# Patient Record
Sex: Female | Born: 1950
Health system: Southern US, Community
[De-identification: ages and names within clinical notes are randomized; demographics above are authoritative.]

## PROBLEM LIST (undated history)

## (undated) DIAGNOSIS — E669 Obesity, unspecified: Secondary | ICD-10-CM

## (undated) DIAGNOSIS — Z8249 Family history of ischemic heart disease and other diseases of the circulatory system: Secondary | ICD-10-CM

## (undated) DIAGNOSIS — R413 Other amnesia: Secondary | ICD-10-CM

## (undated) DIAGNOSIS — I10 Essential (primary) hypertension: Secondary | ICD-10-CM

## (undated) DIAGNOSIS — E7800 Pure hypercholesterolemia, unspecified: Secondary | ICD-10-CM

## (undated) HISTORY — PX: CARPAL TUNNEL RELEASE: SHX101

## (undated) HISTORY — PX: HIP SURGERY: SHX245

## (undated) HISTORY — DX: Other amnesia: R41.3

---

## 1998-12-10 ENCOUNTER — Other Ambulatory Visit: Admission: RE | Admit: 1998-12-10 | Discharge: 1998-12-10 | Payer: Self-pay | Admitting: Family Medicine

## 2000-07-07 ENCOUNTER — Other Ambulatory Visit: Admission: RE | Admit: 2000-07-07 | Discharge: 2000-07-07 | Payer: Self-pay | Admitting: Family Medicine

## 2001-08-23 ENCOUNTER — Other Ambulatory Visit: Admission: RE | Admit: 2001-08-23 | Discharge: 2001-08-23 | Payer: Self-pay | Admitting: Family Medicine

## 2001-08-26 ENCOUNTER — Encounter: Payer: Self-pay | Admitting: Family Medicine

## 2001-08-26 ENCOUNTER — Encounter: Admission: RE | Admit: 2001-08-26 | Discharge: 2001-08-26 | Payer: Self-pay | Admitting: Family Medicine

## 2002-09-08 ENCOUNTER — Other Ambulatory Visit: Admission: RE | Admit: 2002-09-08 | Discharge: 2002-09-08 | Payer: Self-pay | Admitting: Family Medicine

## 2003-10-13 ENCOUNTER — Other Ambulatory Visit: Admission: RE | Admit: 2003-10-13 | Discharge: 2003-10-13 | Payer: Self-pay | Admitting: Family Medicine

## 2004-04-15 ENCOUNTER — Ambulatory Visit: Payer: Self-pay | Admitting: Family Medicine

## 2004-09-07 ENCOUNTER — Ambulatory Visit: Payer: Self-pay | Admitting: Family Medicine

## 2005-01-22 ENCOUNTER — Ambulatory Visit: Payer: Self-pay | Admitting: Family Medicine

## 2005-03-05 ENCOUNTER — Other Ambulatory Visit: Admission: RE | Admit: 2005-03-05 | Discharge: 2005-03-05 | Payer: Self-pay | Admitting: Family Medicine

## 2005-03-05 ENCOUNTER — Ambulatory Visit: Payer: Self-pay | Admitting: Family Medicine

## 2005-04-16 ENCOUNTER — Ambulatory Visit: Payer: Self-pay | Admitting: Internal Medicine

## 2007-02-17 ENCOUNTER — Ambulatory Visit: Payer: Self-pay | Admitting: Family Medicine

## 2007-02-17 LAB — CONVERTED CEMR LAB
AST: 28 units/L (ref 0–37)
Alkaline Phosphatase: 109 units/L (ref 39–117)
BUN: 22 mg/dL (ref 6–23)
Basophils Absolute: 0 10*3/uL (ref 0.0–0.1)
Basophils Relative: 0 % (ref 0.0–1.0)
Bilirubin Urine: NEGATIVE
CO2: 32 meq/L (ref 19–32)
Calcium: 10.3 mg/dL (ref 8.4–10.5)
Chloride: 105 meq/L (ref 96–112)
Cholesterol: 234 mg/dL (ref 0–200)
Direct LDL: 171.6 mg/dL
Eosinophils Absolute: 0.2 10*3/uL (ref 0.0–0.6)
GFR calc Af Amer: 95 mL/min
GFR calc non Af Amer: 79 mL/min
Glucose, Bld: 79 mg/dL (ref 70–99)
Glucose, Urine, Semiquant: NEGATIVE
Lymphocytes Relative: 31.3 % (ref 12.0–46.0)
MCHC: 33 g/dL (ref 30.0–36.0)
MCV: 72.7 fL — ABNORMAL LOW (ref 78.0–100.0)
Monocytes Relative: 7.9 % (ref 3.0–11.0)
Neutro Abs: 4.1 10*3/uL (ref 1.4–7.7)
Potassium: 3.7 meq/L (ref 3.5–5.1)
Total CHOL/HDL Ratio: 4.7
Total Protein: 7.2 g/dL (ref 6.0–8.3)
Triglycerides: 91 mg/dL (ref 0–149)
Urobilinogen, UA: 0.2
VLDL: 18 mg/dL (ref 0–40)
WBC: 7.1 10*3/uL (ref 4.5–10.5)

## 2007-02-18 DIAGNOSIS — I10 Essential (primary) hypertension: Secondary | ICD-10-CM

## 2007-02-18 HISTORY — DX: Essential (primary) hypertension: I10

## 2007-02-22 ENCOUNTER — Other Ambulatory Visit: Admission: RE | Admit: 2007-02-22 | Discharge: 2007-02-22 | Payer: Self-pay | Admitting: Family Medicine

## 2007-02-22 ENCOUNTER — Ambulatory Visit: Payer: Self-pay | Admitting: Family Medicine

## 2007-02-22 ENCOUNTER — Encounter: Payer: Self-pay | Admitting: Family Medicine

## 2007-02-22 DIAGNOSIS — E669 Obesity, unspecified: Secondary | ICD-10-CM | POA: Insufficient documentation

## 2007-02-22 DIAGNOSIS — J309 Allergic rhinitis, unspecified: Secondary | ICD-10-CM

## 2007-02-22 HISTORY — DX: Allergic rhinitis, unspecified: J30.9

## 2007-03-22 ENCOUNTER — Encounter: Payer: Self-pay | Admitting: Family Medicine

## 2007-04-22 ENCOUNTER — Ambulatory Visit: Payer: Self-pay | Admitting: Family Medicine

## 2007-04-22 LAB — CONVERTED CEMR LAB
ALT: 40 units/L — ABNORMAL HIGH (ref 0–35)
AST: 31 units/L (ref 0–37)
Basophils Relative: 0.1 % (ref 0.0–1.0)
Direct LDL: 179.4 mg/dL
Eosinophils Absolute: 0.2 10*3/uL (ref 0.0–0.6)
Eosinophils Relative: 1.9 % (ref 0.0–5.0)
MCHC: 32.3 g/dL (ref 30.0–36.0)
MCV: 73.6 fL — ABNORMAL LOW (ref 78.0–100.0)
Monocytes Absolute: 0.7 10*3/uL (ref 0.2–0.7)
Monocytes Relative: 8.1 % (ref 3.0–11.0)
Neutro Abs: 5.1 10*3/uL (ref 1.4–7.7)
RBC: 5.11 M/uL (ref 3.87–5.11)
Total CHOL/HDL Ratio: 4.1

## 2007-05-11 ENCOUNTER — Ambulatory Visit: Payer: Self-pay | Admitting: Family Medicine

## 2007-07-22 ENCOUNTER — Ambulatory Visit: Payer: Self-pay | Admitting: Family Medicine

## 2007-07-22 DIAGNOSIS — E785 Hyperlipidemia, unspecified: Secondary | ICD-10-CM

## 2007-07-22 HISTORY — DX: Hyperlipidemia, unspecified: E78.5

## 2007-07-22 LAB — CONVERTED CEMR LAB
ALT: 48 units/L — ABNORMAL HIGH (ref 0–35)
Albumin: 3.8 g/dL (ref 3.5–5.2)
Alkaline Phosphatase: 71 units/L (ref 39–117)
Cholesterol: 163 mg/dL (ref 0–200)
Total Bilirubin: 1.1 mg/dL (ref 0.3–1.2)
Total CHOL/HDL Ratio: 3.2
Total Protein: 7 g/dL (ref 6.0–8.3)
Triglycerides: 61 mg/dL (ref 0–149)
VLDL: 12 mg/dL (ref 0–40)

## 2007-07-26 ENCOUNTER — Telehealth: Payer: Self-pay | Admitting: Family Medicine

## 2007-07-27 ENCOUNTER — Telehealth: Payer: Self-pay | Admitting: Family Medicine

## 2007-08-07 ENCOUNTER — Telehealth: Payer: Self-pay | Admitting: Family Medicine

## 2008-01-19 ENCOUNTER — Telehealth (INDEPENDENT_AMBULATORY_CARE_PROVIDER_SITE_OTHER): Payer: Self-pay | Admitting: *Deleted

## 2008-01-26 ENCOUNTER — Telehealth: Payer: Self-pay | Admitting: Family Medicine

## 2008-03-08 ENCOUNTER — Encounter: Admission: RE | Admit: 2008-03-08 | Discharge: 2008-03-08 | Payer: Self-pay | Admitting: Family Medicine

## 2008-03-14 ENCOUNTER — Ambulatory Visit: Payer: Self-pay | Admitting: Gastroenterology

## 2008-03-16 ENCOUNTER — Telehealth: Payer: Self-pay | Admitting: Gastroenterology

## 2008-03-17 ENCOUNTER — Telehealth: Payer: Self-pay | Admitting: Gastroenterology

## 2008-03-20 ENCOUNTER — Telehealth: Payer: Self-pay | Admitting: Gastroenterology

## 2008-03-21 ENCOUNTER — Telehealth: Payer: Self-pay | Admitting: Internal Medicine

## 2008-03-22 ENCOUNTER — Ambulatory Visit: Payer: Self-pay | Admitting: Gastroenterology

## 2008-03-23 ENCOUNTER — Ambulatory Visit: Payer: Self-pay | Admitting: Family Medicine

## 2008-03-23 LAB — CONVERTED CEMR LAB
ALT: 26 units/L (ref 0–35)
BUN: 11 mg/dL (ref 6–23)
Basophils Relative: 0.9 % (ref 0.0–3.0)
Bilirubin, Direct: 0.2 mg/dL (ref 0.0–0.3)
Blood in Urine, dipstick: NEGATIVE
CO2: 34 meq/L — ABNORMAL HIGH (ref 19–32)
Eosinophils Relative: 3.6 % (ref 0.0–5.0)
GFR calc non Af Amer: 78 mL/min
Glucose, Urine, Semiquant: NEGATIVE
HDL: 44.1 mg/dL (ref >39.0)
Hemoglobin: 13 g/dL (ref 12.0–15.0)
Ketones, urine, test strip: NEGATIVE
MCHC: 32.4 g/dL (ref 30.0–36.0)
MCV: 73.9 fL — ABNORMAL LOW (ref 78.0–100.0)
Monocytes Absolute: 0.6 10*3/uL (ref 0.1–1.0)
Potassium: 3.3 meq/L — ABNORMAL LOW (ref 3.5–5.1)
RBC: 5.43 M/uL — ABNORMAL HIGH (ref 3.87–5.11)
Sodium: 144 meq/L (ref 135–145)
TSH: 1.86 microintl units/mL (ref 0.35–5.50)
Total Bilirubin: 1 mg/dL (ref 0.3–1.2)
Total Protein: 7.5 g/dL (ref 6.0–8.3)
Urobilinogen, UA: 1
VLDL: 18 mg/dL (ref 0–40)
WBC: 6.6 10*3/uL (ref 4.5–10.5)
pH: 7

## 2008-03-30 ENCOUNTER — Other Ambulatory Visit: Admission: RE | Admit: 2008-03-30 | Discharge: 2008-03-30 | Payer: Self-pay | Admitting: Family Medicine

## 2008-03-30 ENCOUNTER — Encounter: Payer: Self-pay | Admitting: Family Medicine

## 2008-03-30 ENCOUNTER — Ambulatory Visit: Payer: Self-pay | Admitting: Family Medicine

## 2008-03-30 DIAGNOSIS — M1990 Unspecified osteoarthritis, unspecified site: Secondary | ICD-10-CM | POA: Insufficient documentation

## 2008-03-30 HISTORY — DX: Unspecified osteoarthritis, unspecified site: M19.90

## 2008-04-10 ENCOUNTER — Inpatient Hospital Stay (HOSPITAL_COMMUNITY): Admission: RE | Admit: 2008-04-10 | Discharge: 2008-04-15 | Payer: Self-pay | Admitting: Orthopaedic Surgery

## 2008-05-17 ENCOUNTER — Ambulatory Visit (HOSPITAL_COMMUNITY): Admission: EM | Admit: 2008-05-17 | Discharge: 2008-05-17 | Payer: Self-pay | Admitting: Emergency Medicine

## 2009-03-19 ENCOUNTER — Ambulatory Visit: Payer: Self-pay | Admitting: Family Medicine

## 2009-03-19 ENCOUNTER — Encounter: Admission: RE | Admit: 2009-03-19 | Discharge: 2009-03-19 | Payer: Self-pay | Admitting: Family Medicine

## 2009-03-19 LAB — CONVERTED CEMR LAB
ALT: 37 units/L — ABNORMAL HIGH (ref 0–35)
Alkaline Phosphatase: 77 units/L (ref 39–117)
BUN: 14 mg/dL (ref 6–23)
Basophils Absolute: 0.1 10*3/uL (ref 0.0–0.1)
Basophils Relative: 1.6 % (ref 0.0–3.0)
Chloride: 99 meq/L (ref 96–112)
Creatinine,U: 168.7 mg/dL
Eosinophils Absolute: 0.2 10*3/uL (ref 0.0–0.7)
Eosinophils Relative: 2.4 % (ref 0.0–5.0)
GFR calc non Af Amer: 94.12 mL/min (ref >60)
Glucose, Bld: 97 mg/dL (ref 70–99)
Glucose, Urine, Semiquant: NEGATIVE
HCT: 40 % (ref 36.0–46.0)
HDL: 53.4 mg/dL (ref >39.00)
Hemoglobin: 12.8 g/dL (ref 12.0–15.0)
Hgb A1c MFr Bld: 6 % (ref 4.6–6.5)
Ketones, urine, test strip: NEGATIVE
Lymphs Abs: 1.9 10*3/uL (ref 0.7–4.0)
MCV: 74.5 fL — ABNORMAL LOW (ref 78.0–100.0)
Microalb, Ur: 24.2 mg/dL — ABNORMAL HIGH (ref 0.0–1.9)
Monocytes Absolute: 0.5 10*3/uL (ref 0.1–1.0)
Monocytes Relative: 7 % (ref 3.0–12.0)
Nitrite: NEGATIVE
Platelets: 182 10*3/uL (ref 150.0–400.0)
RBC: 5.38 M/uL — ABNORMAL HIGH (ref 3.87–5.11)
RDW: 14.4 % (ref 11.5–14.6)
Sodium: 144 meq/L (ref 135–145)
Total CHOL/HDL Ratio: 3
Total Protein: 7.7 g/dL (ref 6.0–8.3)
Urobilinogen, UA: 0.2
WBC Urine, dipstick: NEGATIVE
WBC: 6.7 10*3/uL (ref 4.5–10.5)

## 2009-03-27 ENCOUNTER — Encounter: Admission: RE | Admit: 2009-03-27 | Discharge: 2009-03-27 | Payer: Self-pay | Admitting: Family Medicine

## 2009-04-17 ENCOUNTER — Other Ambulatory Visit: Admission: RE | Admit: 2009-04-17 | Discharge: 2009-04-17 | Payer: Self-pay | Admitting: Family Medicine

## 2009-04-17 ENCOUNTER — Ambulatory Visit: Payer: Self-pay | Admitting: Family Medicine

## 2009-04-17 ENCOUNTER — Encounter: Payer: Self-pay | Admitting: Family Medicine

## 2009-04-17 ENCOUNTER — Encounter (INDEPENDENT_AMBULATORY_CARE_PROVIDER_SITE_OTHER): Payer: Self-pay | Admitting: *Deleted

## 2009-04-27 ENCOUNTER — Ambulatory Visit: Payer: Self-pay | Admitting: Family Medicine

## 2010-03-15 ENCOUNTER — Encounter: Admission: RE | Admit: 2010-03-15 | Discharge: 2010-03-15 | Payer: Self-pay | Admitting: Family Medicine

## 2010-05-01 ENCOUNTER — Ambulatory Visit: Payer: Self-pay | Admitting: Family Medicine

## 2010-05-01 LAB — CONVERTED CEMR LAB
ALT: 26 units/L (ref 0–35)
AST: 28 units/L (ref 0–37)
Albumin: 4.1 g/dL (ref 3.5–5.2)
BUN: 14 mg/dL (ref 6–23)
Basophils Absolute: 0.1 10*3/uL (ref 0.0–0.1)
Bilirubin, Direct: 0.2 mg/dL (ref 0.0–0.3)
Blood in Urine, dipstick: NEGATIVE
Cholesterol: 147 mg/dL (ref 0–200)
Creatinine, Ser: 0.89 mg/dL (ref 0.40–1.20)
Eosinophils Relative: 3.8 % (ref 0.0–5.0)
Ketones, urine, test strip: NEGATIVE
Lymphocytes Relative: 32.3 % (ref 12.0–46.0)
MCHC: 32.8 g/dL (ref 30.0–36.0)
Monocytes Absolute: 0.6 10*3/uL (ref 0.1–1.0)
Monocytes Relative: 8 % (ref 3.0–12.0)
Neutro Abs: 4 10*3/uL (ref 1.4–7.7)
Nitrite: NEGATIVE
Platelets: 188 10*3/uL (ref 150.0–400.0)
Potassium: 3.4 meq/L — ABNORMAL LOW (ref 3.5–5.3)
RDW: 15.3 % — ABNORMAL HIGH (ref 11.5–14.6)
Sodium: 142 meq/L (ref 135–145)
Total Bilirubin: 1.1 mg/dL (ref 0.3–1.2)
Total Protein: 7 g/dL (ref 6.0–8.3)
Triglycerides: 67 mg/dL (ref <150)
WBC: 7.2 10*3/uL (ref 4.5–10.5)

## 2010-05-08 ENCOUNTER — Other Ambulatory Visit
Admission: RE | Admit: 2010-05-08 | Discharge: 2010-05-08 | Payer: Self-pay | Source: Home / Self Care | Admitting: Family Medicine

## 2010-05-08 ENCOUNTER — Ambulatory Visit: Payer: Self-pay | Admitting: Family Medicine

## 2010-05-08 ENCOUNTER — Encounter: Payer: Self-pay | Admitting: Family Medicine

## 2010-05-08 LAB — CONVERTED CEMR LAB: Pap Smear: NEGATIVE

## 2010-07-11 NOTE — Assessment & Plan Note (Signed)
Summary: cpx labs v70.0/njr   Nurse Visit   Allergies: 1)  ! Pcn Laboratory Results   Urine Tests  Date/Time Received: May 01, 2010 2:46 PM  Date/Time Reported: May 01, 2010 2:46 PM   Routine Urinalysis   Color: yellow Appearance: Clear Glucose: negative   (Normal Range: Negative) Bilirubin: negative   (Normal Range: Negative) Ketone: negative   (Normal Range: Negative) Spec. Gravity: 1.025   (Normal Range: 1.003-1.035) Blood: negative   (Normal Range: Negative) pH: 6.5   (Normal Range: 5.0-8.0) Protein: 2+   (Normal Range: Negative) Urobilinogen: 0.2   (Normal Range: 0-1) Nitrite: negative   (Normal Range: Negative) Leukocyte Esterace: 1+   (Normal Range: Negative)    Comments: Wynona Canes, CMA  May 01, 2010 2:46 PM     Orders Added: 1)  Venipuncture [21308] 2)  Specimen Handling [99000] 3)  TLB-CBC Platelet - w/Differential [85025-CBCD] 4)  UA Dipstick w/o Micro (automated)  [81003] 5)  T- * Misc. Laboratory test [phone]  Laboratory Results   Urine Tests    Routine Urinalysis   Color: yellow Appearance: Clear Glucose: negative   (Normal Range: Negative) Bilirubin: negative   (Normal Range: Negative) Ketone: negative   (Normal Range: Negative) Spec. Gravity: 1.025   (Normal Range: 1.003-1.035) Blood: negative   (Normal Range: Negative) pH: 6.5   (Normal Range: 5.0-8.0) Protein: 2+   (Normal Range: Negative) Urobilinogen: 0.2   (Normal Range: 0-1) Nitrite: negative   (Normal Range: Negative) Leukocyte Esterace: 1+   (Normal Range: Negative)    Comments: Wynona Canes, CMA  May 01, 2010 2:46 PM

## 2010-07-11 NOTE — Assessment & Plan Note (Signed)
Summary: cpx/njr   Vital Signs:  Patient profile:   60 year old female Menstrual status:  postmenopausal Height:      61.5 inches Weight:      210 pounds BMI:     39.18 Temp:     98.3 degrees F oral BP sitting:   120 / 78  (left arm) Cuff size:   regular  Vitals Entered By: Kern Reap CMA Duncan Dull) (May 08, 2010 10:29 AM) CC: cpx   CC:  cpx.  History of Present Illness: Brooke [name] is a 60 year old female, who was laid off from her job last year, who comes in today for general physical examination because of a history of underlying hypertension and hyperlipidemia and obesity.  She is currently taking Zocor 20 mg nightly for hyperlipidemia, and one aspirin tablet.  Lipids are at goal.  She takes Tenoretic 100 -- 25   also, potassium supplement daily for hypertension//////////////?// she can drop the dose  Her weight continues to be a problem.  She is 210 pounds.  She is not dieting nor is she walking daily.  Despite being out of work.  Review of systems she gets routine eye care, dental care, colonoscopy, and GI 2009 normal, mammography October 2011 normal.  Tetanus 2009, seasonal flu shot today  Allergies: 1)  ! Pcn  Past History:  Past medical, surgical, family and social histories (including risk factors) reviewed, and no changes noted (except as noted below).  Past Medical History: Reviewed history from 03/30/2008 and no changes required. HRT Obese High Cholesterol Hypertension Osteoarthritis left hip  Past Surgical History: Reviewed history from 04/17/2009 and no changes required. CB x2 THR...L 09  Family History: Reviewed history from 02/18/2007 and no changes required. Family History of Colon CA 1st degree relative <60 Family History Kidney disease Family History of Cardiovascular disorder Family History of Respiratory disease  Social History: Reviewed history from 04/17/2009 and no changes required. Occupation: Never Smoked Alcohol  use-no Married  Review of Systems      See HPI       Flu Vaccine Consent Questions     Do you have a history of severe allergic reactions to this vaccine? no    Any prior history of allergic reactions to egg and/or gelatin? no    Do you have a sensitivity to the preservative Thimersol? no    Do you have a past history of Guillan-Barre Syndrome? no    Do you currently have an acute febrile illness? no    Have you ever had a severe reaction to latex? no    Vaccine information given and explained to patient? yes    Are you currently pregnant? no    Lot Number:[id number]   Exp Date:12/07/2010   Site Given Right  Deltoid IM   Physical Exam  General:  Well-developed,well-nourished,in no acute distress; alert,appropriate and cooperative throughout examination Head:  Normocephalic and atraumatic without obvious abnormalities. No apparent alopecia or balding. Eyes:  No corneal or conjunctival inflammation noted. EOMI. Perrla. Funduscopic exam benign, without hemorrhages, exudates or papilledema. Vision grossly normal. Ears:  External ear exam shows no significant lesions or deformities.  Otoscopic examination reveals clear canals, tympanic membranes are intact bilaterally without bulging, retraction, inflammation or discharge. Hearing is grossly normal bilaterally. Nose:  External nasal examination shows no deformity or inflammation. Nasal mucosa are pink and moist without lesions or exudates. Mouth:  Oral mucosa and oropharynx without lesions or exudates.  Teeth in good repair. Neck:  No deformities, masses, or tenderness  noted. Chest Wall:  No deformities, masses, or tenderness noted. Breasts:  No mass, nodules, thickening, tenderness, bulging, retraction, inflamation, nipple discharge or skin changes noted.   Lungs:  Normal respiratory effort, chest expands symmetrically. Lungs are clear to auscultation, no crackles or wheezes. Heart:  Normal rate and regular rhythm. S1 and S2 normal without  gallop, murmur, click, rub or other extra sounds. Abdomen:  Bowel sounds positive,abdomen soft and non-tender without masses, organomegaly or hernias noted. Rectal:  No external abnormalities noted. Normal sphincter tone. No rectal masses or tenderness. Genitalia:  Pelvic Exam:        External: normal female genitalia without lesions or masses        Vagina: normal without lesions or masses        Cervix: normal without lesions or masses        Adnexa: normal bimanual exam without masses or fullness        Uterus: normal by palpation        Pap smear: performed Msk:  No deformity or scoliosis noted of thoracic or lumbar spine.   Pulses:  R and L carotid,radial,femoral,dorsalis pedis and posterior tibial pulses are full and equal bilaterally Extremities:  No clubbing, cyanosis, edema, or deformity noted with normal full range of motion of all joints.   Neurologic:  No cranial nerve deficits noted. Station and gait are normal. Plantar reflexes are down-going bilaterally. DTRs are symmetrical throughout. Sensory, motor and coordinative functions appear intact. Skin:  Intact without suspicious lesions or rashes Cervical Nodes:  No lymphadenopathy noted Axillary Nodes:  No palpable lymphadenopathy Inguinal Nodes:  No significant adenopathy Psych:  Cognition and judgment appear intact. Alert and cooperative with normal attention span and concentration. No apparent delusions, illusions, hallucinations   Impression & Recommendations:  Problem # 1:  HYPERLIPIDEMIA (ICD-272.4) Assessment Improved  Her updated medication list for this problem includes:    Zocor 20 Mg Tabs (Simvastatin) .Marland Kitchen... Take one tab once daily  Orders: EKG w/ Interpretation (93000)  Problem # 2:  OBESITY NOS (ICD-278.00) Assessment: Unchanged  Problem # 3:  HYPERTENSION (ICD-401.9) Assessment: Improved  Her updated medication list for this problem includes:    Atenolol-chlorthalidone 100-25 Mg Tabs  (Atenolol-chlorthalidone) .Marland Kitchen... Take one tab once daily  Orders: EKG w/ Interpretation (93000)  Problem # 4:  PHYSICAL EXAMINATION (ICD-V70.0) Assessment: Unchanged  Orders: EKG w/ Interpretation (93000)  Complete Medication List: 1)  Atenolol-chlorthalidone 100-25 Mg Tabs (Atenolol-chlorthalidone) .... Take one tab once daily 2)  Klor-con M20 20 Meq Tbcr (Potassium chloride crys cr) .... Take one tab once daily 3)  Adult Aspirin Low Strength 81 Mg Tbdp (Aspirin) .... Two times a day 4)  Zyrtec Allergy 10 Mg Tabs (Cetirizine hcl) 5)  Zocor 20 Mg Tabs (Simvastatin) .... Take one tab once daily 6)  Otc Fe  7)  Calcium  8)  Unisom 25 Mg Tabs (Doxylamine succinate (sleep)) .... As needed 9)  Fish Oil 1200 Mg Caps (Omega-3 fatty acids) .... Take one tab once daily 10)  Flax Seed Oil 1000 Mg Caps (Flaxseed (linseed)) .... Once daily 11)  Daily Multi Tabs (Multiple vitamins-minerals) .... Take half in the am and half in the pm 12)  Super B Complex Tabs (B complex-c) .... Take one tab once daily 13)  Vitamin D3 3000 Unit Tabs (Cholecalciferol) .... Once daily 14)  Coq10 30 Mg Caps (Coenzyme q10) .... Once daily 15)  Magnesium 250 Mg Tabs (Magnesium) .... Once daily  Other Orders: Admin 1st Vaccine (16109)  Flu Vaccine 66yrs + (10272)  Patient Instructions: 1)  begin a walking program 20 minutes daily. 2)  Cut your blood pressure medication in half and check your blood pressure daily for 4 weeks in a row.  If your blood pressure stays 135/85 or less than continue   half a tablet a day.  If however, your blood pressure goes up re-start a full tablet daily 3)  Please schedule a follow-up appointment in 1 year. Prescriptions: ZOCOR 20 MG  TABS (SIMVASTATIN) take one tab once daily  #100 x 3   Entered and Authorized by:   Roderick Pee MD   Signed by:   Roderick Pee MD on 05/08/2010   Method used:   Print then Give to Patient   RxID:   [id number] KLOR-CON M20 20 MEQ TBCR  (POTASSIUM CHLORIDE CRYS CR) take one tab once daily  #100 Tablet x 2   Entered and Authorized by:   Roderick Pee MD   Signed by:   Roderick Pee MD on 05/08/2010   Method used:   Print then Give to Patient   RxID:   [id number] ATENOLOL-CHLORTHALIDONE 100-25 MG TABS (ATENOLOL-CHLORTHALIDONE) take one tab once daily  #100 x 2   Entered and Authorized by:   Roderick Pee MD   Signed by:   Roderick Pee MD on 05/08/2010   Method used:   Print then Give to Patient   RxID:   [id number]    Orders Added: 1)  Est. Patient 40-64 years [99396] 2)  EKG w/ Interpretation [93000] 3)  Admin 1st Vaccine [90471] 4)  Flu Vaccine 42yrs + [57322]

## 2010-10-22 NOTE — Op Note (Signed)
Brooke [name], Brooke [name]              ACCOUNT NO.:  [id number]   MEDICAL RECORD NO.:  [id number]          PATIENT TYPE:  INP   LOCATION:  0098                         FACILITY:  [facility]   PHYSICIAN:  Mark C. Ophelia Charter, M.D.    DATE OF BIRTH:  Oct 10, 1950   DATE OF PROCEDURE:  05/17/2008  DATE OF DISCHARGE:  05/17/2008                               OPERATIVE REPORT   PREOPERATIVE DIAGNOSIS:  Second left postop total hip arthroplasty  dislocation.   POSTOPERATIVE DIAGNOSIS:  Second left postop total hip arthroplasty  dislocation.   PROCEDURE:  Closed reduction under anesthesia.   SURGEON:  Annell Greening, MD   ANESTHESIA:  General mask.   BRIEF HISTORY:  This 60 year old female had a hip dislocation 2 to 3  days after her original total hip arthroplasty when she was getting off  the elevated toilet seat after pulling her socks on or off with flexion  of her knee to chest and internal rotation.  She stood up and fell to  the ground.  She is placed in the knee immobilizer and was told to keep  it on at all times.  Today 4 weeks after procedure, she was getting out  of bed, had the knee immobilizer off, swung her legs over the bed at the  same time she flexed up with chest to her knee in order to get out of  bed and dislocated the hip again with severe pain.   PROCEDURE:  After induction of anesthesia with succinylcholine, knee  flexed 90-90 distraction, the hip reduced.  Leg lengths were equal.  She  was flexed back up to 90, posterior pressure applied.  She was stable  with internal rotation to 60 degrees and 90 degrees flexion.  She  posteriorly dislocated again and was easily reduced, brought out to  extension and placed back in a knee immobilizer.  Knee immobilizer is to  stay on for 6 weeks.  Office follow-up in 2 weeks.  Condition at  discharge is stable, postop x-ray showed good position and alignment.      Mark C. Ophelia Charter, M.D.  Electronically Signed     MCY/MEDQ  D:  05/17/2008   T:  05/18/2008  Job:  [id number]

## 2010-10-22 NOTE — Op Note (Signed)
Brooke [name], Brooke [name]              ACCOUNT NO.:  [id number]   MEDICAL RECORD NO.:  [id number]          PATIENT TYPE:  INP   LOCATION:  5029                         FACILITY:  [facility]   PHYSICIAN:  Mark C. Ophelia Charter, M.D.    DATE OF BIRTH:  27-Jul-1950   DATE OF PROCEDURE:  04/10/2008  DATE OF DISCHARGE:                               OPERATIVE REPORT   PREOPERATIVE DIAGNOSIS:  Left hip osteoarthritis.   POSTOPERATIVE DIAGNOSIS:  Left hip osteoarthritis.   PROCEDURE:  Left total hip arthroplasty.   SURGEON:  Mark C. Ophelia Charter, MD   ANESTHESIA:  GOT.   ASSISTANT:  Wende Neighbors, PA-C   ESTIMATED BLOOD LOSS:  300 mL.   DRAINS:  None.   COMPONENTS:  DePuy metal-on-metal 52-mm acetabular shell #4 femur, -2 mm  neck.   PROCEDURE:  After induction of general anesthesia, the patient was  placed in the lateral position with orotracheal intubation and  preoperative vancomycin prophylaxis, hip was prepped with DuraPrep,  usual hip sheets, drapes, impervious stockinette Coban, and sterile skin  marker and Betadine biodrape, sterile drape was applied to sealed the  skin.  Time-out checklist was completed and vancomycin has been given.  Posterior approach was made.  There was about 6 inches of adipose tissue  down to the fascial layer.  Fascia was split in line with the gluteus  maximus muscles and tensor 1-2 cm distal to the greater trochanter.  Piriformis was tagged before it was cut.  Posterior capsule was divided,  small bleeders were controlled with Bovie tip extender.  Large Steinmann  pin was pounded in the acetabulum and a drill bit was placed in the  trochanter parallel to the first pin and measured the distance between  the two, which was 5.10 mm.  After this was done, hip was dislocated,  neck was cut one fingerbreadth above the lesser trochanter.  Femur was  prepared first with canal starter and T-handle reamer, trochanteric  lateralize with cookie cutter and then sequential reaming  and broaching  up to #4.  Canal for her large size was actually very small and would  only allowed #4 femur.  Posterior aspect of the femur was reamed some of  the calcar reamer and then small sponge was then placed after removing  the broach down the canal.  Preparation of the acetabular side was  performed excising the labrum and sequential reaming up to 51 reamer for  52 size.  Metal-on-metal selected and a two-piece metal cut was  selected.  Shell was inserted first followed by the central screw or the  inserted screwed into the acetabular shell.  Corner of the ream was  selected for cup version and there was slight overhang posterior, slight  overhang lateral, and portion of the cup was inside the bone anteriorly  as planned.  A 45 degrees of abduction was selected.  Trial poly was  inserted.  Stability showed flexion to 90, internal rotation to 80  degrees before that could be in a subluxed, no trochanteric impingement,  full extension, stability in external rotation, and quad was not tight.  There was trace shuck.  The +1.5 was extremely tight, was not able to be  reduced even with no twitches on the nerve stimulator by the CRNA.  With  -2 mm neck, the measurement was made with a drill bit and was again 5.2  cm with the exact restoration of her previous leg length.  After  irrigation, permanent cup was inserted with an all-metal liner impacted  into place, checked it was flushed.  Next, femur was inserted followed  by the ball and then reduction of the hip, identical findings of  stability.  After irrigation with saline solution, piriformis and peri-  gluteus medius tendon.  Tensor fascia was repaired with #1 Tycron, #1  Ethibond, and gluteus maximus fascia 0 and 2-0 Vicryl in the fat layers,  subcutaneous tissue and then subcuticular skin closure.  Tincture of  Benzoin, Steri-Strips, postop dressing, and knee immobilizer was  applied.  The patient transferred to supine position and  was then  transferred to recovery room.  Instrument count and needle count was  correct.      Mark C. Ophelia Charter, M.D.  Electronically Signed     MCY/MEDQ  D:  04/10/2008  T:  04/11/2008  Job:  [id number]

## 2010-10-22 NOTE — Op Note (Signed)
NAMELENNYX, VERDELL              ACCOUNT NO.:  [id number]   MEDICAL RECORD NO.:  [id number]          PATIENT TYPE:  INP   LOCATION:  5029                         FACILITY:  [facility]   PHYSICIAN:  Kerrin Champagne, M.D.   DATE OF BIRTH:  03/21/1951   DATE OF PROCEDURE:  04/12/2008  DATE OF DISCHARGE:                               OPERATIVE REPORT   PREOPERATIVE DIAGNOSES:  Acute left total hip replacement and  dislocation.   POSTOPERATIVE DIAGNOSES:  Acute left total hip replacement and  dislocation with posterosuperior dislocation, good stability with  reduction.   PROCEDURES:  Closed reduction and left total hip arthroplasty.   SURGEON:  Kerrin Champagne, MD   ASSISTANT:  None.   ANESTHESIA:  General via orotracheal intubation by Dr. Judie Petit.   FINDINGS:  Left total hip replacement, posterosuperior dislocation, and  good stability.   ESTIMATED BLOOD LOSS:  0 mL.   COMPLICATIONS:  None.   The patient was returned to PACU in good condition.   HISTORY OF PRESENT ILLNESS:  The patient is a 60 year old obese female  who underwent total hip arthroplasty 2 days ago apparently fell today  while on the orthopedic floor.  She had complained of persistent pain  and discomfort in the left shoulder with signs of shortening of the leg  internal rotation and radiographs demonstrate a left posterosuperior  total hip replacement and dislocation.  The hip prosthesis appears to be  a metal-on-metal prosthesis with large head.  The patient is brought to  the operating room to undergo acute closed reduction.   DESCRIPTION OF PROCEDURE:  After adequate general anesthesia and the  patient in her ward bed with pressure applied to the anterosuperior  iliac spine on the left side, left hip was first placed in longitudinal  traction and then attempts at internal rotation on it were reduced.  Therefore, the hip was placed into 90/90 traction with internal rotation  of the hip, then the head  was brought over the cup line, liner into the  cup.  It was reduced without difficulty here with audible and palpable  clunk and her reduction of length.  Leg was then brought into extension  and then replaced through a range of motion 90 degrees without signs of  dislocation, internal rotation nearly 20-30 degrees without signs of  dislocation, external rotation nearly 50 degrees without dislocation.  Returning to the lengths, the lengths were correct, external rotation of  the leg about 15-20 degrees.  Plain radiographs were then obtained of AP  and lateral planes.  AP radiograph demonstrated concentric reduction of  the left total hip arthroplasty.  Lateral demonstrated the cuff in  neutral position.  No significant retroversion noted.  Concentric  reduction present in lateral radiograph as well.  The patient then was  placed into a left knee immobilizer with posterior stays removed, pillow  under the heel to prevent decubitus.  She was then reactivated,  extubated, and returned to recovery room in  satisfactory condition.  Also instruments and sponge counts were  correct.  Note that standard preoperative protocol marking the correct  side was carried out and intraoperative, it was not carried out due to  the respect for time in performing the procedure.      Kerrin Champagne, M.D.  Electronically Signed     JEN/MEDQ  D:  04/12/2008  T:  04/13/2008  Job:  [id number]

## 2010-10-25 NOTE — Discharge Summary (Signed)
Brooke [name], [name]              ACCOUNT NO.:  [id number]   MEDICAL RECORD NO.:  [id number]          PATIENT TYPE:  INP   LOCATION:  5029                         FACILITY:  [facility]   PHYSICIAN:  Brooke [name], M.D.    DATE OF BIRTH:  1950/08/26   DATE OF ADMISSION:  04/10/2008  DATE OF DISCHARGE:  04/15/2008                               DISCHARGE SUMMARY   ADMISSION DIAGNOSES:  1. Osteoarthritis of the left hip.  2. Hypertension.  3. Dyslipidemia.   DISCHARGE DIAGNOSES:  1. Osteoarthritis of the left hip.  2. Hypertension.  3. Dyslipidemia.  4. Left total hip arthroplasty dislocation after a fall in the      hospital room.  5. Posthemorrhagic anemia.  6. Urinary tract infection.  7. Hypokalemia, treated with supplementation.   PROCEDURES:  1. On April 10, 2008, the patient underwent left total hip      arthroplasty performed by Dr. Ophelia [name], assisted by Maud Deed, PA-      C under general anesthesia.  2. On April 12, 2008, the patient underwent closed reduction of left      total hip arthroplasty performed by Dr. Otelia Sergeant under general      anesthesia.   CONSULTATIONS:  None.   BRIEF HISTORY:  The patient is a 60 year old female with chronic and  progressive left hip pain secondary to osteoarthritis.  It was felt she  would benefit from surgical intervention and was admitted for the  procedure as stated above.  Two days after her surgery, she fell in her  room.  She developed pain in the left leg.  She was unable to bear  weight on the left lower extremity.  On examination, she had signs of  shortening of the left lower extremity with internal rotation.  Radiographs showed a left posterosuperior total hip replacement  dislocation.  The patient was then taken to the emergency room to  undergo closed manipulation under general anesthesia.  During her  hospital course, the patient received PCA analgesics for her pain and  was weaned to p.o. analgesics without difficulty.   She was started on  Coumadin for DVT prophylaxis and adjustment in Coumadin dose was made  according to daily protimes by the pharmacist at [facility].  The  patient had a urinalysis which was positive for white blood cells as  well as an elevated white count to 16.4 and elevated temperatures on  April 12, 2008.  She was started on Augmentin 875 mg p.o. b.i.d.  The  patient was encouraged to use incentive spirometry.  Initially  neurovascular motor function was intact until her fall on the second  postoperative day at which time motor function was decreased in the left  lower extremity secondary to pain.  Once the dislocation had been  reduced, the neurovascular motor function returned to normal limits  postoperatively.  She was able to resume physical therapy following the  dislocation.  The patient was placed in a knee immobilizer and advised  to keep this on full time to prevent further dislocation.  She was also  re-educated on total hip replacement  precautions.  She was instructed to  continue with the knee immobilizer for a total of 6 weeks.  The patient  developed hypokalemia, which was treated with supplementation.  On  April 15, 2008, after arrangements for home health physical therapy,  occupational therapy, and durable medical equipment, the patient was  stable for discharge to her home.  Other pertinent laboratory values at  admission, hemoglobin and hematocrit 13.4 and 42.4.  At discharge, 8.6  and 26.7.  WBC elevated at 16.4 and back to 12.4 at discharge.  INR at  discharge 1.9.  Hypokalemia as low as 2.8 and returning at 3.1 after  supplementation.  Urinalysis as stated above.  Urine culture showed no  growth.  EKG on admission sinus bradycardia, nonspecific T-wave  abnormality with no old tracings for comparison.   PLAN:  Arrangements are made for discharge to home.  She will change her  dressing daily.  She will be allowed to shower if there is no wound   drainage.  She will continue to use her knee immobilizer full time.  She  will be weightbearing as tolerated on the operative extremity.  She will  continue to utilize a walker for ambulation.  Follow up with Dr. Ophelia [name]  in 1 week.  She will take Coumadin 5 mg daily with adjustments per  pharmacy.  She was given prescription for iron supplement 1 p.o. b.i.d.,  Tylox 1-2 every 4-6 hours as needed for pain and potassium chloride 40  mEq daily.  The patient's medication reconciliation form was completed  and she was instructed to stop her aspirin, Celebrex, Unisom, and  nabumetone, but resume all of her other home medications.  All questions  are encouraged and answered.   CONDITION ON DISCHARGE:  Stable.      Wende Neighbors, P.A.      Brooke [name], M.D.  Electronically Signed    SMV/MEDQ  D:  05/30/2008  T:  05/30/2008  Job:  [id number]

## 2011-03-11 LAB — CBC
HCT: 42.4
HCT: 26.6 — ABNORMAL LOW
HCT: 26.7 — ABNORMAL LOW
HCT: 28.2 — ABNORMAL LOW
HCT: 29.2 — ABNORMAL LOW
Hemoglobin: 13.4
Hemoglobin: 8.6 — ABNORMAL LOW
Hemoglobin: 8.8 — ABNORMAL LOW
Hemoglobin: 9.5 — ABNORMAL LOW
MCHC: 32.3
MCHC: 33.7
MCV: 73.3 — ABNORMAL LOW
MCV: 73.4 — ABNORMAL LOW
Platelets: 173
Platelets: 174
Platelets: 176
Platelets: 213
RBC: 5.69 — ABNORMAL HIGH
RDW: 16.2 — ABNORMAL HIGH
RDW: 15.4
RDW: 15.6 — ABNORMAL HIGH
RDW: 15.7 — ABNORMAL HIGH
WBC: 9.5
WBC: 9.1

## 2011-03-11 LAB — URINALYSIS, ROUTINE W REFLEX MICROSCOPIC
Glucose, UA: NEGATIVE
Ketones, ur: NEGATIVE
Nitrite: NEGATIVE
Nitrite: NEGATIVE
Protein, ur: 30 — AB
Specific Gravity, Urine: 1.021
Urobilinogen, UA: 1
Urobilinogen, UA: 1

## 2011-03-11 LAB — BASIC METABOLIC PANEL
BUN: 9
CO2: 31
Calcium: 8.7
Calcium: 9.1
Chloride: 99
Creatinine, Ser: 0.87
GFR calc non Af Amer: >60
Glucose, Bld: 130 — ABNORMAL HIGH
Potassium: 3 — ABNORMAL LOW
Sodium: 136

## 2011-03-11 LAB — COMPREHENSIVE METABOLIC PANEL
ALT: 52 — ABNORMAL HIGH
AST: 48 — ABNORMAL HIGH
Albumin: 4.1
Creatinine, Ser: 0.88
GFR calc Af Amer: >60
GFR calc non Af Amer: >60
Total Protein: 7.4

## 2011-03-11 LAB — URINE MICROSCOPIC-ADD ON

## 2011-03-11 LAB — URINE CULTURE
Colony Count: NO GROWTH
Culture: NO GROWTH

## 2011-03-11 LAB — PROTIME-INR
INR: 0.9
INR: 1.1
INR: 1.5
INR: 1.6 — ABNORMAL HIGH
Prothrombin Time: 12.6
Prothrombin Time: 14.2
Prothrombin Time: 19.6 — ABNORMAL HIGH

## 2011-03-14 LAB — CBC
Hemoglobin: 11 g/dL — ABNORMAL LOW (ref 12.0–15.0)
MCHC: 31.3 g/dL (ref 30.0–36.0)
MCV: 74.8 fL — ABNORMAL LOW (ref 78.0–100.0)
RBC: 4.68 MIL/uL (ref 3.87–5.11)
RDW: 17.1 % — ABNORMAL HIGH (ref 11.5–15.5)

## 2011-03-14 LAB — DIFFERENTIAL
Basophils Absolute: 0.1 10*3/uL (ref 0.0–0.1)
Basophils Relative: 1 % (ref 0–1)
Eosinophils Absolute: 0 10*3/uL (ref 0.0–0.7)
Eosinophils Relative: 0 % (ref 0–5)
Monocytes Absolute: 0.6 10*3/uL (ref 0.1–1.0)
Monocytes Relative: 6 % (ref 3–12)

## 2011-03-14 LAB — BASIC METABOLIC PANEL
CO2: 26 mEq/L (ref 19–32)
Calcium: 9.8 mg/dL (ref 8.4–10.5)
Chloride: 104 mEq/L (ref 96–112)
GFR calc Af Amer: >60 mL/min (ref >60)
Glucose, Bld: 151 mg/dL — ABNORMAL HIGH (ref 70–99)
Sodium: 141 mEq/L (ref 135–145)

## 2011-03-22 ENCOUNTER — Other Ambulatory Visit: Payer: Self-pay | Admitting: Family Medicine

## 2011-05-30 ENCOUNTER — Other Ambulatory Visit: Payer: Self-pay | Admitting: Family Medicine

## 2011-08-25 ENCOUNTER — Other Ambulatory Visit: Payer: Self-pay | Admitting: Family Medicine

## 2012-01-28 ENCOUNTER — Other Ambulatory Visit: Payer: Self-pay | Admitting: Family Medicine

## 2012-04-23 ENCOUNTER — Encounter (HOSPITAL_COMMUNITY): Payer: Self-pay

## 2012-04-23 ENCOUNTER — Emergency Department (INDEPENDENT_AMBULATORY_CARE_PROVIDER_SITE_OTHER)
Admission: EM | Admit: 2012-04-23 | Discharge: 2012-04-23 | Disposition: A | Discharge status: Nursing Home (Includes ICF) | Payer: Self-pay | Source: Home / Self Care | Attending: Emergency Medicine | Admitting: Emergency Medicine

## 2012-04-23 ENCOUNTER — Emergency Department (HOSPITAL_COMMUNITY)
Admission: EM | Admit: 2012-04-23 | Discharge: 2012-04-23 | Disposition: A | Discharge status: Home / Self Care | Payer: Self-pay | Attending: Emergency Medicine | Admitting: Emergency Medicine

## 2012-04-23 ENCOUNTER — Encounter (HOSPITAL_COMMUNITY): Payer: Self-pay | Admitting: Family Medicine

## 2012-04-23 ENCOUNTER — Emergency Department (HOSPITAL_COMMUNITY): Payer: Self-pay

## 2012-04-23 DIAGNOSIS — R51 Headache: Secondary | ICD-10-CM

## 2012-04-23 DIAGNOSIS — E7800 Pure hypercholesterolemia, unspecified: Secondary | ICD-10-CM | POA: Insufficient documentation

## 2012-04-23 DIAGNOSIS — Z79899 Other long term (current) drug therapy: Secondary | ICD-10-CM | POA: Insufficient documentation

## 2012-04-23 DIAGNOSIS — I10 Essential (primary) hypertension: Secondary | ICD-10-CM

## 2012-04-23 DIAGNOSIS — Z7982 Long term (current) use of aspirin: Secondary | ICD-10-CM | POA: Insufficient documentation

## 2012-04-23 HISTORY — DX: Pure hypercholesterolemia, unspecified: E78.00

## 2012-04-23 HISTORY — DX: Essential (primary) hypertension: I10

## 2012-04-23 LAB — BASIC METABOLIC PANEL
BUN: 9 mg/dL (ref 6–23)
Calcium: 10 mg/dL (ref 8.4–10.5)
Chloride: 105 mEq/L (ref 96–112)
Creatinine, Ser: 0.68 mg/dL (ref 0.50–1.10)
GFR calc Af Amer: >90 mL/min (ref >90)
GFR calc non Af Amer: >90 mL/min (ref >90)

## 2012-04-23 LAB — CBC WITH DIFFERENTIAL/PLATELET
Basophils Absolute: 0.1 K/uL (ref 0.0–0.1)
Basophils Relative: 1 % (ref 0–1)
Eosinophils Absolute: 0.2 K/uL (ref 0.0–0.7)
Eosinophils Relative: 2 % (ref 0–5)
HCT: 35.9 % — ABNORMAL LOW (ref 36.0–46.0)
Hemoglobin: 11.9 g/dL — ABNORMAL LOW (ref 12.0–15.0)
Lymphocytes Relative: 29 % (ref 12–46)
Lymphs Abs: 2.7 10*3/uL (ref 0.7–4.0)
MCH: 22.8 pg — ABNORMAL LOW (ref 26.0–34.0)
MCHC: 33.1 g/dL (ref 30.0–36.0)
MCV: 68.8 fL — ABNORMAL LOW (ref 78.0–100.0)
Monocytes Absolute: 0.6 K/uL (ref 0.1–1.0)
Monocytes Relative: 6 % (ref 3–12)
Neutro Abs: 5.7 10*3/uL (ref 1.7–7.7)
Neutrophils Relative %: 62 % (ref 43–77)
Platelets: 192 K/uL (ref 150–400)
RBC: 5.22 MIL/uL — ABNORMAL HIGH (ref 3.87–5.11)
RDW: 18.2 % — ABNORMAL HIGH (ref 11.5–15.5)
WBC: 9.3 10*3/uL (ref 4.0–10.5)

## 2012-04-23 LAB — BASIC METABOLIC PANEL WITH GFR
CO2: 23 meq/L (ref 19–32)
Glucose, Bld: 87 mg/dL (ref 70–99)
Potassium: 3.5 meq/L (ref 3.5–5.1)
Sodium: 138 meq/L (ref 135–145)

## 2012-04-23 LAB — POCT I-STAT TROPONIN I: Troponin i, poc: 0 ng/mL (ref 0.00–0.08)

## 2012-04-23 MED ORDER — KETOROLAC TROMETHAMINE 30 MG/ML IJ SOLN
30.0000 mg | Freq: Once | INTRAMUSCULAR | Status: AC
  Administered 2012-04-23: 30 mg via INTRAVENOUS
  Filled 2012-04-23: qty 1

## 2012-04-23 MED ORDER — METOPROLOL TARTRATE 25 MG PO TABS
12.5000 mg | ORAL_TABLET | Freq: Once | ORAL | Status: AC
  Administered 2012-04-23: 12.5 mg via ORAL
  Filled 2012-04-23: qty 1

## 2012-04-23 MED ORDER — METOCLOPRAMIDE HCL 5 MG/ML IJ SOLN
10.0000 mg | Freq: Once | INTRAMUSCULAR | Status: AC
  Administered 2012-04-23: 10 mg via INTRAVENOUS
  Filled 2012-04-23: qty 2

## 2012-04-23 MED ORDER — METOPROLOL TARTRATE 1 MG/ML IV SOLN
2.5000 mg | Freq: Once | INTRAVENOUS | Status: AC
  Administered 2012-04-23: 2.5 mg via INTRAVENOUS
  Filled 2012-04-23: qty 5

## 2012-04-23 MED ORDER — DIPHENHYDRAMINE HCL 50 MG/ML IJ SOLN
12.5000 mg | Freq: Once | INTRAMUSCULAR | Status: AC
  Administered 2012-04-23: 12.5 mg via INTRAVENOUS
  Filled 2012-04-23: qty 1

## 2012-04-23 MED ORDER — ATENOLOL 25 MG PO TABS: 25.0000 mg | ORAL_TABLET | Freq: Every day | ORAL | Status: DC

## 2012-04-23 NOTE — Discharge Instructions (Signed)
Arterial Hypertension °Arterial hypertension (high blood pressure) is a condition of elevated pressure in your blood vessels. Hypertension over a long period of time is a risk factor for strokes, heart attacks, and heart failure. It is also the leading cause of kidney (renal) failure.  °CAUSES  °· In Adults -- Over 90% of all hypertension has no known cause. This is called essential or primary hypertension. In the other 10% of people with hypertension, the increase in blood pressure is caused by another disorder. This is called secondary hypertension. Important causes of secondary hypertension are: °· Heavy alcohol use. °· Obstructive sleep apnea. °· Hyperaldosterosim (Conn's syndrome). °· Steroid use. °· Chronic kidney failure. °· Hyperparathyroidism. °· Medications. °· Renal artery stenosis. °· Pheochromocytoma. °· Cushing's disease. °· Coarctation of the aorta. °· Scleroderma renal crisis. °· Licorice (in excessive amounts). °· Drugs (cocaine, methamphetamine). °Your caregiver can explain any items above that apply to you. °· In Children -- Secondary hypertension is more common and should always be considered. °· Pregnancy -- Few women of childbearing age have high blood pressure. However, up to 10% of them develop hypertension of pregnancy. Generally, this will not harm the woman. It may be a sign of 3 complications of pregnancy: preeclampsia, HELLP syndrome, and eclampsia. Follow up and control with medication is necessary. °SYMPTOMS  °· This condition normally does not produce any noticeable symptoms. It is usually found during a routine exam. °· Malignant hypertension is a late problem of high blood pressure. It may have the following symptoms: °· Headaches. °· Blurred vision. °· End-organ damage (this means your kidneys, heart, lungs, and other organs are being damaged). °· Stressful situations can increase the blood pressure. If a person with normal blood pressure has their blood pressure go up while being  seen by their caregiver, this is often termed "white coat hypertension." Its importance is not known. It may be related with eventually developing hypertension or complications of hypertension. °· Hypertension is often confused with mental tension, stress, and anxiety. °DIAGNOSIS  °The diagnosis is made by 3 separate blood pressure measurements. They are taken at least 1 week apart from each other. If there is organ damage from hypertension, the diagnosis may be made without repeat measurements. °Hypertension is usually identified by having blood pressure readings: °· Above 140/90 mmHg measured in both arms, at 3 separate times, over a couple weeks. °· Over 130/80 mmHg should be considered a risk factor and may require treatment in patients with diabetes. °Blood pressure readings over 120/80 mmHg are called "pre-hypertension" even in non-diabetic patients. °To get a true blood pressure measurement, use the following guidelines. Be aware of the factors that can alter blood pressure readings. °· Take measurements at least 1 hour after caffeine. °· Take measurements 30 minutes after smoking and without any stress. This is another reason to quit smoking  it raises your blood pressure. °· Use a proper cuff size. Ask your caregiver if you are not sure about your cuff size. °· Most home blood pressure cuffs are automatic. They will measure systolic and diastolic pressures. The systolic pressure is the pressure reading at the start of sounds. Diastolic pressure is the pressure at which the sounds disappear. If you are elderly, measure pressures in multiple postures. Try sitting, lying or standing. °· Sit at rest for a minimum of 5 minutes before taking measurements. °· You should not be on any medications like decongestants. These are found in many cold medications. °· Record your blood pressure readings and review   them with your caregiver. °If you have hypertension: °· Your caregiver may do tests to be sure you do not have  secondary hypertension (see "causes" above). °· Your caregiver may also look for signs of metabolic syndrome. This is also called Syndrome X or Insulin Resistance Syndrome. You may have this syndrome if you have type 2 diabetes, abdominal obesity, and abnormal blood lipids in addition to hypertension. °· Your caregiver will take your medical and family history and perform a physical exam. °· Diagnostic tests may include blood tests (for glucose, cholesterol, potassium, and kidney function), a urinalysis, or an EKG. Other tests may also be necessary depending on your condition. °PREVENTION  °There are important lifestyle issues that you can adopt to reduce your chance of developing hypertension: °· Maintain a normal weight. °· Limit the amount of salt (sodium) in your diet. °· Exercise often. °· Limit alcohol intake. °· Get enough potassium in your diet. Discuss specific advice with your caregiver. °· Follow a DASH diet (dietary approaches to stop hypertension). This diet is rich in fruits, vegetables, and low-fat dairy products, and avoids certain fats. °PROGNOSIS  °Essential hypertension cannot be cured. Lifestyle changes and medical treatment can lower blood pressure and reduce complications. The prognosis of secondary hypertension depends on the underlying cause. Many people whose hypertension is controlled with medicine or lifestyle changes can live a normal, healthy life.  °RISKS AND COMPLICATIONS  °While high blood pressure alone is not an illness, it often requires treatment due to its short- and long-term effects on many organs. Hypertension increases your risk for: °· CVAs or strokes (cerebrovascular accident). °· Heart failure due to chronically high blood pressure (hypertensive cardiomyopathy). °· Heart attack (myocardial infarction). °· Damage to the retina (hypertensive retinopathy). °· Kidney failure (hypertensive nephropathy). °Your caregiver can explain list items above that apply to you. Treatment  of hypertension can significantly reduce the risk of complications. °TREATMENT  °· For overweight patients, weight loss and regular exercise are recommended. Physical fitness lowers blood pressure. °· Mild hypertension is usually treated with diet and exercise. A diet rich in fruits and vegetables, fat-free dairy products, and foods low in fat and salt (sodium) can help lower blood pressure. Decreasing salt intake decreases blood pressure in a 1/3 of people. °· Stop smoking if you are a smoker. °The steps above are highly effective in reducing blood pressure. While these actions are easy to suggest, they are difficult to achieve. Most patients with moderate or severe hypertension end up requiring medications to bring their blood pressure down to a normal level. There are several classes of medications for treatment. Blood pressure pills (antihypertensives) will lower blood pressure by their different actions. Lowering the blood pressure by 10 mmHg may decrease the risk of complications by as much as 25%. °The goal of treatment is effective blood pressure control. This will reduce your risk for complications. Your caregiver will help you determine the best treatment for you according to your lifestyle. What is excellent treatment for one person, may not be for you. °HOME CARE INSTRUCTIONS  °· Do not smoke. °· Follow the lifestyle changes outlined in the "Prevention" section. °· If you are on medications, follow the directions carefully. Blood pressure medications must be taken as prescribed. Skipping doses reduces their benefit. It also puts you at risk for problems. °· Follow up with your caregiver, as directed. °· If you are asked to monitor your blood pressure at home, follow the guidelines in the "Diagnosis" section above. °SEEK MEDICAL CARE   HOME CARE INSTRUCTIONS     Do not smoke.   Follow the lifestyle changes outlined in the "Prevention" section.   If you are on medications, follow the directions carefully. Blood pressure medications must be taken as prescribed. Skipping doses reduces their benefit. It also puts you at risk for problems.   Follow up with your caregiver, as directed.   If you are asked to monitor your blood pressure at home, follow the guidelines in the "Diagnosis" section above.  SEEK MEDICAL CARE IF:     You think you are having medication side effects.   You have recurrent headaches or lightheadedness.   You have swelling in your ankles.   You  have trouble with your vision.  SEEK IMMEDIATE MEDICAL CARE IF:     You have sudden onset of chest pain or pressure, difficulty breathing, or other symptoms of a heart attack.   You have a severe headache.   You have symptoms of a stroke (such as sudden weakness, difficulty speaking, difficulty walking).  MAKE SURE YOU:     Understand these instructions.   Will watch your condition.   Will get help right away if you are not doing well or get worse.  Document Released: 05/26/2005 Document Revised: 08/18/2011 Document Reviewed: 12/24/2006  [employer] Patient Information 2013 [employer].

## 2012-04-23 NOTE — ED Notes (Signed)
Pt being taken to c-t 

## 2012-04-23 NOTE — ED Provider Notes (Signed)
Chief Complaint  Patient presents with  . Headache    History of Present Illness:   Brooke [name] is a pleasant 61 year old female who presents this afternoon with a history since 9 AM this morning of a sudden onset of throbbing, 6/10 headache which seems to be centered around the right eye and radiates up to the right forehead and to the top of the head. This has been associated with slight nausea but no vomiting and photophobia but no phonophobia. She had some nasal congestion but denies any fever, chills, sore throat, or cough. She states this is the worst headache of her life. She's not had a prior history of headaches and has no history of migraine headache. She does have a history of high blood pressure and her blood pressure was elevated today. She denies use of alcohol, tobacco, or recreational drugs. She denies any stiff neck or lower back pain. There is no family history of aneurysm or migraine headaches. She denies any diplopia, blurred vision, or scotomata she had no difficulty with speech or swallowing, numbness, tingling, localized muscle weakness, or difficulty with gait, ambulation, or balance.   Review of Systems:  Other than noted above, the patient denies any of the following symptoms: Systemic:  No fever, chills, fatigue, photophobia, stiff neck. Eye:  No redness, eye pain, discharge, blurred vision, or diplopia. ENT:  No nasal congestion, rhinorrhea, sinus pressure or pain, sneezing, earache, or sore throat.  No jaw claudication. Neuro:  No paresthesias, loss of consciousness, seizure activity, muscle weakness, trouble with coordination or gait, trouble speaking or swallowing. Psych:  No depression, anxiety or trouble sleeping.  PMFSH:  Past medical history, family history, social history, meds, and allergies were reviewed.  Physical Exam:   Vital signs:  BP 185/88  Pulse 60  Temp 98.9 F (37.2 C) (Oral)  Resp 20  SpO2 97% General:  Alert and oriented.  In no distress. Eye:   Lids and conjunctivas normal.  Full EOMs.  Pupils were small, measuring 2 mm bilaterally and poorly reactive to light. Fundi were not well seen. ENT:  No cranial or facial tenderness to palpation.  TMs and canals clear.  Nasal mucosa was normal and uncongested without any drainage. No intra oral lesions, pharynx clear, mucous membranes moist, dentition normal. Neck:  Supple, full ROM, no tenderness to palpation.  No adenopathy or mass. Neuro:  Alert and orented times 3.  Speech was clear, fluent, and appropriate.  Cranial nerves intact. No pronator drift, muscle strength normal. Finger to nose normal.  DTRs were 2+ and symmetrical.Station and gait were normal.  Romberg's sign was normal.  Able to perform tandem gait well. Psych:  Normal affect.  Assessment:  The encounter diagnosis was Headache.  Risk factors include a sudden onset of headache, that this is the worst headache of her life, and the fact that she has not had previous headaches in the past. This raises suspicion of an intracranial aneurysm. She will be transported to the emergency room via shuttle since she is stable.  Plan:   1.  The following meds were prescribed:   New Prescriptions   No medications on file   2.  The patient was transported to the emergency room via shuttle in stable condition.   Reuben Likes, MD 04/23/12 [phone]

## 2012-04-23 NOTE — ED Notes (Signed)
To ct

## 2012-04-23 NOTE — ED Notes (Addendum)
Per pt about 9 am this morning started having headache. sts she took her BP and was elevated around 180 sys. sts hasn't had BP meds in 6 months due to insurance reasons.No  One sided weakness, numbess or slurred speech. No facial droop, grips equal

## 2012-04-23 NOTE — ED Provider Notes (Signed)
History     CSN: [id number]  Arrival date & time 04/23/12  1356   First MD Initiated Contact with Patient 04/23/12 1828      Chief Complaint  Patient presents with  . Hypertension  . Headache    (Consider location/radiation/quality/duration/timing/severity/associated sxs/prior treatment) HPI Comments: Pt reports HA onset at 0900 mostly to right side of head.  Pt attributes to not sleeping well last night, did have a cup of coffee later in day.  Pt usually with 1 cup of coffee daily, also some tea throughout day, has not had any caffeine today.  Pt seen at urgent care and then sent here.  No numbness or weakness of arms or legs.  No N/V, no photophobia, stiff neck.  Denies fever, cold symptoms.  Pt has former h/o HTN, took self off of meds about 6 months ago.  Had a flu shot 1 week ago, [facility], her BP was 155/60.  Today, with HA, she took BP at home and was high at 180 and so went to urgent care.  No CP, SOB.  She did take 2 advil with no sig improvement in HA.    Patient is a 61 y.o. female presenting with hypertension and headaches. The history is provided by the patient and a relative.  Hypertension Associated symptoms include headaches. Pertinent negatives include no chest pain, no abdominal pain and no shortness of breath.  Headache  Pertinent negatives include no fever, no shortness of breath, no nausea and no vomiting.    Past Medical History  Diagnosis Date  . Hypertension   . High cholesterol     Past Surgical History  Procedure Date  . Hip surgery     No family history on file.  History  Substance Use Topics  . Smoking status: Never Smoker   . Smokeless tobacco: Not on file  . Alcohol Use: No    OB History    Grav Para Term Preterm Abortions TAB SAB Ect Mult Living                  Review of Systems  Constitutional: Negative for fever and chills.  HENT: Negative for congestion and rhinorrhea.   Eyes: Negative for photophobia.    Respiratory: Negative for chest tightness and shortness of breath.   Cardiovascular: Negative for chest pain.  Gastrointestinal: Negative for nausea, vomiting and abdominal pain.  Genitourinary: Negative for flank pain.  Musculoskeletal: Negative for back pain.  Neurological: Positive for headaches. Negative for dizziness, facial asymmetry, speech difficulty and weakness.  All other systems reviewed and are negative.    Allergies  Penicillins  Home Medications   Current Outpatient Rx  Name  Route  Sig  Dispense  Refill  . ASPIRIN 81 MG PO CHEW   Oral   Chew 81 mg by mouth daily.         Marland Kitchen CALCIUM PO   Oral   Take 1 tablet by mouth daily.         Marland Kitchen FLAX SEEDS PO   Oral   Take 1 capsule by mouth daily.         Marland Kitchen GNP GINGKO BILOBA EXTRACT PO   Oral   Take 1 capsule by mouth daily.         . IBUPROFEN 200 MG PO TABS   Oral   Take 400 mg by mouth every 6 (six) hours as needed. For pain         . ADULT MULTIVITAMIN  W/MINERALS CH   Oral   Take 1 tablet by mouth daily.         Marland Kitchen VITAMIN E PO   Oral   Take 1 capsule by mouth daily.         . ATENOLOL 25 MG PO TABS   Oral   Take 1 tablet (25 mg total) by mouth daily.   30 tablet   0     BP 180/73  Pulse 56  Temp 98.5 F (36.9 C) (Oral)  Resp 18  SpO2 98%  Physical Exam  Nursing note and vitals reviewed. Constitutional: She is oriented to person, place, and time. She appears well-developed and well-nourished.  HENT:  Head: Normocephalic and atraumatic.  Eyes: Pupils are equal, round, and reactive to light.  Neck: Normal range of motion and full passive range of motion without pain. Neck supple. No Brudzinski's sign and no Kernig's sign noted.  Cardiovascular: Normal rate.   Pulmonary/Chest: Effort normal.  Abdominal: Soft.  Neurological: She is alert and oriented to person, place, and time. She has normal strength. No cranial nerve deficit or sensory deficit. Coordination normal. GCS eye  subscore is 4. GCS verbal subscore is 5. GCS motor subscore is 6.       Slight limitation of left lower leg hip strength due to chronic pain related to hip prosthesis  Skin: Skin is warm and dry.  Psychiatric: She has a normal mood and affect.    ED Course  Procedures (including critical care time)  Labs Reviewed  CBC WITH DIFFERENTIAL - Abnormal; Notable for the following:    RBC 5.22 (*)     Hemoglobin 11.9 (*)     HCT 35.9 (*)     MCV 68.8 (*)     MCH 22.8 (*)     RDW 18.2 (*)     All other components within normal limits  BASIC METABOLIC PANEL  POCT I-STAT TROPONIN I   Ct Head Wo Contrast  04/23/2012  *RADIOLOGY REPORT*  Clinical Data:  Right frontal/vertex headache.  CT HEAD WITHOUT CONTRAST  Technique:  Contiguous axial images were obtained from the base of the skull through the vertex without contrast  Comparison:  None.  Findings:  The brain has a normal appearance without evidence for hemorrhage, acute infarction, hydrocephalus, or mass lesion.  There is no extra axial fluid collection.  The skull and paranasal sinuses are normal.  IMPRESSION: Normal CT of the head without contrast.   Original Report Authenticated By: Davonna Belling, M.D.      1. Hypertension   2. Headache     ra sat is 99% and normal by my interpretation.  8:03 PM Pt had margianl improvement of HA with IV meds.  BP remains very elevated at 180 systolic.  Although no definite end organ failure, will give some antihypertensive, beta blocker, to match what pt decidied to discontinue on her own several months ago, plan to restart her on atenolol at low dose, obtain head CT to r/o bleed, mass effect.    10:03 PM Pt did have improvement of HA, although seemed to recur.  Head CT is normal by my interpretation.  Will restart atenolol, pt is agreeable, will go home and follow up with PCP  MDM  No photophobia, stiff neck, HA is on right side, risk may be lack of sleep and no caffeine today.  Will treat HA with HA  cocktail, recheck BP.  Pt sent here from urgent care due to concern for sudden  onset of HA.  No other symptoms or signs suggesting hemorrhagic CVA, SAH. No visual changes, no temporal artery distribution pain or tenderness.          Gavin Pound. Oletta Lamas, MD 04/23/12 2203

## 2012-04-23 NOTE — ED Notes (Signed)
C/o she has had a HA today, and when she checked her BP at home, it was high; c/o she has HA on right side of face

## 2012-04-26 ENCOUNTER — Telehealth: Payer: Self-pay | Admitting: Family Medicine

## 2012-04-26 NOTE — Telephone Encounter (Signed)
Patient calling, had not had her b/p medication in 6 months, couldn't afford an OV with no insurance.  She went to [facility] on Friday and they evaluated her gave her the scripts.  They will see her without insurance or money so she is going to transfer her records there.  I transferred the caller to Medical Records and she left a vm.

## 2012-04-27 ENCOUNTER — Encounter (HOSPITAL_COMMUNITY): Payer: Self-pay | Admitting: Emergency Medicine

## 2012-04-27 ENCOUNTER — Emergency Department (HOSPITAL_COMMUNITY): Payer: Self-pay

## 2012-04-27 ENCOUNTER — Other Ambulatory Visit: Payer: Self-pay

## 2012-04-27 ENCOUNTER — Encounter (HOSPITAL_COMMUNITY): Payer: Self-pay | Admitting: *Deleted

## 2012-04-27 ENCOUNTER — Observation Stay (HOSPITAL_COMMUNITY)
Admission: EM | Admit: 2012-04-27 | Discharge: 2012-04-28 | Disposition: A | Discharge status: Home / Self Care | Payer: Self-pay | Attending: Emergency Medicine | Admitting: Emergency Medicine

## 2012-04-27 ENCOUNTER — Emergency Department (HOSPITAL_COMMUNITY)
Admission: EM | Admit: 2012-04-27 | Discharge: 2012-04-27 | Disposition: A | Discharge status: Nursing Home (Includes ICF) | Payer: Self-pay | Source: Home / Self Care

## 2012-04-27 DIAGNOSIS — E669 Obesity, unspecified: Secondary | ICD-10-CM | POA: Insufficient documentation

## 2012-04-27 DIAGNOSIS — I160 Hypertensive urgency: Secondary | ICD-10-CM

## 2012-04-27 DIAGNOSIS — E7800 Pure hypercholesterolemia, unspecified: Secondary | ICD-10-CM | POA: Insufficient documentation

## 2012-04-27 DIAGNOSIS — Z79899 Other long term (current) drug therapy: Secondary | ICD-10-CM | POA: Insufficient documentation

## 2012-04-27 DIAGNOSIS — Z7982 Long term (current) use of aspirin: Secondary | ICD-10-CM | POA: Insufficient documentation

## 2012-04-27 DIAGNOSIS — R079 Chest pain, unspecified: Principal | ICD-10-CM | POA: Insufficient documentation

## 2012-04-27 DIAGNOSIS — Z8249 Family history of ischemic heart disease and other diseases of the circulatory system: Secondary | ICD-10-CM | POA: Insufficient documentation

## 2012-04-27 DIAGNOSIS — E785 Hyperlipidemia, unspecified: Secondary | ICD-10-CM

## 2012-04-27 DIAGNOSIS — I10 Essential (primary) hypertension: Secondary | ICD-10-CM | POA: Insufficient documentation

## 2012-04-27 DIAGNOSIS — J309 Allergic rhinitis, unspecified: Secondary | ICD-10-CM

## 2012-04-27 HISTORY — DX: Obesity, unspecified: E66.9

## 2012-04-27 HISTORY — DX: Family history of ischemic heart disease and other diseases of the circulatory system: Z82.49

## 2012-04-27 LAB — CBC WITH DIFFERENTIAL/PLATELET
Basophils Relative: 1 % (ref 0–1)
Eosinophils Relative: 3 % (ref 0–5)
HCT: 35.2 % — ABNORMAL LOW (ref 36.0–46.0)
Hemoglobin: 11.4 g/dL — ABNORMAL LOW (ref 12.0–15.0)
Lymphs Abs: 2 10*3/uL (ref 0.7–4.0)
MCV: 69.3 fL — ABNORMAL LOW (ref 78.0–100.0)
Monocytes Relative: 7 % (ref 3–12)
Neutro Abs: 4.9 10*3/uL (ref 1.7–7.7)
Platelets: 163 10*3/uL (ref 150–400)
RBC: 5.08 MIL/uL (ref 3.87–5.11)
WBC: 7.7 10*3/uL (ref 4.0–10.5)

## 2012-04-27 LAB — BASIC METABOLIC PANEL
GFR calc non Af Amer: 88 mL/min — ABNORMAL LOW (ref >90)
Glucose, Bld: 99 mg/dL (ref 70–99)
Potassium: 3.8 mEq/L (ref 3.5–5.1)
Sodium: 140 mEq/L (ref 135–145)

## 2012-04-27 LAB — TROPONIN I: Troponin I: <0.3 ng/mL (ref <0.30)

## 2012-04-27 MED ORDER — GI COCKTAIL ~~LOC~~: 30.0000 mL | Freq: Once | ORAL | Status: DC

## 2012-04-27 MED ORDER — IBUPROFEN 400 MG PO TABS
400.0000 mg | ORAL_TABLET | Freq: Four times a day (QID) | ORAL | Status: DC | PRN
  Administered 2012-04-28: 400 mg via ORAL
  Filled 2012-04-27: qty 1

## 2012-04-27 MED ORDER — ASPIRIN 81 MG PO CHEW
81.0000 mg | CHEWABLE_TABLET | Freq: Once | ORAL | Status: AC
  Administered 2012-04-27: 81 mg via ORAL

## 2012-04-27 MED ORDER — ASPIRIN 81 MG PO CHEW
CHEWABLE_TABLET | ORAL | Status: AC
  Filled 2012-04-27: qty 4

## 2012-04-27 MED ORDER — ACETAMINOPHEN 325 MG PO TABS
650.0000 mg | ORAL_TABLET | Freq: Four times a day (QID) | ORAL | Status: DC | PRN
  Administered 2012-04-27: 650 mg via ORAL
  Filled 2012-04-27: qty 2

## 2012-04-27 MED ORDER — NITROGLYCERIN 0.4 MG SL SUBL
0.4000 mg | SUBLINGUAL_TABLET | Freq: Once | SUBLINGUAL | Status: AC
  Administered 2012-04-27: 0.4 mg via SUBLINGUAL

## 2012-04-27 MED ORDER — NITROGLYCERIN 0.4 MG SL SUBL
SUBLINGUAL_TABLET | SUBLINGUAL | Status: AC
  Filled 2012-04-27: qty 25

## 2012-04-27 MED ORDER — GI COCKTAIL ~~LOC~~
ORAL | Status: AC
  Filled 2012-04-27: qty 30

## 2012-04-27 MED ORDER — DIPHENHYDRAMINE HCL 25 MG PO CAPS: 25.0000 mg | ORAL_CAPSULE | Freq: Once | ORAL | Status: DC

## 2012-04-27 NOTE — ED Provider Notes (Signed)
History     CSN: [id number]  Arrival date & time 04/27/12  4098   None     Chief Complaint  Patient presents with  . Chest Pain    (Consider location/radiation/quality/duration/timing/severity/associated sxs/prior treatment) Patient is a 61 y.o. female presenting with chest pain.  Chest Pain Pertinent negatives for primary symptoms include no fever, no fatigue and no palpitations.  Associated symptoms include diaphoresis.   Chest tightness started yesterday at 3pm while folding clothes, she had eaten some crackers and juice, took some baking soda and water, and alka seltzer and no improvement, continued through the night, described as a pressure in center of chest that does not move, does not radiate, haven't felt well, couldn't sleep and had only minimal relief, woke up this morning and was still present., describes pain as 4/10.  Pt is still having headache, no visual changes, no pressure behind eyes,  Pt has been nauseated and had a poor appetite.  No SOB, no diarrhea and has been under stress.   Past Medical History  Diagnosis Date  . Hypertension   . High cholesterol   . FH: CAD (coronary artery disease)   . Obesity     Past Surgical History  Procedure Date  . Hip surgery   . Carpal tunnel release     bilateral    Family History  Problem Relation Age of Onset  . CAD Sister   . CAD Father   . Kidney disease Sister   . Lung cancer Mother   . Brain cancer Mother     History  Substance Use Topics  . Smoking status: Never Smoker   . Smokeless tobacco: Not on file  . Alcohol Use: No    OB History    Grav Para Term Preterm Abortions TAB SAB Ect Mult Living                 Review of Systems  Constitutional: Positive for diaphoresis, activity change and appetite change. Negative for fever, fatigue and unexpected weight change.  HENT: Negative.   Eyes: Negative.   Respiratory: Negative.   Cardiovascular: Positive for chest pain. Negative for palpitations and  leg swelling.  Gastrointestinal: Negative for abdominal distention.  Musculoskeletal: Negative.   Neurological: Negative.   Hematological: Negative.   Psychiatric/Behavioral: Negative.     Allergies  Penicillins  Home Medications   Current Outpatient Rx  Name  Route  Sig  Dispense  Refill  . ASPIRIN 81 MG PO CHEW   Oral   Chew 81 mg by mouth daily.         . ATENOLOL 25 MG PO TABS   Oral   Take 1 tablet (25 mg total) by mouth daily.   30 tablet   0   . CALCIUM PO   Oral   Take 1 tablet by mouth daily.         Marland Kitchen FLAX SEEDS PO   Oral   Take 1 capsule by mouth daily.         Marland Kitchen GNP GINGKO BILOBA EXTRACT PO   Oral   Take 1 capsule by mouth daily.         . IBUPROFEN 200 MG PO TABS   Oral   Take 400 mg by mouth every 6 (six) hours as needed. For pain         . ADULT MULTIVITAMIN W/MINERALS CH   Oral   Take 1 tablet by mouth daily.         Marland Kitchen  VITAMIN E PO   Oral   Take 1 capsule by mouth daily.           BP 201/78  Pulse 54  Temp 98 F (36.7 C) (Oral)  Resp 22  SpO2 100%  Physical Exam  Constitutional: She is oriented to person, place, and time. She appears well-developed and well-nourished. No distress.  HENT:  Head: Normocephalic and atraumatic.  Eyes: Conjunctivae normal are normal. Pupils are equal, round, and reactive to light. Right eye exhibits no discharge.  Neck: Normal range of motion. Neck supple.  Cardiovascular: Regular rhythm.   Murmur heard.      Bradycardic   Pulmonary/Chest: Effort normal and breath sounds normal.  Abdominal: Soft. Bowel sounds are normal.  Musculoskeletal: Normal range of motion.  Neurological: She is alert and oriented to person, place, and time.  Skin: Skin is warm and dry. No rash noted. She is not diaphoretic. No erythema. No pallor.  Psychiatric: She has a normal mood and affect.    ED Course  Procedures (including critical care time)  Labs Reviewed - No data to display No results  found.   No diagnosis found.    MDM  Chest Pain Bradycardia Hypertensive Urgency Headache Nausea  NTG - chest pain improved after initial NTG treatment O2 ASA  The patient is being sent directly to ER for further evaluation and management.        Cleora Fleet, MD 04/27/12 1555

## 2012-04-27 NOTE — ED Provider Notes (Signed)
Patient in CDU under chest pain protocol.  Patient currently resting comfortably in bed with family at bedside.  Chest pain free, reports headache after SL nitroglycerin.  Lungs CTA bilaterally.  S1/S2, RRR, slightly bradycardic rate.  Abdomen soft, bowel sounds present.  Strong distal pulses palpated all extremities.  Cardiac markers negative.  12 lead reviewed, no indication of ischemia.  Patient is on a beta blocker (atenolol) for blood pressure management, last dose was around 0730 this morning (04/27/12).  Will be sure to withhold tomorrow's dose until after testing is completed.  Patient scheduled for stress echo in AM.  Tylenol ordered for headache.  Jimmye Norman, NP 04/27/12 2100

## 2012-04-27 NOTE — ED Notes (Signed)
Seen at [facility] on Friday for headache and increased bp.  Had not taken medicine for 6 months .  Sent to ed.  Patient had test performed, discharged home with atenolol.  Reports chest tightness started Monday afternoon.  Patient thought related to nervousness.  Also reports indigestion.  Took alka seltzer, belched some ,  This helped indigestion, but no relief of tightness.  Reports slight nausea, denies sob, denies diaphoresis.   Points to center chest as location of tightness

## 2012-04-27 NOTE — ED Notes (Signed)
[facility] does not have a truck 

## 2012-04-27 NOTE — ED Provider Notes (Signed)
History     CSN: [id number]  Arrival date & time 04/27/12  1142   First MD Initiated Contact with Patient 04/27/12 1145      No chief complaint on file.   (Consider location/radiation/quality/duration/timing/severity/associated sxs/prior treatment) HPI  61 year old obese female with history of hypertension and history of hyper cholesterolemia presents for evaluations of chest pain.  Pt reports developing sensation of chest tightness and indigestion since 3pm yesterday after eating some crackers. Tried taking water and baking soda, follow with Alka-Seltzer to treat for indigestion. States it did help with her indigestion but no relief with the chest tightness. She went to sleep but the next day tightness persist. She checked her blood pressure at home and it was 180 systolic.  She f/u at [facility] for evaluation, and was sent here to ER for further evaluation of her chest discomfort.  Pt also received ASA and SL nitro, and currently sts her chest pressure has resolved.  Onset gradual, persistent but improving, mild/moderate in severity, non radiating.  Has family hx of cardiac disease, and has hx of HTN.  Not a smoker, and no hx of diabetes. Reports having cardiac stress test 7-8 yrs ago and it was unremarkable.    Past Medical History  Diagnosis Date  . Hypertension   . High cholesterol   . FH: CAD (coronary artery disease)   . Obesity     Past Surgical History  Procedure Date  . Hip surgery   . Carpal tunnel release     bilateral    Family History  Problem Relation Age of Onset  . CAD Sister   . CAD Father   . Kidney disease Sister   . Lung cancer Mother   . Brain cancer Mother     History  Substance Use Topics  . Smoking status: Never Smoker   . Smokeless tobacco: Not on file  . Alcohol Use: No    OB History    Grav Para Term Preterm Abortions TAB SAB Ect Mult Living                  Review of Systems  All other systems reviewed and are negative.    Allergies    Penicillins  Home Medications   Current Outpatient Rx  Name  Route  Sig  Dispense  Refill  . ASPIRIN 81 MG PO CHEW   Oral   Chew 81 mg by mouth daily.         . ATENOLOL 25 MG PO TABS   Oral   Take 1 tablet (25 mg total) by mouth daily.   30 tablet   0   . CALCIUM PO   Oral   Take 1 tablet by mouth daily.         Marland Kitchen FLAX SEEDS PO   Oral   Take 1 capsule by mouth daily.         Marland Kitchen GNP GINGKO BILOBA EXTRACT PO   Oral   Take 1 capsule by mouth daily.         . IBUPROFEN 200 MG PO TABS   Oral   Take 400 mg by mouth every 6 (six) hours as needed. For pain         . ADULT MULTIVITAMIN W/MINERALS CH   Oral   Take 1 tablet by mouth daily.         Marland Kitchen VITAMIN E PO   Oral   Take 1 capsule by mouth daily.  BP 162/68  Temp 98.7 F (37.1 C) (Oral)  Resp 16  SpO2 100%  Physical Exam  Nursing note and vitals reviewed. Constitutional: She appears well-developed and well-nourished. No distress.       Awake, alert, nontoxic appearance  HENT:  Head: Atraumatic.  Eyes: Conjunctivae normal are normal. Right eye exhibits no discharge. Left eye exhibits no discharge.  Neck: Neck supple.  Cardiovascular: Normal rate and regular rhythm.  Exam reveals no gallop and no friction rub.   No murmur heard. Pulmonary/Chest: Effort normal. No respiratory distress. She exhibits no tenderness.  Abdominal: Soft. There is no tenderness. There is no rebound.  Musculoskeletal: She exhibits no edema and no tenderness.       ROM appears intact, no obvious focal weakness  Neurological:       Mental status and motor strength appears intact  Skin: No rash noted.  Psychiatric: She has a normal mood and affect.    ED Course  Procedures (including critical care time)  Labs Reviewed - No data to display No results found.   No diagnosis found.   Date: 04/27/2012  Rate: 47  Rhythm: sinus bradycardia  QRS Axis: normal  Intervals: normal  ST/T Wave abnormalities:  normal  Conduction Disutrbances:none  Narrative Interpretation:   Old EKG Reviewed: unchanged  Results for orders placed during the hospital encounter of 04/27/12  CBC WITH DIFFERENTIAL      Component Value Range   WBC 7.7  4.0 - 10.5 K/uL   RBC 5.08  3.87 - 5.11 MIL/uL   Hemoglobin 11.4 (*) 12.0 - 15.0 g/dL   HCT 16.1 (*) 09.6 - 04.5 %   MCV 69.3 (*) 78.0 - 100.0 fL   MCH 22.4 (*) 26.0 - 34.0 pg   MCHC 32.4  30.0 - 36.0 g/dL   RDW 40.9 (*) 81.1 - 91.4 %   Platelets 163  150 - 400 K/uL   Neutrophils Relative 63  43 - 77 %   Lymphocytes Relative 26  12 - 46 %   Monocytes Relative 7  3 - 12 %   Eosinophils Relative 3  0 - 5 %   Basophils Relative 1  0 - 1 %   Neutro Abs 4.9  1.7 - 7.7 K/uL   Lymphs Abs 2.0  0.7 - 4.0 K/uL   Monocytes Absolute 0.5  0.1 - 1.0 K/uL   Eosinophils Absolute 0.2  0.0 - 0.7 K/uL   Basophils Absolute 0.1  0.0 - 0.1 K/uL   RBC Morphology ELLIPTOCYTES     WBC Morphology ATYPICAL LYMPHOCYTES     Smear Review LARGE PLATELETS PRESENT    BASIC METABOLIC PANEL      Component Value Range   Sodium 140  135 - 145 mEq/L   Potassium 3.8  3.5 - 5.1 mEq/L   Chloride 104  96 - 112 mEq/L   CO2 26  19 - 32 mEq/L   Glucose, Bld 99  70 - 99 mg/dL   BUN 11  6 - 23 mg/dL   Creatinine, Ser 7.82  0.50 - 1.10 mg/dL   Calcium 95.6  8.4 - 21.3 mg/dL   GFR calc non Af Amer 88 (*) >90 mL/min   GFR calc Af Amer >90  >90 mL/min  TROPONIN I      Component Value Range   Troponin I <0.30  <0.30 ng/mL   Dg Chest 2 View  04/27/2012  *RADIOLOGY REPORT*  Clinical Data: Left-sided chest pain.  CHEST - 2 VIEW  Comparison:  04/07/2008  Findings:  The heart size and mediastinal contours are within normal limits.  Both lungs are clear.  The visualized skeletal structures are unremarkable.  IMPRESSION: No active cardiopulmonary disease.   Original Report Authenticated By: Myles Rosenthal, M.D.    Ct Head Wo Contrast  04/23/2012  *RADIOLOGY REPORT*  Clinical Data:  Right frontal/vertex  headache.  CT HEAD WITHOUT CONTRAST  Technique:  Contiguous axial images were obtained from the base of the skull through the vertex without contrast  Comparison:  None.  Findings:  The brain has a normal appearance without evidence for hemorrhage, acute infarction, hydrocephalus, or mass lesion.  There is no extra axial fluid collection.  The skull and paranasal sinuses are normal.  IMPRESSION: Normal CT of the head without contrast.   Original Report Authenticated By: Davonna Belling, M.D.     1. Chest pain 2. Hypertensive urgency  MDM  Pt presents with CP, currently CP free.  TIMI II, PERC negative.  Pt is appropriate for CPP with treadmill test as she exceed the BMI limit for Coronary CT (Height 5'2, Weight 210lbs, BMI 38)  2:22 PM Currently chest pain free.  Initial work up unremarkable.  My attending will evaluate pt.  Pt will be moved to CDU for CPP.  Pt aware of plan.   3:13 PM Care discussed with CDU provider, Felicie Morn, NP who will continue care.      Fayrene Helper, PA-C 04/27/12 1514

## 2012-04-27 NOTE — ED Notes (Signed)
Pt placed on 2L O2 by [place]; Heart Monitor: rate 57 bpm.

## 2012-04-27 NOTE — ED Notes (Signed)
[facility] coming for patient

## 2012-04-27 NOTE — ED Provider Notes (Signed)
Medical screening examination/treatment/procedure(s) were performed by non-physician practitioner and as supervising physician I was immediately available for consultation/collaboration.   Charles B. Bernette Mayers, MD 04/27/12 [phone]

## 2012-04-27 NOTE — ED Notes (Signed)
Pt requested to have something to help her rest at night. Pt stated " I take advil pm at home to help me rest/ Notified Onalee Hua, NP

## 2012-04-27 NOTE — ED Notes (Signed)
Notified ed

## 2012-04-27 NOTE — ED Notes (Signed)
Sent from [facility] with chest pain.  Complains of HA 6/10 from NTG.  HX of HTN and seen here Friday for same.  Patient was given 324 mg ASA and 1 NTG given.  Patient denies pain in chest at present.

## 2012-04-27 NOTE — ED Provider Notes (Signed)
Medical screening examination/treatment/procedure(s) were performed by non-physician practitioner and as supervising physician I was immediately available for consultation/collaboration.   Lyanne Co, MD 04/27/12 [phone]

## 2012-04-28 DIAGNOSIS — R072 Precordial pain: Secondary | ICD-10-CM

## 2012-04-28 NOTE — ED Provider Notes (Signed)
Assumed care of patient in the CDU.  Patient is currently on the Chest Pain Protocol.  Plan is for patient to have Cardiac Stress Echo this morning.  Reassessed patient.  He is resting comfortably at this time.  Patient alert and orientated x 3, Heart RRR, Lungs CTAB, Abdomen soft and nontender, 2+ pulses in all extremities, no peripheral edema.   9:49 AM Results of the Stress Echo as follows.  Stress echo results: Left ventricular ejection fraction was normal at rest and with stress. There was no echocardiographic evidence for stress-induced ischemia.  Discussed results of the Stress Echo with patient.  Will discharge patient home.  Pascal Lux [place], PA-C 04/28/12 2126

## 2012-04-28 NOTE — Progress Notes (Signed)
  Echocardiogram Echocardiogram Stress Test has been performed.  Cathie Beams 04/28/2012, 9:19 AM

## 2012-04-28 NOTE — ED Notes (Addendum)
Pt ambulated to restroom without assistance.  Pt remains pain and symptom free.

## 2012-04-28 NOTE — ED Notes (Signed)
Pt states chest pain has been relieved. Pt co headache and was given 400mg  ibuprofen.

## 2012-04-29 NOTE — ED Provider Notes (Signed)
Medical screening examination/treatment/procedure(s) were performed by non-physician practitioner and as supervising physician I was immediately available for consultation/collaboration.   Carleene Cooper III, MD 04/29/12 [phone]

## 2012-07-19 ENCOUNTER — Telehealth: Payer: Self-pay | Admitting: Family Medicine

## 2012-07-19 ENCOUNTER — Emergency Department (HOSPITAL_COMMUNITY)
Admission: EM | Admit: 2012-07-19 | Discharge: 2012-07-19 | Disposition: A | Discharge status: Home / Self Care | Payer: BC Managed Care – PPO | Source: Home / Self Care | Attending: Emergency Medicine | Admitting: Emergency Medicine

## 2012-07-19 ENCOUNTER — Encounter (HOSPITAL_COMMUNITY): Payer: Self-pay | Admitting: *Deleted

## 2012-07-19 DIAGNOSIS — I10 Essential (primary) hypertension: Secondary | ICD-10-CM

## 2012-07-19 DIAGNOSIS — E876 Hypokalemia: Secondary | ICD-10-CM

## 2012-07-19 LAB — POCT I-STAT, CHEM 8
Glucose, Bld: 100 mg/dL — ABNORMAL HIGH (ref 70–99)
HCT: 39 % (ref 36.0–46.0)
Hemoglobin: 13.3 g/dL (ref 12.0–15.0)
Potassium: 3.1 mEq/L — ABNORMAL LOW (ref 3.5–5.1)
Sodium: 143 mEq/L (ref 135–145)

## 2012-07-19 MED ORDER — POTASSIUM CHLORIDE ER 10 MEQ PO TBCR: 10.0000 meq | EXTENDED_RELEASE_TABLET | Freq: Two times a day (BID) | ORAL | Status: DC

## 2012-07-19 MED ORDER — AMLODIPINE BESYLATE 10 MG PO TABS: 10.0000 mg | ORAL_TABLET | Freq: Every day | ORAL | Status: DC

## 2012-07-19 MED ORDER — TRIAMTERENE-HCTZ 37.5-25 MG PO TABS: 1.0000 | ORAL_TABLET | Freq: Every day | ORAL | Status: DC

## 2012-07-19 NOTE — ED Provider Notes (Signed)
Chief Complaint  Patient presents with  . Hypertension    History of Present Illness:   Brooke [name] is a 63 year old female who has had a several year history of high blood pressure. She was being seen by Dr. Alonza Smoker and treated with a 10 wall and the diuretic. She lost her insurance in November and was unable to get her prescription medication filled. She's not been taking anything for blood pressure since then. Recently she's had some headache, but she's been under stress due to death in the family. She denies any blurry vision, chest pain, shortness of breath, ankle edema, or neurological symptoms. She went to the bedside today give blood and was turned away because of elevated blood pressure 214/100. She has a history of hyperlipidemia. She denies any history of diabetes, kidney disease, stroke, or cardiac disease.  Review of Systems:  Other than noted above, the patient denies any of the following symptoms: Systemic:  No fever, chills, fatigue, weight loss or gain. Respiratory:  No coughing, wheezing, or shortness of breath. Cardiac:  No chest pain, tightness, pressure, palpitations, syncope, or edema. Neuro:  No headache, dizziness, blurred vision, weakness, paresthesias, or strokelike symptoms.  PMFSH:  Past medical history, family history, social history, meds, and allergies were reviewed.  Physical Exam:   Vital signs:  BP 200/110  Pulse 80  Resp 20  SpO2 99% General:  Alert, oriented, in no distress. Lungs:  Breath sounds clear and equal bilaterally.  No wheezes, rales, or rhonchi. Heart:  Regular rhythm, no gallops, murmers, clicks or rubs.  Abdomen:  Soft and flat.  Nontender, no organomegaly or mass.  No pulsatile midline abdominal mass or bruit. Ext:  No edema, pulses full.  Labs:   Results for orders placed during the hospital encounter of 07/19/12  POCT I-STAT, CHEM 8      Result Value Range   Sodium 143  135 - 145 mEq/L   Potassium 3.1 (*) 3.5 - 5.1 mEq/L   Chloride 109  96 - 112 mEq/L   BUN 8  6 - 23 mg/dL   Creatinine, Ser 1.61  0.50 - 1.10 mg/dL   Glucose, Bld 096 (*) 70 - 99 mg/dL   Calcium, Ion 0.45 (*) 1.13 - 1.30 mmol/L   TCO2 27  0 - 100 mmol/L   Hemoglobin 13.3  12.0 - 15.0 g/dL   HCT 40.9  81.1 - 91.4 %    Assessment:  The primary encounter diagnosis was Hypertension. A diagnosis of Hypokalemia was also pertinent to this visit.  The diagnosis of hypertension with hypokalemia, having been off of her medications for 2 months suggests a possible secondary cause for hypertension such as hyperaldosteronism.  Plan:   1.  The following meds were prescribed:   Discharge Medication List as of 07/19/2012  6:57 PM    START taking these medications   Details  amLODipine (NORVASC) 10 MG tablet Take 1 tablet (10 mg total) by mouth daily., Starting 07/19/2012, Until Discontinued, Normal    potassium chloride (K-DUR) 10 MEQ tablet Take 1 tablet (10 mEq total) by mouth 2 (two) times daily., Starting 07/19/2012, Until Discontinued, Normal    triamterene-hydrochlorothiazide (MAXZIDE-25) 37.5-25 MG per tablet Take 1 each (1 tablet total) by mouth daily., Starting 07/19/2012, Until Discontinued, Normal       2.  The patient was instructed in symptomatic care and handouts were given. 3.  The patient was told to return if becoming worse in any way, if no better in  3 or 4 days, and given some red flag symptoms that would indicate earlier return.    Reuben Likes, MD 07/19/12 [phone]

## 2012-07-19 NOTE — Telephone Encounter (Signed)
Patient Information:  Caller Name: L[place]ricia  Phone: [phone]  Patient: Brooke [name], Brooke [name]  Gender: Female  DOB: [birth year]  Age: 62 Years  PCP: K[facility])  Office Follow Up:  Does the office need to follow up with this patient?: No  Instructions For The Office: N/A  RN Note:  Pt was giving blood, BP 214/100 w/ severe Headache. Advised Pt to go to ED, Pt doesn't want to go to ED, consulted w/ Dr Nelida Meuse nurse, RN also agreed w/ Triage RN, reassured Pt BP this high is dangerous and should be evaluated at ED.  Advised Pt to have someone drive her.  Pt verbalized understanding.  Symptoms  Reason For Call & Symptoms: ER CALL. Blood Pressure 214/100  Reviewed Health History In EMR: N/A  Reviewed Medications In EMR: N/A  Reviewed Allergies In EMR: N/A  Reviewed Surgeries / Procedures: N/A  Date of Onset of Symptoms: 07/19/2012  Guideline(s) Used:  High Blood Pressure  Disposition Per Guideline:   Go to ED Now (or to Office with PCP Approval)  Reason For Disposition Reached:   BP > 160 / 100 and any cardiac or neurologic symptoms (e.g., chest pain, difficulty breathing, unsteady gait, blurred vision)  Advice Given:  N/A

## 2012-07-19 NOTE — ED Notes (Signed)
Pt   Was    About  To  Give  Blood  Today  And  Her bp  Was  Elevated       At that time  - she  Has  Been out of her  bp    meds x  sev  Months   -  She  alledges  To  Have  A  Headache          Was  By pcp  To  Be  evaulated   She  Ambulated  With a  Steady  Fluid  Gait    She is  Awake  As  Well  As  Alert and  Oriented

## 2012-08-05 ENCOUNTER — Other Ambulatory Visit (HOSPITAL_COMMUNITY)
Admission: RE | Admit: 2012-08-05 | Discharge: 2012-08-05 | Disposition: A | Discharge status: Home / Self Care | Payer: BC Managed Care – PPO | Source: Ambulatory Visit | Attending: Family Medicine | Admitting: Family Medicine

## 2012-08-05 ENCOUNTER — Encounter: Payer: Self-pay | Admitting: Family Medicine

## 2012-08-05 ENCOUNTER — Ambulatory Visit (INDEPENDENT_AMBULATORY_CARE_PROVIDER_SITE_OTHER): Payer: BC Managed Care – PPO | Admitting: Family Medicine

## 2012-08-05 VITALS — BP 130/90 | Temp 98.8°F | Ht 61.0 in | Wt 181.0 lb

## 2012-08-05 DIAGNOSIS — M1990 Unspecified osteoarthritis, unspecified site: Secondary | ICD-10-CM

## 2012-08-05 DIAGNOSIS — E669 Obesity, unspecified: Secondary | ICD-10-CM

## 2012-08-05 DIAGNOSIS — Z01419 Encounter for gynecological examination (general) (routine) without abnormal findings: Secondary | ICD-10-CM | POA: Insufficient documentation

## 2012-08-05 DIAGNOSIS — E785 Hyperlipidemia, unspecified: Secondary | ICD-10-CM

## 2012-08-05 DIAGNOSIS — Z23 Encounter for immunization: Secondary | ICD-10-CM

## 2012-08-05 DIAGNOSIS — I10 Essential (primary) hypertension: Secondary | ICD-10-CM

## 2012-08-05 LAB — CBC WITH DIFFERENTIAL/PLATELET
Basophils Relative: 1.3 % (ref 0.0–3.0)
Eosinophils Absolute: 0.3 10*3/uL (ref 0.0–0.7)
HCT: 40.2 % (ref 36.0–46.0)
Hemoglobin: 13.1 g/dL (ref 12.0–15.0)
Lymphocytes Relative: 17.4 % (ref 12.0–46.0)
Lymphs Abs: 1.4 10*3/uL (ref 0.7–4.0)
MCHC: 32.6 g/dL (ref 30.0–36.0)
Monocytes Relative: 10.6 % (ref 3.0–12.0)
Neutro Abs: 5.2 10*3/uL (ref 1.4–7.7)
RBC: 5.71 Mil/uL — ABNORMAL HIGH (ref 3.87–5.11)

## 2012-08-05 LAB — LIPID PANEL
HDL: 58.6 mg/dL (ref >39.00)
Triglycerides: 86 mg/dL (ref 0.0–149.0)

## 2012-08-05 LAB — HEPATIC FUNCTION PANEL
ALT: 21 U/L (ref 0–35)
Bilirubin, Direct: 0 mg/dL (ref 0.0–0.3)
Total Protein: 8 g/dL (ref 6.0–8.3)

## 2012-08-05 LAB — POCT URINALYSIS DIPSTICK
Bilirubin, UA: NEGATIVE
Blood, UA: NEGATIVE
Glucose, UA: NEGATIVE
Spec Grav, UA: 1.02
pH, UA: 6.5

## 2012-08-05 LAB — BASIC METABOLIC PANEL
CO2: 26 mEq/L (ref 19–32)
Glucose, Bld: 93 mg/dL (ref 70–99)
Potassium: 3.3 mEq/L — ABNORMAL LOW (ref 3.5–5.1)
Sodium: 137 mEq/L (ref 135–145)

## 2012-08-05 MED ORDER — ATENOLOL 25 MG PO TABS: 25.0000 mg | ORAL_TABLET | Freq: Every day | ORAL | Status: DC

## 2012-08-05 MED ORDER — TRIAMTERENE-HCTZ 37.5-25 MG PO TABS: 1.0000 | ORAL_TABLET | Freq: Every day | ORAL | Status: DC

## 2012-08-05 MED ORDER — AMLODIPINE BESYLATE 10 MG PO TABS: 10.0000 mg | ORAL_TABLET | Freq: Every day | ORAL | Status: DC

## 2012-08-05 MED ORDER — POTASSIUM CHLORIDE ER 10 MEQ PO TBCR: 10.0000 meq | EXTENDED_RELEASE_TABLET | Freq: Two times a day (BID) | ORAL | Status: DC

## 2012-08-05 NOTE — Progress Notes (Signed)
  Subjective:    Patient ID: Brooke [name], female    DOB: April 21, 1951, 62 y.o.   MRN: [id number]  HPI Brooke [name] is a 62 year old married female recently laid off and now helps with her sister who is a nursing home requiring dialysis 3 times weekly who comes in today for physical examination because of underlying hypertension. She's a nonsmoker  She takes Norvasc 10 mg, Tenormin 25 mg, potassium 20 mEq, Maxide 1 daily for hypertension. In the fall she ran out of her medication and didn't call. Her blood pressure 1 October 2 100 she went to the emergency room. Fortunately she did not have a stroke. She was placed back on her medication and her blood pressures been in the 130/80 range at home.  She states she's lost weight but according to our records her weight is fairly stable. She's going yourself on 3 different scales. Advised her to get a home scale and weigh herself everyday at home for a month. Her weight on her home scales is 200 pounds her weight here is 181  She gets routine eye care, dental care, BSE monthly, and you mammography, colonoscopy 2010, tetanus 2009, flu shot 2013, Pneumovax today, information given on shingles   Review of Systems  Constitutional: Negative.   HENT: Negative.   Eyes: Negative.   Respiratory: Negative.   Cardiovascular: Negative.   Gastrointestinal: Negative.   Genitourinary: Negative.   Musculoskeletal: Negative.   Neurological: Negative.   Psychiatric/Behavioral: Negative.        Objective:   Physical Exam  Constitutional: She appears well-developed and well-nourished.  HENT:  Head: Normocephalic and atraumatic.  Right Ear: External ear normal.  Left Ear: External ear normal.  Nose: Nose normal.  Mouth/Throat: Oropharynx is clear and moist.  Eyes: EOM are normal. Pupils are equal, round, and reactive to light.  Neck: Normal range of motion. Neck supple. No thyromegaly present.  Cardiovascular: Normal rate, regular rhythm, normal heart sounds  and intact distal pulses.  Exam reveals no gallop and no friction rub.   No murmur heard. Pulmonary/Chest: Effort normal and breath sounds normal.  Abdominal: Soft. Bowel sounds are normal. She exhibits no distension and no mass. There is no tenderness. There is no rebound.  Genitourinary: Uterus normal. Guaiac negative stool. No vaginal discharge found.  Bilateral breast exam normal  Very dry vagina cervix appears normal however when I did her Pap smear she started spotting  Musculoskeletal: Normal range of motion.  Lymphadenopathy:    She has no cervical adenopathy.  Neurological: She is alert. She has normal reflexes. No cranial nerve deficit. She exhibits normal muscle tone. Coordination normal.  Skin: Skin is warm and dry.  Total body skin exam normal  Psychiatric: She has a normal mood and affect. Her behavior is normal. Judgment and thought content normal.          Assessment & Plan:  Healthy female  Hypertension at goal continue current therapy  Overweight begin diet exercise program  Question weight loss...........Marland Kitchenpurchasea new   scales weigh herself every day if after a month he weight is still concerning come back for followup  History of hyperlipidemia check lipids

## 2012-08-05 NOTE — Patient Instructions (Signed)
For your blood pressure continue the Norvasc, Tenormin, Maxzide, and potassium  Also take one aspirin tablet daily  Stop all the other roots herbs and supplements.  Purchase a new set of scales and weight yourself daily in the morning,,,,,,,,,,,,,, fax this year weights in one month  Walk 30 minutes daily  No salt  Breast exam monthly  Check on the shingles vaccine  Return in one year sooner if any problems

## 2012-08-09 ENCOUNTER — Telehealth: Payer: Self-pay | Admitting: Family Medicine

## 2012-08-09 NOTE — Telephone Encounter (Signed)
Pt would like for you to call her. She states she received a script and not sure it is for her.

## 2012-08-09 NOTE — Telephone Encounter (Signed)
Spoke with patient and clarified daily medications.  She will monitor her blood pressure and call back with any concerns.

## 2012-08-17 ENCOUNTER — Other Ambulatory Visit: Payer: Self-pay | Admitting: Family Medicine

## 2012-08-17 DIAGNOSIS — R799 Abnormal finding of blood chemistry, unspecified: Secondary | ICD-10-CM

## 2012-08-24 ENCOUNTER — Other Ambulatory Visit (INDEPENDENT_AMBULATORY_CARE_PROVIDER_SITE_OTHER): Payer: BC Managed Care – PPO

## 2012-08-24 ENCOUNTER — Other Ambulatory Visit: Payer: BC Managed Care – PPO

## 2012-08-24 DIAGNOSIS — R799 Abnormal finding of blood chemistry, unspecified: Secondary | ICD-10-CM

## 2012-08-24 DIAGNOSIS — R7989 Other specified abnormal findings of blood chemistry: Secondary | ICD-10-CM

## 2012-08-24 LAB — BASIC METABOLIC PANEL
Calcium: 9.4 mg/dL (ref 8.4–10.5)
Creatinine, Ser: 1 mg/dL (ref 0.4–1.2)
GFR: 69.52 mL/min (ref >60.00)
Sodium: 138 mEq/L (ref 135–145)

## 2012-09-14 ENCOUNTER — Other Ambulatory Visit: Payer: BC Managed Care – PPO

## 2012-09-21 ENCOUNTER — Encounter: Payer: BC Managed Care – PPO | Admitting: Family Medicine

## 2012-11-29 ENCOUNTER — Telehealth: Payer: Self-pay | Admitting: *Deleted

## 2012-11-29 NOTE — Telephone Encounter (Signed)
Patient is calling for lab results and to find out if she should start her Maxzide?

## 2012-11-30 NOTE — Telephone Encounter (Signed)
Ok to start medication per Dr Tawanna Cooler.  Spoke with husband

## 2013-09-12 ENCOUNTER — Other Ambulatory Visit: Payer: Self-pay | Admitting: Family Medicine

## 2013-10-12 ENCOUNTER — Telehealth: Payer: Self-pay | Admitting: Family Medicine

## 2013-10-12 ENCOUNTER — Ambulatory Visit (INDEPENDENT_AMBULATORY_CARE_PROVIDER_SITE_OTHER): Payer: BC Managed Care – PPO | Admitting: Family Medicine

## 2013-10-12 ENCOUNTER — Encounter: Payer: Self-pay | Admitting: Family Medicine

## 2013-10-12 VITALS — BP 120/80 | Temp 98.2°F | Ht 61.0 in | Wt 192.0 lb

## 2013-10-12 DIAGNOSIS — M1990 Unspecified osteoarthritis, unspecified site: Secondary | ICD-10-CM

## 2013-10-12 DIAGNOSIS — I10 Essential (primary) hypertension: Secondary | ICD-10-CM

## 2013-10-12 DIAGNOSIS — Z2911 Encounter for prophylactic immunotherapy for respiratory syncytial virus (RSV): Secondary | ICD-10-CM

## 2013-10-12 DIAGNOSIS — D509 Iron deficiency anemia, unspecified: Secondary | ICD-10-CM

## 2013-10-12 DIAGNOSIS — E785 Hyperlipidemia, unspecified: Secondary | ICD-10-CM

## 2013-10-12 DIAGNOSIS — Z23 Encounter for immunization: Secondary | ICD-10-CM

## 2013-10-12 DIAGNOSIS — D649 Anemia, unspecified: Secondary | ICD-10-CM

## 2013-10-12 DIAGNOSIS — E669 Obesity, unspecified: Secondary | ICD-10-CM

## 2013-10-12 HISTORY — DX: Iron deficiency anemia, unspecified: D50.9

## 2013-10-12 LAB — POCT URINALYSIS DIPSTICK
Bilirubin, UA: NEGATIVE
GLUCOSE UA: NEGATIVE
KETONES UA: NEGATIVE
Nitrite, UA: NEGATIVE
RBC UA: NEGATIVE
SPEC GRAV UA: 1.02
UROBILINOGEN UA: 0.2
pH, UA: 5

## 2013-10-12 LAB — CBC WITH DIFFERENTIAL/PLATELET
BASOS PCT: 0.8 % (ref 0.0–3.0)
Basophils Absolute: 0.1 10*3/uL (ref 0.0–0.1)
EOS ABS: 0.2 10*3/uL (ref 0.0–0.7)
EOS PCT: 3.1 % (ref 0.0–5.0)
HCT: 35.9 % — ABNORMAL LOW (ref 36.0–46.0)
HEMOGLOBIN: 11.5 g/dL — AB (ref 12.0–15.0)
LYMPHS PCT: 24.7 % (ref 12.0–46.0)
Lymphs Abs: 1.8 10*3/uL (ref 0.7–4.0)
MCHC: 32 g/dL (ref 30.0–36.0)
MCV: 73.8 fl — AB (ref 78.0–100.0)
MONO ABS: 0.5 10*3/uL (ref 0.1–1.0)
Monocytes Relative: 7.5 % (ref 3.0–12.0)
NEUTROS ABS: 4.7 10*3/uL (ref 1.4–7.7)
Neutrophils Relative %: 63.9 % (ref 43.0–77.0)
Platelets: 192 10*3/uL (ref 150.0–400.0)
RBC: 4.86 Mil/uL (ref 3.87–5.11)
RDW: 15.9 % — ABNORMAL HIGH (ref 11.5–15.5)
WBC: 7.3 10*3/uL (ref 4.0–10.5)

## 2013-10-12 LAB — BASIC METABOLIC PANEL
BUN: 15 mg/dL (ref 6–23)
CALCIUM: 10 mg/dL (ref 8.4–10.5)
CHLORIDE: 102 meq/L (ref 96–112)
CO2: 28 meq/L (ref 19–32)
Creatinine, Ser: 1 mg/dL (ref 0.4–1.2)
GFR: 68.5 mL/min (ref >60.00)
GLUCOSE: 99 mg/dL (ref 70–99)
Potassium: 3.2 mEq/L — ABNORMAL LOW (ref 3.5–5.1)
SODIUM: 140 meq/L (ref 135–145)

## 2013-10-12 LAB — FERRITIN: FERRITIN: 19.1 ng/mL (ref 10.0–291.0)

## 2013-10-12 LAB — HEPATIC FUNCTION PANEL
ALT: 18 U/L (ref 0–35)
AST: 27 U/L (ref 0–37)
Albumin: 3.8 g/dL (ref 3.5–5.2)
Alkaline Phosphatase: 74 U/L (ref 39–117)
BILIRUBIN DIRECT: 0 mg/dL (ref 0.0–0.3)
BILIRUBIN TOTAL: 0.8 mg/dL (ref 0.2–1.2)
Total Protein: 7.5 g/dL (ref 6.0–8.3)

## 2013-10-12 LAB — IRON: Iron: 68 ug/dL (ref 42–145)

## 2013-10-12 LAB — LIPID PANEL
Cholesterol: 215 mg/dL — ABNORMAL HIGH (ref 0–200)
HDL: 54.9 mg/dL (ref >39.00)
LDL CALC: 143 mg/dL — AB (ref 0–99)
Total CHOL/HDL Ratio: 4
Triglycerides: 85 mg/dL (ref 0.0–149.0)
VLDL: 17 mg/dL (ref 0.0–40.0)

## 2013-10-12 LAB — VITAMIN B12: Vitamin B-12: 583 pg/mL (ref 211–911)

## 2013-10-12 LAB — TSH: TSH: 1.74 u[IU]/mL (ref 0.35–4.50)

## 2013-10-12 MED ORDER — ATENOLOL 25 MG PO TABS: ORAL_TABLET | ORAL | Status: DC

## 2013-10-12 MED ORDER — AMLODIPINE BESYLATE 10 MG PO TABS: 10.0000 mg | ORAL_TABLET | Freq: Every day | ORAL | Status: DC

## 2013-10-12 MED ORDER — TRIAMTERENE-HCTZ 37.5-25 MG PO TABS: 1.0000 | ORAL_TABLET | Freq: Every day | ORAL | Status: DC

## 2013-10-12 MED ORDER — POTASSIUM CHLORIDE ER 10 MEQ PO TBCR: 10.0000 meq | EXTENDED_RELEASE_TABLET | Freq: Two times a day (BID) | ORAL | Status: DC

## 2013-10-12 NOTE — Progress Notes (Signed)
Pre visit review using our clinic review tool, if applicable. No additional management support is needed unless otherwise documented below in the visit note. 

## 2013-10-12 NOTE — Telephone Encounter (Signed)
Relevant patient education assigned to patient using Emmi. ° °

## 2013-10-12 NOTE — Progress Notes (Signed)
   Subjective:    Patient ID: Brooke [name], female    DOB: 11-20-1950, 63 y.o.   MRN: [id number]  HPI Brooke [name] is a 63 year old married female nonsmoker who comes in today for general physical examination because of a history of hypertension, osteoarthritis, obesity weight 192 pounds,  She gets routine eye care, no regular dental care. I advised regular dental care, she does BSE monthly gets annual mammography. Colonoscopy 2010 normal  Vaccinations updated by Apolonio Schneiders  She finds it difficult to walk on a daily basis because for joint pain and weight.  She states she donated blood twice in the past 6 months. The last time she when her hemoglobin was low.   Review of Systems  Constitutional: Negative.   HENT: Negative.   Eyes: Negative.   Respiratory: Negative.   Cardiovascular: Negative.   Gastrointestinal: Negative.   Genitourinary: Negative.   Musculoskeletal: Negative.   Neurological: Negative.   Psychiatric/Behavioral: Negative.        Objective:   Physical Exam  Nursing note and vitals reviewed. Constitutional: She appears well-developed and well-nourished.  HENT:  Head: Normocephalic and atraumatic.  Right Ear: External ear normal.  Left Ear: External ear normal.  Nose: Nose normal.  Mouth/Throat: Oropharynx is clear and moist.  Eyes: EOM are normal. Pupils are equal, round, and reactive to light.  Neck: Normal range of motion. Neck supple. No thyromegaly present.  Cardiovascular: Normal rate, regular rhythm, normal heart sounds and intact distal pulses.  Exam reveals no gallop and no friction rub.   No murmur heard. Pulmonary/Chest: Effort normal and breath sounds normal.  Abdominal: Soft. Bowel sounds are normal. She exhibits no distension and no mass. There is no tenderness. There is no rebound.  Genitourinary:  Bilateral breast exam normal  Pelvic deferred no GYN symptoms  Musculoskeletal: Normal range of motion.  Lymphadenopathy:    She has no cervical  adenopathy.  Neurological: She is alert. She has normal reflexes. No cranial nerve deficit. She exhibits normal muscle tone. Coordination normal.  Skin: Skin is warm and dry.  Psychiatric: She has a normal mood and affect. Her behavior is normal. Judgment and thought content normal.          Assessment & Plan:  Healthy female  Obesity......... again stressed diet exercise and weight loss  Hypertension at goal continue current therapy  History of anemia begin workup

## 2013-10-12 NOTE — Patient Instructions (Signed)
Continue your current medications  We will call you your lab work  Begin a diet and exercise program,,,,,,,,, walk 30 minutes daily,

## 2014-10-20 ENCOUNTER — Telehealth: Payer: Self-pay | Admitting: *Deleted

## 2014-10-20 NOTE — Telephone Encounter (Signed)
Left a message with husband for patient to return our call to document mammogram result or schedule a mammogram.

## 2014-10-23 ENCOUNTER — Other Ambulatory Visit: Payer: Self-pay | Admitting: Family Medicine

## 2014-10-23 DIAGNOSIS — Z1239 Encounter for other screening for malignant neoplasm of breast: Secondary | ICD-10-CM

## 2014-11-04 ENCOUNTER — Encounter (HOSPITAL_COMMUNITY): Payer: Self-pay | Admitting: Emergency Medicine

## 2014-11-04 ENCOUNTER — Emergency Department (HOSPITAL_COMMUNITY)
Admission: EM | Admit: 2014-11-04 | Discharge: 2014-11-04 | Disposition: A | Discharge status: Home / Self Care | Payer: Medicare Other | Source: Home / Self Care | Attending: Family Medicine | Admitting: Family Medicine

## 2014-11-04 DIAGNOSIS — B029 Zoster without complications: Secondary | ICD-10-CM

## 2014-11-04 MED ORDER — ACYCLOVIR 5 % EX OINT: 1.0000 "application " | TOPICAL_OINTMENT | CUTANEOUS | Status: DC

## 2014-11-04 NOTE — ED Notes (Signed)
Pt states that she believes she was bitten by a spider on her right thigh

## 2014-11-04 NOTE — ED Provider Notes (Signed)
CSN: [id number]     Arrival date & time 11/04/14  1239 History   First MD Initiated Contact with Patient 11/04/14 1422     Chief Complaint  Patient presents with  . Insect Bite   (Consider location/radiation/quality/duration/timing/severity/associated sxs/prior Treatment) HPI Comments: 64 year old female presents with what she thought was and was told was a spider bite to the right distal thigh. Approximate 6 days ago she noticed that there was some soreness to the distal anterior thigh and sensitivity to the skin. A couple days later she noticed a pustular type lesion followed by a few more some in groups to the area of the pain. These lesions are tender but not especially painful.  She denies known history of the varicella. She does state she has a history of cold sores.   Past Medical History  Diagnosis Date  . Hypertension   . High cholesterol   . FH: CAD (coronary artery disease)   . Obesity    Past Surgical History  Procedure Laterality Date  . Hip surgery    . Carpal tunnel release      bilateral   Family History  Problem Relation Age of Onset  . CAD Sister   . CAD Father   . Kidney disease Sister   . Lung cancer Mother   . Brain cancer Mother    History  Substance Use Topics  . Smoking status: Never Smoker   . Smokeless tobacco: Not on file  . Alcohol Use: No   OB History    No data available     Review of Systems  Constitutional: Negative.   HENT: Negative.   Respiratory: Negative.   Gastrointestinal: Positive for abdominal distention.  Musculoskeletal: Negative.   Skin: Positive for rash.  Neurological: Negative.     Allergies  Penicillins  Home Medications   Prior to Admission medications   Medication Sig Start Date End Date Taking? Authorizing Provider  amLODipine (NORVASC) 10 MG tablet Take 1 tablet (10 mg total) by mouth daily. 10/12/13   Dorena Cookey, MD  aspirin 81 MG chewable tablet Chew 81 mg by mouth daily.    Historical Provider, MD   atenolol (TENORMIN) 25 MG tablet TAKE ONE TABLET BY MOUTH ONCE DAILY 10/12/13   Dorena Cookey, MD  ibuprofen (ADVIL,MOTRIN) 200 MG tablet Take 400 mg by mouth every 6 (six) hours as needed. For pain    Historical Provider, MD  potassium chloride (K-DUR) 10 MEQ tablet Take 1 tablet (10 mEq total) by mouth 2 (two) times daily. 10/12/13   Dorena Cookey, MD  triamterene-hydrochlorothiazide (MAXZIDE-25) 37.5-25 MG per tablet Take 1 tablet by mouth daily. 10/12/13   Dorena Cookey, MD   BP 147/67 mmHg  Pulse 71  Temp(Src) 98.1 F (36.7 C) (Oral)  Resp 16  SpO2 98% Physical Exam  Constitutional: She is oriented to person, place, and time. She appears well-developed and well-nourished. No distress.  Neck: Normal range of motion. Neck supple.  Cardiovascular: Normal rate.   Pulmonary/Chest: Effort normal. No respiratory distress.  Musculoskeletal: She exhibits no edema.  Neurological: She is alert and oriented to person, place, and time. She exhibits normal muscle tone.  Skin: Skin is warm and dry. Rash noted.  There is a small patch of papular vesicular lesions over a red base located to the distal thigh. Surrounding this patch are 3 separate isolated vesicular lesions over a red base. These areas are mildly tender but not especially painful.  Psychiatric: She has a  normal mood and affect.  Nursing note and vitals reviewed.   ED Course  Procedures (including critical care time) Labs Review Labs Reviewed - No data to display  Imaging Review No results found.   MDM   1. Herpes zoster   Vs Herpes Simplex  You have a herpes type infection to your leg, possibly shingles or Herpes Simplex It is too late to start oral therapy but the ointment may help.      Janne Napoleon, NP 11/04/14 [phone]

## 2014-11-04 NOTE — Discharge Instructions (Signed)
Shingles You have a herpes type infection to your leg, possibly shingles or Herpes Simplex It is too late to start oral therapy but the ointment may help.  Shingles is caused by the same virus that causes chickenpox. The first feelings may be pain or tingling. A rash will follow in a couple days. The rash may occur on any area of the body. Long-lasting pain is more likely in an elderly person. It can last months to years. There are medicines that can help prevent pain if you start taking them early. HOME CARE   Take cool baths or place cool cloths on the rash as told by your doctor.  Take medicine only as told by your doctor.  Rest as told by your doctor.  Keep your rash clean with mild soap and cool water or as told by your doctor.  Do not scratch your rash. You may use calamine lotion to relieve itchy skin as told by your doctor.  Keep your rash covered with a loose bandage (dressing).  Avoid touching:  Babies.  Pregnant women.  Children with inflamed skin (eczema).  People who have gotten organ transplants.  People with chronic illnesses, such as leukemia or AIDS.  Wear loose-fitting clothing.  If the rash is on the face, you may need to see a specialist. Keep all appointments. Shingles must be kept away from the eyes, if possible.  Keep all follow-up visits as told by your doctor. GET HELP RIGHT AWAY IF:   You have any pain on the face or eye.  You lose feeling on one side of your face.  You have ear pain or ringing in your ear.  You cannot taste as well.  Your medicines do not help the pain.  Your redness or puffiness (swelling) spreads.  You feel like you are getting worse.  You have a fever. MAKE SURE YOU:   Understand these instructions.  Will watch your condition.  Will get help right away if you are not doing well or get worse. Document Released: 11/12/2007 Document Revised: 10/10/2013 Document Reviewed: 11/12/2007 [facility] Patient Information  2015 [place], [place]. This information is not intended to replace advice given to you by your health care provider. Make sure you discuss any questions you have with your health care provider.

## 2014-11-04 NOTE — ED Notes (Signed)
Left hand/knuckle swelling x 3 days

## 2014-11-08 ENCOUNTER — Encounter: Payer: Self-pay | Admitting: Adult Health

## 2014-11-08 ENCOUNTER — Ambulatory Visit (INDEPENDENT_AMBULATORY_CARE_PROVIDER_SITE_OTHER): Payer: Medicare Other | Admitting: Adult Health

## 2014-11-08 VITALS — BP 124/80 | Temp 98.9°F | Ht 61.0 in | Wt 197.7 lb

## 2014-11-08 DIAGNOSIS — L02415 Cutaneous abscess of right lower limb: Secondary | ICD-10-CM

## 2014-11-08 MED ORDER — SULFAMETHOXAZOLE-TRIMETHOPRIM 800-160 MG PO TABS: 1.0000 | ORAL_TABLET | Freq: Two times a day (BID) | ORAL | Status: DC

## 2014-11-08 NOTE — Progress Notes (Signed)
Pre visit review using our clinic review tool, if applicable. No additional management support is needed unless otherwise documented below in the visit note. 

## 2014-11-08 NOTE — Patient Instructions (Signed)
I am not convinced that this was shingles. I will let you know what the culture shows. In the mean time, please take the Bactrim as ordered. If you notice any new eruptions or signs of infection, please let me know.

## 2014-11-08 NOTE — Progress Notes (Signed)
Subjective:    Patient ID: Brooke [name], female    DOB: [birth year], 64 y.o.   MRN: [id number]  HPI  Patient was seen in the ER on 11/04/2014 and was diagnosed with shingles on her right inner leg. She was not given antivirals due to the time frame of noticing the bumps and seeking treatment. She was prescribed acylovir cream but could not afford it. She presents to the office today with an increase in the size of these "bumps" she endorses very little pain around the site, no itching or neuropathic pain. There is no spreading.   Review of Systems  Constitutional: Positive for fever.  Skin: Positive for wound.  All other systems reviewed and are negative.  Past Medical History  Diagnosis Date  . Hypertension   . High cholesterol   . FH: CAD (coronary artery disease)   . Obesity     History   Social History  . Marital Status: Married    Spouse Name: N/A  . Number of Children: 2  . Years of Education: 12th    Occupational History  . 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 Smoking status: Never Smoker   . Smokeless tobacco: Not on file  . Alcohol Use: No  . Drug Use: No  . Sexual Activity: Not on file   Other Topics Concern  . 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            Past Surgical History  Procedure Laterality Date  . Hip surgery    . Carpal tunnel release      bilateral    Family History  Problem Relation Age of Onset  . CAD Sister   . CAD Father   . Kidney disease Sister   . Lung cancer Mother   . Brain cancer Mother     Allergies  Allergen Reactions  . Penicillins Hives    Current Outpatient Prescriptions on File Prior to Visit  Medication Sig Dispense Refill  . amLODipine (NORVASC) 10 MG tablet Take 1 tablet (10 mg total) by mouth daily. 100 tablet 3  . aspirin 81 MG chewable tablet Chew 81 mg by mouth daily.    Marland Kitchen  atenolol (TENORMIN) 25 MG tablet TAKE ONE TABLET BY MOUTH ONCE DAILY 100 tablet 3  . ibuprofen (ADVIL,MOTRIN) 200 MG tablet Take 400 mg by mouth every 6 (six) hours as needed. For pain    . potassium chloride (K-DUR) 10 MEQ tablet Take 1 tablet (10 mEq total) by mouth 2 (two) times daily. 200 tablet 3  . triamterene-hydrochlorothiazide (MAXZIDE-25) 37.5-25 MG per tablet Take 1 tablet by mouth daily. 100 tablet 3  . acyclovir ointment (ZOVIRAX) 5 % Apply 1 application topically every 3 (three) hours. (Patient not taking: Reported on 11/08/2014) 15 g 0   No current facility-administered medications on file prior to visit.    BP 124/80 mmHg  Temp(Src) 98.9 F (37.2 C) (Oral)  Ht 5\' 1"  (1.549 m)  Wt 197 lb 11.2 oz (89.676 kg)  BMI 37.37 kg/m2       Objective:   Physical Exam  Constitutional: She is oriented to person, place, and time. She appears well-developed and well-nourished. No distress.  Neurological: She is alert and oriented to person, place, and time.  Skin: Skin is warm and dry. She is not  diaphoretic.  Skin eruptions noticed on right inner leg. They are larger than the typical shingles blister. They also appear to be pus filled. Appears bacterial rather than viral.   Psychiatric: She has a normal mood and affect. Her behavior is normal. Judgment and thought content normal.  Nursing note and vitals reviewed.     Assessment & Plan:  1. Cutaneous abscess of right lower extremity - All 5 blisters were opened with small poke of 20 g needle. Pus was removed - Wound culture - sulfamethoxazole-trimethoprim (BACTRIM DS) 800-160 MG per tablet; Take 1 tablet by mouth 2 (two) times daily.  Dispense: 14 tablet; Refill: 0 - Will follow up once results from culture are back.

## 2014-11-11 LAB — WOUND CULTURE
Gram Stain: NONE SEEN
Gram Stain: NONE SEEN
Gram Stain: NONE SEEN
Organism ID, Bacteria: NO GROWTH

## 2014-12-31 ENCOUNTER — Other Ambulatory Visit: Payer: Self-pay | Admitting: Family Medicine

## 2015-01-11 ENCOUNTER — Other Ambulatory Visit: Payer: Medicare Other

## 2015-01-16 ENCOUNTER — Encounter: Payer: Medicare Other | Admitting: Adult Health

## 2015-03-12 DIAGNOSIS — H04213 Epiphora due to excess lacrimation, bilateral lacrimal glands: Secondary | ICD-10-CM | POA: Diagnosis not present

## 2015-03-12 DIAGNOSIS — H04309 Unspecified dacryocystitis of unspecified lacrimal passage: Secondary | ICD-10-CM | POA: Diagnosis not present

## 2015-03-15 ENCOUNTER — Inpatient Hospital Stay: Admission: RE | Admit: 2015-03-15 | Payer: Medicare Other | Source: Ambulatory Visit

## 2015-03-15 DIAGNOSIS — H04221 Epiphora due to insufficient drainage, right lacrimal gland: Secondary | ICD-10-CM | POA: Diagnosis not present

## 2015-03-16 ENCOUNTER — Ambulatory Visit
Admission: RE | Admit: 2015-03-16 | Discharge: 2015-03-16 | Disposition: A | Discharge status: Home / Self Care | Payer: Medicare Other | Source: Ambulatory Visit | Attending: Family Medicine | Admitting: Family Medicine

## 2015-03-16 DIAGNOSIS — Z1231 Encounter for screening mammogram for malignant neoplasm of breast: Secondary | ICD-10-CM | POA: Diagnosis not present

## 2015-03-16 DIAGNOSIS — Z1239 Encounter for other screening for malignant neoplasm of breast: Secondary | ICD-10-CM

## 2015-03-30 ENCOUNTER — Ambulatory Visit (INDEPENDENT_AMBULATORY_CARE_PROVIDER_SITE_OTHER): Payer: Medicare Other | Admitting: *Deleted

## 2015-03-30 DIAGNOSIS — Z23 Encounter for immunization: Secondary | ICD-10-CM

## 2015-04-10 DIAGNOSIS — H35371 Puckering of macula, right eye: Secondary | ICD-10-CM | POA: Diagnosis not present

## 2015-04-10 DIAGNOSIS — H04213 Epiphora due to excess lacrimation, bilateral lacrimal glands: Secondary | ICD-10-CM | POA: Diagnosis not present

## 2015-04-10 DIAGNOSIS — H04309 Unspecified dacryocystitis of unspecified lacrimal passage: Secondary | ICD-10-CM | POA: Diagnosis not present

## 2015-04-10 DIAGNOSIS — H35372 Puckering of macula, left eye: Secondary | ICD-10-CM | POA: Diagnosis not present

## 2015-04-10 DIAGNOSIS — H04221 Epiphora due to insufficient drainage, right lacrimal gland: Secondary | ICD-10-CM | POA: Diagnosis not present

## 2015-04-13 ENCOUNTER — Other Ambulatory Visit: Payer: Self-pay | Admitting: Family Medicine

## 2015-04-13 MED ORDER — ATENOLOL 25 MG PO TABS: 25.0000 mg | ORAL_TABLET | Freq: Every day | ORAL | Status: DC

## 2015-04-13 MED ORDER — AMLODIPINE BESYLATE 10 MG PO TABS
10.0000 mg | ORAL_TABLET | Freq: Every day | ORAL | Status: DC
Start: 2015-04-13 — End: 2015-05-07

## 2015-04-13 MED ORDER — POTASSIUM CHLORIDE ER 10 MEQ PO TBCR: 10.0000 meq | EXTENDED_RELEASE_TABLET | Freq: Two times a day (BID) | ORAL | Status: DC

## 2015-04-13 MED ORDER — TRIAMTERENE-HCTZ 37.5-25 MG PO TABS: 1.0000 | ORAL_TABLET | Freq: Every day | ORAL | Status: DC

## 2015-04-18 ENCOUNTER — Telehealth: Payer: Self-pay | Admitting: Family Medicine

## 2015-04-18 NOTE — Telephone Encounter (Signed)
Pt would like to est w/cory and have cpx before end of year. Can I sch?

## 2015-04-19 NOTE — Telephone Encounter (Signed)
As of right now there's no physical slots open however there's 30 min slot. Can I use one?

## 2015-04-19 NOTE — Telephone Encounter (Signed)
That is fine with me if I have any physical spots open before the end of the year

## 2015-04-20 NOTE — Telephone Encounter (Signed)
Ok to work in.

## 2015-04-23 NOTE — Telephone Encounter (Signed)
Pt has made an appt with dr burns

## 2015-05-07 ENCOUNTER — Ambulatory Visit (INDEPENDENT_AMBULATORY_CARE_PROVIDER_SITE_OTHER): Payer: Medicare Other | Admitting: Internal Medicine

## 2015-05-07 ENCOUNTER — Encounter: Payer: Self-pay | Admitting: Internal Medicine

## 2015-05-07 ENCOUNTER — Other Ambulatory Visit (INDEPENDENT_AMBULATORY_CARE_PROVIDER_SITE_OTHER): Payer: Medicare Other

## 2015-05-07 VITALS — BP 112/78 | HR 60 | Temp 97.6°F | Resp 20 | Ht 61.0 in | Wt 197.0 lb

## 2015-05-07 DIAGNOSIS — I10 Essential (primary) hypertension: Secondary | ICD-10-CM | POA: Diagnosis not present

## 2015-05-07 DIAGNOSIS — R600 Localized edema: Secondary | ICD-10-CM | POA: Insufficient documentation

## 2015-05-07 DIAGNOSIS — E785 Hyperlipidemia, unspecified: Secondary | ICD-10-CM

## 2015-05-07 DIAGNOSIS — D649 Anemia, unspecified: Secondary | ICD-10-CM

## 2015-05-07 DIAGNOSIS — R7303 Prediabetes: Secondary | ICD-10-CM | POA: Diagnosis not present

## 2015-05-07 DIAGNOSIS — R413 Other amnesia: Secondary | ICD-10-CM

## 2015-05-07 HISTORY — DX: Prediabetes: R73.03

## 2015-05-07 LAB — CBC WITH DIFFERENTIAL/PLATELET
BASOS ABS: 0.1 10*3/uL (ref 0.0–0.1)
BASOS PCT: 1.1 % (ref 0.0–3.0)
EOS ABS: 0.3 10*3/uL (ref 0.0–0.7)
Eosinophils Relative: 4.4 % (ref 0.0–5.0)
HCT: 40 % (ref 36.0–46.0)
HEMOGLOBIN: 12.8 g/dL (ref 12.0–15.0)
LYMPHS PCT: 27.8 % (ref 12.0–46.0)
Lymphs Abs: 2.2 10*3/uL (ref 0.7–4.0)
MCHC: 32 g/dL (ref 30.0–36.0)
MCV: 72.7 fl — AB (ref 78.0–100.0)
MONOS PCT: 7.3 % (ref 3.0–12.0)
Monocytes Absolute: 0.6 10*3/uL (ref 0.1–1.0)
NEUTROS ABS: 4.7 10*3/uL (ref 1.4–7.7)
NEUTROS PCT: 59.4 % (ref 43.0–77.0)
Platelets: 219 10*3/uL (ref 150.0–400.0)
RBC: 5.5 Mil/uL — ABNORMAL HIGH (ref 3.87–5.11)
RDW: 15.7 % — ABNORMAL HIGH (ref 11.5–15.5)
WBC: 7.8 10*3/uL (ref 4.0–10.5)

## 2015-05-07 LAB — LIPID PANEL
Cholesterol: 218 mg/dL — ABNORMAL HIGH (ref 0–200)
HDL: 59.7 mg/dL (ref >39.00)
LDL Cholesterol: 145 mg/dL — ABNORMAL HIGH (ref 0–99)
NONHDL: 158.55
Total CHOL/HDL Ratio: 4
Triglycerides: 67 mg/dL (ref 0.0–149.0)
VLDL: 13.4 mg/dL (ref 0.0–40.0)

## 2015-05-07 LAB — COMPREHENSIVE METABOLIC PANEL
ALK PHOS: 91 U/L (ref 39–117)
ALT: 20 U/L (ref 0–35)
AST: 26 U/L (ref 0–37)
Albumin: 4 g/dL (ref 3.5–5.2)
BUN: 18 mg/dL (ref 6–23)
CHLORIDE: 103 meq/L (ref 96–112)
CO2: 26 mEq/L (ref 19–32)
Calcium: 10.3 mg/dL (ref 8.4–10.5)
Creatinine, Ser: 0.97 mg/dL (ref 0.40–1.20)
GFR: 73.88 mL/min (ref >60.00)
GLUCOSE: 96 mg/dL (ref 70–99)
POTASSIUM: 3.4 meq/L — AB (ref 3.5–5.1)
SODIUM: 139 meq/L (ref 135–145)
TOTAL PROTEIN: 7.8 g/dL (ref 6.0–8.3)
Total Bilirubin: 0.6 mg/dL (ref 0.2–1.2)

## 2015-05-07 LAB — TSH: TSH: 1.5 u[IU]/mL (ref 0.35–4.50)

## 2015-05-07 LAB — IRON: IRON: 60 ug/dL (ref 42–145)

## 2015-05-07 LAB — FERRITIN: FERRITIN: 118.1 ng/mL (ref 10.0–291.0)

## 2015-05-07 LAB — HEMOGLOBIN A1C: HEMOGLOBIN A1C: 6.1 % (ref 4.6–6.5)

## 2015-05-07 LAB — VITAMIN B12: VITAMIN B 12: 578 pg/mL (ref 211–911)

## 2015-05-07 MED ORDER — AMLODIPINE BESYLATE 10 MG PO TABS
10.0000 mg | ORAL_TABLET | Freq: Every day | ORAL | Status: DC
Start: 2015-05-07 — End: 2016-04-22

## 2015-05-07 MED ORDER — ATENOLOL 25 MG PO TABS: 25.0000 mg | ORAL_TABLET | Freq: Every day | ORAL | Status: DC

## 2015-05-07 MED ORDER — TRIAMTERENE-HCTZ 37.5-25 MG PO TABS: 1.0000 | ORAL_TABLET | Freq: Every day | ORAL | Status: DC

## 2015-05-07 MED ORDER — POTASSIUM CHLORIDE ER 10 MEQ PO TBCR: 10.0000 meq | EXTENDED_RELEASE_TABLET | Freq: Two times a day (BID) | ORAL | Status: DC

## 2015-05-07 NOTE — Assessment & Plan Note (Signed)
Per patient iron deficiency. Also has a family history Taking iron supplementation Will check CBC, iron levels  colonoscopy up-to-date

## 2015-05-07 NOTE — Progress Notes (Signed)
Subjective:    Patient ID: Brooke [name], female    DOB: 1950/10/31, 64 y.o.   MRN: [id number]  HPI She is here to establish with a new pcp.   Hypertension: She is taking her medication daily. She is fairly compliant with a low sodium diet.  She denies chest pain, palpitations, edema, shortness of breath and regular headaches. She is not exercising regularly.  She does monitor her blood pressure at home and it is well controlled.    Memory concerns:  She feels she has had memory concerns for a while.  Her previous pcp thought it was related to trying to do too much.  She has been taking care of her husband, mother-in-law and helping her sister out because she has been sick.  Some examples include:  She left her money in the atm machine recently.  She has forgotten things her husband has told her.  She forgets things she needs to do.  She denies difficulty keeping up with the bills.  She does need to make lists so she does not forget anything.  She denies depression and anxiety.  Legs swelling:  At times her legs swell.  The swelling is intermittent and bilaterally.  She is pretty good about limiting her sodium intake.  She is not exercising.  She is unsure if there is any pattern to the swelling.  She denies shortness of breath and does not have any heart issues.      Medications and allergies reviewed with patient and no updates were needed.  Patient Active Problem List   Diagnosis Date Noted  . Anemia, unspecified 10/12/2013  . FH: CAD (coronary artery disease) 04/27/2012  . OSTEOARTHRITIS 03/30/2008  . HYPERLIPIDEMIA 07/22/2007  . OBESITY NOS 02/22/2007  . ALLERGIC RHINITIS, CHRONIC 02/22/2007  . HYPERTENSION 02/18/2007    Current Outpatient Prescriptions on File Prior to Visit  Medication Sig Dispense Refill  . amLODipine (NORVASC) 10 MG tablet Take 1 tablet (10 mg total) by mouth daily. 100 tablet 0  . aspirin 81 MG chewable tablet Chew 81 mg by mouth daily.    Marland Kitchen atenolol  (TENORMIN) 25 MG tablet Take 1 tablet (25 mg total) by mouth daily. 100 tablet 0  . ibuprofen (ADVIL,MOTRIN) 200 MG tablet Take 400 mg by mouth every 6 (six) hours as needed. For pain    . potassium chloride (K-DUR) 10 MEQ tablet Take 1 tablet (10 mEq total) by mouth 2 (two) times daily. 200 tablet 0  . triamterene-hydrochlorothiazide (MAXZIDE-25) 37.5-25 MG tablet Take 1 tablet by mouth daily. 100 tablet 0   No current facility-administered medications on file prior to visit.    Past Medical History  Diagnosis Date  . Hypertension   . High cholesterol   . FH: CAD (coronary artery disease)   . Obesity     Past Surgical History  Procedure Laterality Date  . Hip surgery    . Carpal tunnel release      bilateral    Social History   Social History  . Marital Status: Married    Spouse Name: N/A  . Number of Children: 2  . Years of Education: 12th    Occupational History  . 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 Smoking status: Never Smoker   . Smokeless tobacco: None  . Alcohol Use: No  . Drug Use: No  . Sexual Activity: Not Asked   Other  Topics Concern  . 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            Review of Systems  Constitutional: Negative for fever, chills, appetite change, fatigue and unexpected weight change.  HENT: Positive for hearing loss (mild).   Eyes: Negative for visual disturbance.  Respiratory: Negative for cough, shortness of breath and wheezing.   Cardiovascular: Positive for leg swelling. Negative for chest pain and palpitations.  Gastrointestinal: Negative for nausea, abdominal pain, diarrhea, constipation and blood in stool.       No gerd  Endocrine: Negative for polydipsia and polyuria.  Genitourinary: Negative for dysuria and hematuria.       Increased night time urination  Musculoskeletal: Positive for arthralgias (mild  arthirtis in hands). Negative for back pain.  Neurological: Negative for dizziness, weakness, light-headedness, numbness and headaches.       No balance concerns  Psychiatric/Behavioral: Negative for dysphoric mood. The patient is not nervous/anxious.        Objective:   Filed Vitals:   05/07/15 0815  BP: 112/78  Pulse: 60  Temp: 97.6 F (36.4 C)  Resp: 20   Filed Weights   05/07/15 0815  Weight: 197 lb (89.359 kg)   Body mass index is 37.24 kg/(m^2).   Physical Exam  Constitutional: She is oriented to person, place, and time. She appears well-developed and well-nourished.  HENT:  Head: Normocephalic and atraumatic.  Eyes: Conjunctivae are normal.  Neck: Neck supple. No tracheal deviation present. No thyromegaly present.  No carotid bruit  Cardiovascular: Normal rate, regular rhythm and normal heart sounds.   No murmur heard. Pulmonary/Chest: Effort normal and breath sounds normal. No respiratory distress. She has no wheezes. She has no rales.  Abdominal: Soft. She exhibits no distension. There is no tenderness.  Musculoskeletal: She exhibits no edema.  Lymphadenopathy:    She has no cervical adenopathy.  Neurological: She is alert and oriented to person, place, and time.  Psychiatric: She has a normal mood and affect. Her behavior is normal.        Assessment & Plan:   See Problem List.  Follow-up in 6 months

## 2015-05-07 NOTE — Assessment & Plan Note (Signed)
No edema today on exam Advised her to watch her sodium intake, increase her exercise and work on weight loss Advised her to monitor for a pattern for the swelling Check basic blood work

## 2015-05-07 NOTE — Assessment & Plan Note (Signed)
Last A1c over a year ago was 6.2% Encouraged regular exercise and weight loss Recheck A1c today Follow up in 6 months-need to monitor more closely

## 2015-05-07 NOTE — Progress Notes (Signed)
Pre visit review using our clinic review tool, if applicable. No additional management support is needed unless otherwise documented below in the visit note. 

## 2015-05-07 NOTE — Patient Instructions (Addendum)
  We have reviewed your prior records including labs and tests today.  Test(s) ordered today. Your results will be released to [place] (or called to you) after review, usually within 72hours after test completion. If any changes need to be made, you will be notified at that same time.  All other Health Maintenance issues reviewed.   All recommended immunizations and age-appropriate screenings are up-to-date.  No immunizations administered today.   Medications reviewed and updated.  No changes recommended at this time.  Your prescription(s) have been submitted to your pharmacy. Please take as directed and contact our office if you believe you are having problem(s) with the medication(s).   Please schedule followup in 6 months

## 2015-05-07 NOTE — Assessment & Plan Note (Signed)
Not currently on medication Will check lipid panel in calculate ASCVD risk and assess whether or not she should be on medication Increase exercise

## 2015-05-07 NOTE — Assessment & Plan Note (Signed)
Blood pressure well controlled here and controlled at home Continue current doses of medications Encouraged regular exercise and weight loss No recent blood work so we will go ahead and check CMP Follow-up in 6 months

## 2015-05-07 NOTE — Assessment & Plan Note (Signed)
Minor memory difficulties-possibly within normal limits Will check TSH, vitamin B12 Discussed possible neurology referral-she deferred for now and we'll just monitor her symptoms No imaging necessary given lack of other symptoms and what seems to be mild memory difficulties She will return if her symptoms seemed to progress, if not we will follow up in 6 months

## 2015-10-25 ENCOUNTER — Encounter: Payer: Self-pay | Admitting: Gastroenterology

## 2015-11-21 ENCOUNTER — Ambulatory Visit (INDEPENDENT_AMBULATORY_CARE_PROVIDER_SITE_OTHER): Payer: Medicare Other | Admitting: Internal Medicine

## 2015-11-21 ENCOUNTER — Encounter: Payer: Self-pay | Admitting: Internal Medicine

## 2015-11-21 ENCOUNTER — Telehealth: Payer: Self-pay | Admitting: Internal Medicine

## 2015-11-21 VITALS — BP 130/62 | HR 74 | Temp 99.0°F | Resp 16 | Wt 173.0 lb

## 2015-11-21 DIAGNOSIS — J209 Acute bronchitis, unspecified: Secondary | ICD-10-CM

## 2015-11-21 MED ORDER — SULFAMETHOXAZOLE-TRIMETHOPRIM 800-160 MG PO TABS: 1.0000 | ORAL_TABLET | Freq: Two times a day (BID) | ORAL | Status: DC

## 2015-11-21 NOTE — Patient Instructions (Addendum)
  Medications reviewed and updated.  We will start an antibiotic called bactrim.  You can continue the over the counter cold medications.   Your prescription(s) have been submitted to your pharmacy. Please take as directed and contact our office if you believe you are having problem(s) with the medication(s).    Please followup or call if your symptoms do not improve or worsen.

## 2015-11-21 NOTE — Telephone Encounter (Signed)
Patient Name: Brooke [name]  DOB: 1950/07/07    Initial Comment Caller thinks she has pna, a lot of coughing and congestion, needing appt for this morning   Nurse Assessment  Nurse: Mallie Mussel, RN, Alveta Heimlich Date/Time Eilene Ghazi Time): 11/21/2015 7:29:03 AM  Confirm and document reason for call. If symptomatic, describe symptoms. You must click the next button to save text entered. ---Caller states that she has a cough and congestion for about 2 weeks now. She has a slight fever. She thinks she has pneumonia and wants to be seen this morning. Her husband was seen yesterday and diagnosed with pneumonia and she has the same symptoms.  Has the patient traveled out of the country within the last 30 days? ---No  Does the patient have any new or worsening symptoms? ---Yes  Will a triage be completed? ---Yes  Related visit to physician within the last 2 weeks? ---No  Does the PT have any chronic conditions? (i.e. diabetes, asthma, etc.) ---Yes  List chronic conditions. ---Pre-Diabetic  Is this a behavioral health or substance abuse call? ---No     Guidelines    Guideline Title Affirmed Question Affirmed Notes  Cough - Acute Productive Coughing up rusty-colored (reddish-brown) sputum    Final Disposition User   See Physician within [place], RN, Alveta Heimlich    Comments  Appointment scheduled for this morning at 8:30am with Dr. Billey Gosling.   Referrals  REFERRED TO PCP OFFICE   Disagree/Comply: Comply

## 2015-11-21 NOTE — Progress Notes (Signed)
Pre visit review using our clinic review tool, if applicable. No additional management support is needed unless otherwise documented below in the visit note. 

## 2015-11-21 NOTE — Progress Notes (Signed)
Subjective:    Patient ID: Brooke [name], female    DOB: 05/15/1951, 65 y.o.   MRN: [id number]  HPI She is here for an acute visit for cold symptoms.  Her husband has pneumonia and she has similar symptoms.    Her symptoms started a couple of weeks ago.  She had a cough, rhinorrhea and felt bad.  Her symptoms went away again and this week her symptoms came back.  She is coughing, has nasal congestion, rhinorrhea, sinus pressure and headaches.  Her cough is productive. She denies wheeze or sob.     She is taking cough drops, alka seltzer plus.   Medications and allergies reviewed with patient and updated if appropriate.  Patient Active Problem List   Diagnosis Date Noted  . Prediabetes 05/07/2015  . Memory difficulties 05/07/2015  . Bilateral leg edema 05/07/2015  . Anemia, unspecified 10/12/2013  . FH: CAD (coronary artery disease) 04/27/2012  . OSTEOARTHRITIS 03/30/2008  . Hyperlipidemia 07/22/2007  . OBESITY NOS 02/22/2007  . ALLERGIC RHINITIS, CHRONIC 02/22/2007  . Essential hypertension 02/18/2007    Current Outpatient Prescriptions on File Prior to Visit  Medication Sig Dispense Refill  . amLODipine (NORVASC) 10 MG tablet Take 1 tablet (10 mg total) by mouth daily. 90 tablet 3  . aspirin 81 MG chewable tablet Chew 81 mg by mouth daily.    Marland Kitchen atenolol (TENORMIN) 25 MG tablet Take 1 tablet (25 mg total) by mouth daily. 90 tablet 3  . ferrous sulfate 325 (65 FE) MG tablet Take 324 mg by mouth daily with breakfast.    . ibuprofen (ADVIL,MOTRIN) 200 MG tablet Take 400 mg by mouth every 6 (six) hours as needed. For pain    . olive oil external oil Apply 1 mg topically as needed.    . potassium chloride (K-DUR) 10 MEQ tablet Take 1 tablet (10 mEq total) by mouth 2 (two) times daily. 180 tablet 3  . triamterene-hydrochlorothiazide (MAXZIDE-25) 37.5-25 MG tablet Take 1 tablet by mouth daily. 90 tablet 3   No current facility-administered medications on file prior to visit.     Past Medical History  Diagnosis Date  . Hypertension   . High cholesterol   . FH: CAD (coronary artery disease)   . Obesity     Past Surgical History  Procedure Laterality Date  . Hip surgery    . Carpal tunnel release      bilateral    Social History   Social History  . Marital Status: Married    Spouse Name: N/A  . Number of Children: 2  . Years of Education: 12th    Occupational History  . 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 Smoking status: Never Smoker   . Smokeless tobacco: Not on file  . Alcohol Use: No  . Drug Use: No  . Sexual Activity: Not on file   Other Topics Concern  . 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            Family History  Problem Relation Age of Onset  . CAD Sister   . CAD Father   . Kidney disease Sister   . Lung cancer Mother   . Brain cancer Mother     Review of Systems  Constitutional: Positive for fever.  HENT: Positive for rhinorrhea, sinus pressure  and sore throat (intermittent). Negative for congestion and ear pain.   Respiratory: Positive for cough (productive of brown mucus). Negative for chest tightness, shortness of breath and wheezing.   Cardiovascular: Negative for chest pain.  Gastrointestinal: Negative for nausea and abdominal pain.  Neurological: Positive for headaches. Negative for dizziness and light-headedness.       Objective:   Filed Vitals:   11/21/15 0841  BP: 130/62  Pulse: 74  Temp: 99 F (37.2 C)  Resp: 16   Filed Weights   11/21/15 0841  Weight: 173 lb (78.472 kg)   Body mass index is 32.7 kg/(m^2).   Physical Exam GENERAL APPEARANCE: Appears stated age, well appearing, NAD EYES: conjunctiva clear, no icterus HEENT: bilateral tympanic membranes and ear canals normal, oropharynx with no erythema, b/l maxillary sinus tenderness, no thyromegaly, trachea midline,  no cervical or supraclavicular lymphadenopathy LUNGS: Clear to auscultation without wheeze or crackles, unlabored breathing, good air entry bilaterally HEART: Normal S1,S2 without murmurs EXTREMITIES: Without clubbing, cyanosis, or edema      Assessment & Plan:   Acute bronchitis: Mild - mod in nature Likely bacterial Will prescribe bactrim for one week otc medications for symptom relief Rest, fluids Call if no improvement or any worsening

## 2016-03-17 ENCOUNTER — Telehealth: Payer: Self-pay | Admitting: Internal Medicine

## 2016-03-17 NOTE — Telephone Encounter (Signed)
Pt called in and would like orders for lab work to be put in before cpe so she can go over results

## 2016-03-18 ENCOUNTER — Telehealth: Payer: Self-pay | Admitting: Emergency Medicine

## 2016-03-18 NOTE — Telephone Encounter (Signed)
Spoke with pt to inform.  

## 2016-03-18 NOTE — Telephone Encounter (Signed)
Things that may help:   Relaxation-paced respiration Exercise and weight loss Acupuncture  OTC supplements:  Black cohosh Evening primrose oil Soy products Phyto-estrogens contains components that occur naturally in plants, fruits and vegetables

## 2016-03-18 NOTE — Telephone Encounter (Signed)
Pt is having hot flashes. She is asking if there is something she can take over the counter for them. She doesn't know what to do since she is on so much medication. Please advise thanks.

## 2016-03-18 NOTE — Telephone Encounter (Signed)
Please advise 

## 2016-04-15 ENCOUNTER — Ambulatory Visit (INDEPENDENT_AMBULATORY_CARE_PROVIDER_SITE_OTHER): Payer: Medicare Other | Admitting: Internal Medicine

## 2016-04-15 ENCOUNTER — Encounter: Payer: Self-pay | Admitting: Internal Medicine

## 2016-04-15 VITALS — BP 120/64 | HR 68 | Temp 97.7°F | Resp 16 | Ht 61.0 in | Wt 179.0 lb

## 2016-04-15 DIAGNOSIS — D649 Anemia, unspecified: Secondary | ICD-10-CM

## 2016-04-15 DIAGNOSIS — Z23 Encounter for immunization: Secondary | ICD-10-CM | POA: Diagnosis not present

## 2016-04-15 DIAGNOSIS — G479 Sleep disorder, unspecified: Secondary | ICD-10-CM | POA: Insufficient documentation

## 2016-04-15 DIAGNOSIS — Z1159 Encounter for screening for other viral diseases: Secondary | ICD-10-CM | POA: Diagnosis not present

## 2016-04-15 DIAGNOSIS — Z0000 Encounter for general adult medical examination without abnormal findings: Secondary | ICD-10-CM

## 2016-04-15 DIAGNOSIS — E785 Hyperlipidemia, unspecified: Secondary | ICD-10-CM

## 2016-04-15 DIAGNOSIS — R7303 Prediabetes: Secondary | ICD-10-CM

## 2016-04-15 DIAGNOSIS — R600 Localized edema: Secondary | ICD-10-CM

## 2016-04-15 DIAGNOSIS — G4700 Insomnia, unspecified: Secondary | ICD-10-CM | POA: Insufficient documentation

## 2016-04-15 DIAGNOSIS — I10 Essential (primary) hypertension: Secondary | ICD-10-CM

## 2016-04-15 HISTORY — DX: Insomnia, unspecified: G47.00

## 2016-04-15 MED ORDER — TRAZODONE HCL 50 MG PO TABS: 25.0000 mg | ORAL_TABLET | Freq: Every day | ORAL | 5 refills | Status: DC

## 2016-04-15 NOTE — Assessment & Plan Note (Signed)
BP well controlled Current regimen effective and well tolerated Continue current medications at current doses cmp  

## 2016-04-15 NOTE — Assessment & Plan Note (Signed)
Check lipid panel  

## 2016-04-15 NOTE — Progress Notes (Signed)
Subjective:    Patient ID: Brooke [name], female    DOB: 07-25-1950, 65 y.o.   MRN: [id number]  HPI She is here for a physical exam.   She is not getting enough sleep.  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  She can not fall asleep.  She may dose off and then wakes up.  Then she can sleep but needs to get up early.  She lays down at 10, alarm goes off at 4.  She has to be at work at 5:30.   Her mind races at night.  Her husband gets up at night. She is exhausted.  She tries to take a warm bath at night and it does not help.    She is still doing the prediabetes class.  She has lost weight, but wants to lose more.  She is eating a healthy diet.  Medications and allergies reviewed with patient and updated if appropriate.  Patient Active Problem List   Diagnosis Date Noted  . Prediabetes 05/07/2015  . Memory difficulties 05/07/2015  . Bilateral leg edema 05/07/2015  . Anemia, unspecified 10/12/2013  . FH: CAD (coronary artery disease) 04/27/2012  . OSTEOARTHRITIS 03/30/2008  . Hyperlipidemia 07/22/2007  . OBESITY NOS 02/22/2007  . ALLERGIC RHINITIS, CHRONIC 02/22/2007  . Essential hypertension 02/18/2007    Current Outpatient Prescriptions on File Prior to Visit  Medication Sig Dispense Refill  . amLODipine (NORVASC) 10 MG tablet Take 1 tablet (10 mg total) by mouth daily. 90 tablet 3  . aspirin 81 MG chewable tablet Chew 81 mg by mouth daily.    Marland Kitchen atenolol (TENORMIN) 25 MG tablet Take 1 tablet (25 mg total) by mouth daily. 90 tablet 3  . ferrous sulfate 325 (65 FE) MG tablet Take 324 mg by mouth daily with breakfast.    . ibuprofen (ADVIL,MOTRIN) 200 MG tablet Take 400 mg by mouth every 6 (six) hours as needed. For pain    . olive oil external oil Apply 1 mg topically as needed.    . potassium chloride (K-DUR) 10 MEQ tablet Take 1 tablet (10 mEq total) by mouth 2 (two) times daily. 180 tablet 3  . triamterene-hydrochlorothiazide (MAXZIDE-25) 37.5-25 MG tablet  Take 1 tablet by mouth daily. 90 tablet 3   No current facility-administered medications on file prior to visit.     Past Medical History:  Diagnosis Date  . FH: CAD (coronary artery disease)   . High cholesterol   . Hypertension   . Obesity     Past Surgical History:  Procedure Laterality Date  . CARPAL TUNNEL RELEASE     bilateral  . HIP SURGERY      Social History   Social History  . Marital status: Married    Spouse name: N/A  . Number of children: 2  . Years of education: 12th    Occupational History  . 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 Smoking status: Never Smoker  . Smokeless tobacco: Not on file  . Alcohol use No  . Drug use: No  . Sexual activity: Not on file   Other Topics Concern  . 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            Family History  Problem Relation Age of Onset  . CAD Sister   .  CAD Father   . Kidney disease Sister   . Lung cancer Mother   . Brain cancer Mother     Review of Systems  Constitutional: Negative for appetite change, chills and fever.       Hot flashes  HENT: Negative for hearing loss.   Eyes: Negative for visual disturbance.  Respiratory: Negative for cough, shortness of breath and wheezing.   Cardiovascular: Positive for leg swelling. Negative for chest pain and palpitations.  Gastrointestinal: Negative for abdominal pain, blood in stool, constipation, diarrhea and nausea.       No gerd  Endocrine: Positive for polyuria.  Genitourinary: Negative for dysuria and hematuria.  Musculoskeletal: Negative for arthralgias and back pain.  Skin: Negative for color change.  Neurological: Negative for light-headedness and headaches.  Psychiatric/Behavioral: Positive for sleep disturbance. Negative for dysphoric mood. The patient is not nervous/anxious.        Objective:   Vitals:   04/15/16  1544  BP: 120/64  Pulse: 68  Resp: 16  Temp: 97.7 F (36.5 C)   Filed Weights   04/15/16 1544  Weight: 179 lb (81.2 kg)   Body mass index is 33.82 kg/m.   Physical Exam Constitutional: She appears well-developed and well-nourished. No distress.  HENT:  Head: Normocephalic and atraumatic.  Right Ear: External ear normal. Normal ear canal and TM Left Ear: External ear normal.  Normal ear canal and TM Mouth/Throat: Oropharynx is clear and moist.  Eyes: Conjunctivae and EOM are normal.  Neck: Neck supple. No tracheal deviation present. No thyromegaly present.  No carotid bruit  Cardiovascular: Normal rate, regular rhythm and normal heart sounds.   No murmur heard.  No edema. Pulmonary/Chest: Effort normal and breath sounds normal. No respiratory distress. She has no wheezes. She has no rales.  Breast: deferred  Abdominal: Soft. She exhibits no distension. There is no tenderness.  Lymphadenopathy: She has no cervical adenopathy.  Skin: Skin is warm and dry. She is not diaphoretic.  Psychiatric: She has a normal mood and affect. Her behavior is normal.         Assessment & Plan:   Physical exam: Screening blood work ordered Immunizations  Up to date  Colonoscopy  Up to date  Mammogram  Up to date  Gyn - no longer goes Dexa - deferred today Eye exams  Up to date  Exercise - no able to exercise due to limited time during the day, fatigue Weight - working on weight loss Skin - no concerns Substance abuse - none  See Problem List for Assessment and Plan of chronic medical problems.  F/u annually

## 2016-04-15 NOTE — Assessment & Plan Note (Addendum)
Cbc, iron, b12

## 2016-04-15 NOTE — Assessment & Plan Note (Addendum)
Probably related to increased stress Getting inadequate sleep Trial of trazodone - start at 25 mg and titrate up as needed Discussed possible side effects

## 2016-04-15 NOTE — Patient Instructions (Addendum)
Try trazodone at 25 mg at night.  If that does not work try 50 mg (1 pill) at night.  If that is not effective try taking 2 pills (100 mg ) at night.  Call with questions or concerns.    Test(s) ordered today. Your results will be released to MyChart (or called to you) after review, usually within 72hours after test completion. If any changes need to be made, you will be notified at that same time.  All other Health Maintenance issues reviewed.   All recommended immunizations and age-appropriate screenings are up-to-date or discussed.  Flu immunization administered today.   Medications reviewed and updated.  Changes include trying trazodone for sleep.   Your prescription(s) have been submitted to your pharmacy. Please take as directed and contact our office if you believe you are having problem(s) with the medication(s).   Please followup in 6-12 months.   Health Maintenance, Female Adopting a healthy lifestyle and getting preventive care can go a long way to promote health and wellness. Talk with your health care provider about what schedule of regular examinations is right for you. This is a good chance for you to check in with your provider about disease prevention and staying healthy. In between checkups, there are plenty of things you can do on your own. Experts have done a lot of research about which lifestyle changes and preventive measures are most likely to keep you healthy. Ask your health care provider for more information. WEIGHT AND DIET  Eat a healthy diet  Be sure to include plenty of vegetables, fruits, low-fat dairy products, and lean protein.  Do not eat a lot of foods high in solid fats, added sugars, or salt.  Get regular exercise. This is one of the most important things you can do for your health.  Most adults should exercise for at least 150 minutes each week. The exercise should increase your heart rate and make you sweat (moderate-intensity exercise).  Most  adults should also do strengthening exercises at least twice a week. This is in addition to the moderate-intensity exercise.  Maintain a healthy weight  Body mass index (BMI) is a measurement that can be used to identify possible weight problems. It estimates body fat based on height and weight. Your health care provider can help determine your BMI and help you achieve or maintain a healthy weight.  For females 63 years of age and older:   A BMI below 18.5 is considered underweight.  A BMI of 18.5 to 24.9 is normal.  A BMI of 25 to 29.9 is considered overweight.  A BMI of 30 and above is considered obese.  Watch levels of cholesterol and blood lipids  You should start having your blood tested for lipids and cholesterol at 67 years of age, then have this test every 5 years.  You may need to have your cholesterol levels checked more often if:  Your lipid or cholesterol levels are high.  You are older than 67 years of age.  You are at high risk for heart disease.  CANCER SCREENING   Lung Cancer  Lung cancer screening is recommended for adults 22-40 years old who are at high risk for lung cancer because of a history of smoking.  A yearly low-dose CT scan of the lungs is recommended for people who:  Currently smoke.  Have quit within the past 15 years.  Have at least a 30-pack-year history of smoking. A pack year is smoking an average of one  pack of cigarettes a day for 1 year.  Yearly screening should continue until it has been 15 years since you quit.  Yearly screening should stop if you develop a health problem that would prevent you from having lung cancer treatment.  Breast Cancer  Practice breast self-awareness. This means understanding how your breasts normally appear and feel.  It also means doing regular breast self-exams. Let your health care provider know about any changes, no matter how small.  If you are in your 20s or 30s, you should have a clinical  breast exam (CBE) by a health care provider every 1-3 years as part of a regular health exam.  If you are 55 or older, have a CBE every year. Also consider having a breast X-ray (mammogram) every year.  If you have a family history of breast cancer, talk to your health care provider about genetic screening.  If you are at high risk for breast cancer, talk to your health care provider about having an MRI and a mammogram every year.  Breast cancer gene (BRCA) assessment is recommended for women who have family members with BRCA-related cancers. BRCA-related cancers include:  Breast.  Ovarian.  Tubal.  Peritoneal cancers.  Results of the assessment will determine the need for genetic counseling and BRCA1 and BRCA2 testing. Cervical Cancer Your health care provider may recommend that you be screened regularly for cancer of the pelvic organs (ovaries, uterus, and vagina). This screening involves a pelvic examination, including checking for microscopic changes to the surface of your cervix (Pap test). You may be encouraged to have this screening done every 3 years, beginning at age 37.  For women ages 63-65, health care providers may recommend pelvic exams and Pap testing every 3 years, or they may recommend the Pap and pelvic exam, combined with testing for human papilloma virus (HPV), every 5 years. Some types of HPV increase your risk of cervical cancer. Testing for HPV may also be done on women of any age with unclear Pap test results.  Other health care providers may not recommend any screening for nonpregnant women who are considered low risk for pelvic cancer and who do not have symptoms. Ask your health care provider if a screening pelvic exam is right for you.  If you have had past treatment for cervical cancer or a condition that could lead to cancer, you need Pap tests and screening for cancer for at least 20 years after your treatment. If Pap tests have been discontinued, your risk  factors (such as having a new sexual partner) need to be reassessed to determine if screening should resume. Some women have medical problems that increase the chance of getting cervical cancer. In these cases, your health care provider may recommend more frequent screening and Pap tests. Colorectal Cancer  This type of cancer can be detected and often prevented.  Routine colorectal cancer screening usually begins at 67 years of age and continues through 67 years of age.  Your health care provider may recommend screening at an earlier age if you have risk factors for colon cancer.  Your health care provider may also recommend using home test kits to check for hidden blood in the stool.  A small camera at the end of a tube can be used to examine your colon directly (sigmoidoscopy or colonoscopy). This is done to check for the earliest forms of colorectal cancer.  Routine screening usually begins at age 70.  Direct examination of the colon should be repeated every  5-10 years through 67 years of age. However, you may need to be screened more often if early forms of precancerous polyps or small growths are found. Skin Cancer  Check your skin from head to toe regularly.  Tell your health care provider about any new moles or changes in moles, especially if there is a change in a mole's shape or color.  Also tell your health care provider if you have a mole that is larger than the size of a pencil eraser.  Always use sunscreen. Apply sunscreen liberally and repeatedly throughout the day.  Protect yourself by wearing long sleeves, pants, a wide-brimmed hat, and sunglasses whenever you are outside. HEART DISEASE, DIABETES, AND HIGH BLOOD PRESSURE   High blood pressure causes heart disease and increases the risk of stroke. High blood pressure is more likely to develop in:  People who have blood pressure in the high end of the normal range (130-139/85-89 mm Hg).  People who are overweight or  obese.  People who are African American.  If you are 14-66 years of age, have your blood pressure checked every 3-5 years. If you are 98 years of age or older, have your blood pressure checked every year. You should have your blood pressure measured twice--once when you are at a hospital or clinic, and once when you are not at a hospital or clinic. Record the average of the two measurements. To check your blood pressure when you are not at a hospital or clinic, you can use:  An automated blood pressure machine at a pharmacy.  A home blood pressure monitor.  If you are between 62 years and 57 years old, ask your health care provider if you should take aspirin to prevent strokes.  Have regular diabetes screenings. This involves taking a blood sample to check your fasting blood sugar level.  If you are at a normal weight and have a low risk for diabetes, have this test once every three years after 67 years of age.  If you are overweight and have a high risk for diabetes, consider being tested at a younger age or more often. PREVENTING INFECTION  Hepatitis B  If you have a higher risk for hepatitis B, you should be screened for this virus. You are considered at high risk for hepatitis B if:  You were born in a country where hepatitis B is common. Ask your health care provider which countries are considered high risk.  Your parents were born in a high-risk country, and you have not been immunized against hepatitis B (hepatitis B vaccine).  You have HIV or AIDS.  You use needles to inject street drugs.  You live with someone who has hepatitis B.  You have had sex with someone who has hepatitis B.  You get hemodialysis treatment.  You take certain medicines for conditions, including cancer, organ transplantation, and autoimmune conditions. Hepatitis C  Blood testing is recommended for:  Everyone born from [birth year] through 1967.  Anyone with known risk factors for hepatitis  C. Sexually transmitted infections (STIs)  You should be screened for sexually transmitted infections (STIs) including gonorrhea and chlamydia if:  You are sexually active and are younger than 66 years of age.  You are older than 67 years of age and your health care provider tells you that you are at risk for this type of infection.  Your sexual activity has changed since you were last screened and you are at an increased risk for chlamydia or gonorrhea. Ask  your health care provider if you are at risk.  If you do not have HIV, but are at risk, it may be recommended that you take a prescription medicine daily to prevent HIV infection. This is called pre-exposure prophylaxis (PrEP). You are considered at risk if:  You are sexually active and do not regularly use condoms or know the HIV status of your partner(s).  You take drugs by injection.  You are sexually active with a partner who has HIV. Talk with your health care provider about whether you are at high risk of being infected with HIV. If you choose to begin PrEP, you should first be tested for HIV. You should then be tested every 3 months for as long as you are taking PrEP.  PREGNANCY   If you are premenopausal and you may become pregnant, ask your health care provider about preconception counseling.  If you may become pregnant, take 400 to 800 micrograms (mcg) of folic acid every day.  If you want to prevent pregnancy, talk to your health care provider about birth control (contraception). OSTEOPOROSIS AND MENOPAUSE   Osteoporosis is a disease in which the bones lose minerals and strength with aging. This can result in serious bone fractures. Your risk for osteoporosis can be identified using a bone density scan.  If you are 96 years of age or older, or if you are at risk for osteoporosis and fractures, ask your health care provider if you should be screened.  Ask your health care provider whether you should take a calcium or  vitamin D supplement to lower your risk for osteoporosis.  Menopause may have certain physical symptoms and risks.  Hormone replacement therapy may reduce some of these symptoms and risks. Talk to your health care provider about whether hormone replacement therapy is right for you.  HOME CARE INSTRUCTIONS   Schedule regular health, dental, and eye exams.  Stay current with your immunizations.   Do not use any tobacco products including cigarettes, chewing tobacco, or electronic cigarettes.  If you are pregnant, do not drink alcohol.  If you are breastfeeding, limit how much and how often you drink alcohol.  Limit alcohol intake to no more than 1 drink per day for nonpregnant women. One drink equals 12 ounces of beer, 5 ounces of wine, or 1 ounces of hard liquor.  Do not use street drugs.  Do not share needles.  Ask your health care provider for help if you need support or information about quitting drugs.  Tell your health care provider if you often feel depressed.  Tell your health care provider if you have ever been abused or do not feel safe at home.   This information is not intended to replace advice given to you by your health care provider. Make sure you discuss any questions you have with your health care provider.   Document Released: 12/09/2010 Document Revised: 06/16/2014 Document Reviewed: 04/27/2013 Elsevier Interactive Patient Education [employer].

## 2016-04-15 NOTE — Assessment & Plan Note (Signed)
Check a1c Eating healthy, working on weight loss

## 2016-04-15 NOTE — Assessment & Plan Note (Signed)
Controlled Taking maxide daily cmp

## 2016-04-15 NOTE — Progress Notes (Signed)
Pre visit review using our clinic review tool, if applicable. No additional management support is needed unless otherwise documented below in the visit note. 

## 2016-04-22 ENCOUNTER — Other Ambulatory Visit: Payer: Self-pay | Admitting: Emergency Medicine

## 2016-04-22 MED ORDER — TRIAMTERENE-HCTZ 37.5-25 MG PO TABS: 1.0000 | ORAL_TABLET | Freq: Every day | ORAL | 3 refills | Status: DC

## 2016-04-22 MED ORDER — AMLODIPINE BESYLATE 10 MG PO TABS: 10.0000 mg | ORAL_TABLET | Freq: Every day | ORAL | 3 refills | Status: DC

## 2016-04-22 MED ORDER — ATENOLOL 25 MG PO TABS: 25.0000 mg | ORAL_TABLET | Freq: Every day | ORAL | 3 refills | Status: DC

## 2016-04-25 ENCOUNTER — Encounter: Payer: Self-pay | Admitting: Family

## 2016-04-25 ENCOUNTER — Ambulatory Visit (INDEPENDENT_AMBULATORY_CARE_PROVIDER_SITE_OTHER): Payer: Medicare Other | Admitting: Family

## 2016-04-25 DIAGNOSIS — J320 Chronic maxillary sinusitis: Secondary | ICD-10-CM | POA: Insufficient documentation

## 2016-04-25 DIAGNOSIS — J0100 Acute maxillary sinusitis, unspecified: Secondary | ICD-10-CM | POA: Diagnosis not present

## 2016-04-25 MED ORDER — HYDROCOD POLST-CPM POLST ER 10-8 MG/5ML PO SUER: 5.0000 mL | Freq: Every evening | ORAL | 0 refills | Status: DC | PRN

## 2016-04-25 MED ORDER — LEVOFLOXACIN 500 MG PO TABS: 500.0000 mg | ORAL_TABLET | Freq: Every day | ORAL | 0 refills | Status: DC

## 2016-04-25 NOTE — Assessment & Plan Note (Signed)
Symptoms and exam consistent with sinusitis. Recommend watchful waiting for the next 2-3 days as this may still be a viral infection. Continue over-the-counter medications as needed for symptom relief and supportive care. Start Tussionex as needed for cough and sleep. If symptoms continue to worsen start levofloxacin. Follow-up if symptoms worsen or do not improve.

## 2016-04-25 NOTE — Progress Notes (Signed)
Subjective:    Patient ID: Brooke [name], female    DOB: Oct 31, 1950, 65 y.o.   MRN: [id number]  Chief Complaint  Patient presents with  . Cough    scratchy throat, headaches, congestion, and cough x1 week    HPI:  Brooke [name] is a 65 y.o. female who  has a past medical history of FH: CAD (coronary artery disease); High cholesterol; Hypertension; and Obesity. and presents today for  This is a new problem. Associated symptoms of scratchy throat, headaches, congestion, and cough have been going on for approximately one week. Denies fevers. Modifying factors include Mucinex and cough drops which have not helped very much. Cough severity is enough to disturb her sleep. Overall course of symptoms appears to be worsening. No recent antibiotic use.     Allergies  Allergen Reactions  . Penicillins Hives      Outpatient Medications Prior to Visit  Medication Sig Dispense Refill  . amLODipine (NORVASC) 10 MG tablet Take 1 tablet (10 mg total) by mouth daily. 90 tablet 3  . aspirin 81 MG chewable tablet Chew 81 mg by mouth daily.    Marland Kitchen atenolol (TENORMIN) 25 MG tablet Take 1 tablet (25 mg total) by mouth daily. 90 tablet 3  . ferrous sulfate 325 (65 FE) MG tablet Take 324 mg by mouth daily with breakfast.    . ibuprofen (ADVIL,MOTRIN) 200 MG tablet Take 400 mg by mouth every 6 (six) hours as needed. For pain    . olive oil external oil Apply 1 mg topically as needed.    . potassium chloride (K-DUR) 10 MEQ tablet Take 1 tablet (10 mEq total) by mouth 2 (two) times daily. 180 tablet 3  . traZODone (DESYREL) 50 MG tablet Take 0.5-1 tablets (25-50 mg total) by mouth at bedtime. 30 tablet 5  . triamterene-hydrochlorothiazide (MAXZIDE-25) 37.5-25 MG tablet Take 1 tablet by mouth daily. 90 tablet 3   No facility-administered medications prior to visit.      Review of Systems  Constitutional: Negative for chills and fever.  HENT: Positive for congestion, rhinorrhea, sinus pressure and  sore throat.   Respiratory: Positive for cough. Negative for chest tightness and shortness of breath.       Objective:    BP 138/78 (BP Location: Left Arm, Patient Position: Sitting, Cuff Size: Normal)   Pulse 64   Temp 98.5 F (36.9 C) (Oral)   Resp 16   Ht 5\' 1"  (1.549 m)   Wt 178 lb (80.7 kg)   SpO2 96%   BMI 33.63 kg/m  Nursing note and vital signs reviewed.  Physical Exam  Constitutional: She is oriented to person, place, and time. She appears well-developed and well-nourished. No distress.  HENT:  Right Ear: Hearing, tympanic membrane, external ear and ear canal normal.  Left Ear: Hearing, tympanic membrane, external ear and ear canal normal.  Nose: Right sinus exhibits maxillary sinus tenderness. Left sinus exhibits maxillary sinus tenderness.  Mouth/Throat: Uvula is midline, oropharynx is clear and moist and mucous membranes are normal.  Cardiovascular: Normal rate, regular rhythm, normal heart sounds and intact distal pulses.   Pulmonary/Chest: Effort normal and breath sounds normal. No respiratory distress. She has no wheezes. She has no rales.  Neurological: She is alert and oriented to person, place, and time.  Skin: Skin is warm and dry.  Psychiatric: She has a normal mood and affect. Her behavior is normal. Judgment and thought content normal.       Assessment &  Plan:   Problem List Items Addressed This Visit      Respiratory   Maxillary sinusitis    Symptoms and exam consistent with sinusitis. Recommend watchful waiting for the next 2-3 days as this may still be a viral infection. Continue over-the-counter medications as needed for symptom relief and supportive care. Start Tussionex as needed for cough and sleep. If symptoms continue to worsen start levofloxacin. Follow-up if symptoms worsen or do not improve.      Relevant Medications   levofloxacin (LEVAQUIN) 500 MG tablet   chlorpheniramine-HYDROcodone (TUSSIONEX PENNKINETIC ER) 10-8 MG/5ML SUER        I am having Brooke [name] start on levofloxacin and chlorpheniramine-HYDROcodone. I am also having her maintain her aspirin, ibuprofen, olive oil, ferrous sulfate, potassium chloride, traZODone, atenolol, amLODipine, and triamterene-hydrochlorothiazide.   Meds ordered this encounter  Medications  . levofloxacin (LEVAQUIN) 500 MG tablet    Sig: Take 1 tablet (500 mg total) by mouth daily.    Dispense:  7 tablet    Refill:  0    Order Specific Question:   Supervising Provider    Answer:   Pricilla Holm A [id number]  . chlorpheniramine-HYDROcodone (TUSSIONEX PENNKINETIC ER) 10-8 MG/5ML SUER    Sig: Take 5 mLs by mouth at bedtime as needed.    Dispense:  115 mL    Refill:  0    Order Specific Question:   Supervising Provider    Answer:   Pricilla Holm A [id number]     Follow-up: Return if symptoms worsen or fail to improve.  Mauricio Po, FNP

## 2016-04-25 NOTE — Patient Instructions (Signed)
Thank you for choosing [facility].  SUMMARY AND INSTRUCTIONS:  Medication:  Please start the levofloxacin if your symptoms do not improve.  Use the Tussionex as needed for cough and sleep.   Your prescription(s) have been submitted to your pharmacy or been printed and provided for you. Please take as directed and contact our office if you believe you are having problem(s) with the medication(s) or have any questions.  Follow up:  If your symptoms worsen or fail to improve, please contact our office for further instruction, or in case of emergency go directly to the emergency room at the closest medical facility.   General Recommendations:    Please drink plenty of fluids.  Get plenty of rest   Sleep in humidified air  Use saline nasal sprays  Netti pot   OTC Medications:  Decongestants - helps relieve congestion   Flonase (generic fluticasone) or Nasacort (generic triamcinolone) - please make sure to use the "cross-over" technique at a 45 degree angle towards the opposite eye as opposed to straight up the nasal passageway.   Sudafed (generic pseudoephedrine - Note this is the one that is available behind the pharmacy counter); Products with phenylephrine (-PE) may also be used but is often not as effective as pseudoephedrine.   If you have HIGH BLOOD PRESSURE - Coricidin HBP; AVOID any product that is -D as this contains pseudoephedrine which may increase your blood pressure.  Afrin (oxymetazoline) every 6-8 hours for up to 3 days.   Allergies - helps relieve runny nose, itchy eyes and sneezing   Claritin (generic loratidine), Allegra (fexofenidine), or Zyrtec (generic cyrterizine) for runny nose. These medications should not cause drowsiness.  Note - Benadryl (generic diphenhydramine) may be used however may cause drowsiness  Cough -   Delsym or Robitussin (generic dextromethorphan)  Expectorants - helps loosen mucus to ease removal   Mucinex (generic  guaifenesin) as directed on the package.  Headaches / General Aches   Tylenol (generic acetaminophen) - DO NOT EXCEED 3 grams (3,000 mg) in a 24 hour time period  Advil/Motrin (generic ibuprofen)   Sore Throat -   Salt water gargle   Chloraseptic (generic benzocaine) spray or lozenges / Sucrets (generic dyclonine)

## 2016-04-30 ENCOUNTER — Encounter: Payer: Self-pay | Admitting: Internal Medicine

## 2016-04-30 ENCOUNTER — Ambulatory Visit (INDEPENDENT_AMBULATORY_CARE_PROVIDER_SITE_OTHER): Payer: Medicare Other | Admitting: Internal Medicine

## 2016-04-30 VITALS — BP 128/74 | HR 91 | Temp 98.3°F | Resp 20 | Wt 176.0 lb

## 2016-04-30 DIAGNOSIS — I10 Essential (primary) hypertension: Secondary | ICD-10-CM

## 2016-04-30 DIAGNOSIS — J019 Acute sinusitis, unspecified: Secondary | ICD-10-CM | POA: Diagnosis not present

## 2016-04-30 DIAGNOSIS — J309 Allergic rhinitis, unspecified: Secondary | ICD-10-CM

## 2016-04-30 MED ORDER — AZITHROMYCIN 250 MG PO TABS: ORAL_TABLET | ORAL | 1 refills | Status: DC

## 2016-04-30 NOTE — Assessment & Plan Note (Signed)
stable overall by history and exam, recent data reviewed with pt, and pt to continue medical treatment as before,  to f/u any worsening symptoms or concerns BP Readings from Last 3 Encounters:  04/30/16 128/74  04/25/16 138/78  04/15/16 120/64

## 2016-04-30 NOTE — Progress Notes (Signed)
Subjective:    Patient ID: Brooke [name], female    DOB: 12-31-1950, 65 y.o.   MRN: [id number]  HPI   Here with 7 days acute onset fever, facial pain, pressure, headache, general weakness and malaise, and greenish d/c, with mild ST and cough, but pt denies chest pain, wheezing, increased sob or doe, orthopnea, PND, increased LE swelling, palpitations, dizziness or syncope.  Was seen recently with tx given with levaquin and tussionex and has them at home, but not taking due to fear of meds after reading the side effects.  Cant say exactly what her concern was, but at least will not take the antibiotic prescribed. Also with incresaed recent allergy symptoms - Does have several wks ongoing nasal allergy symptoms with clearish congestion, itch and sneezing, without fever, pain, ST, cough, swelling or wheezing.  Past Medical History:  Diagnosis Date  . FH: CAD (coronary artery disease)   . High cholesterol   . Hypertension   . Obesity    Past Surgical History:  Procedure Laterality Date  . CARPAL TUNNEL RELEASE     bilateral  . HIP SURGERY      reports that she has never smoked. She does not have any smokeless tobacco history on file. She reports that she does not drink alcohol or use drugs. family history includes Brain cancer in her mother; CAD in her father and sister; Kidney disease in her sister; Lung cancer in her mother. Allergies  Allergen Reactions  . Penicillins Hives   Current Outpatient Prescriptions on File Prior to Visit  Medication Sig Dispense Refill  . amLODipine (NORVASC) 10 MG tablet Take 1 tablet (10 mg total) by mouth daily. 90 tablet 3  . aspirin 81 MG chewable tablet Chew 81 mg by mouth daily.    Marland Kitchen atenolol (TENORMIN) 25 MG tablet Take 1 tablet (25 mg total) by mouth daily. 90 tablet 3  . ferrous sulfate 325 (65 FE) MG tablet Take 324 mg by mouth daily with breakfast.    . ibuprofen (ADVIL,MOTRIN) 200 MG tablet Take 400 mg by mouth every 6 (six) hours as needed.  For pain    . olive oil external oil Apply 1 mg topically as needed.    . potassium chloride (K-DUR) 10 MEQ tablet Take 1 tablet (10 mEq total) by mouth 2 (two) times daily. 180 tablet 3  . traZODone (DESYREL) 50 MG tablet Take 0.5-1 tablets (25-50 mg total) by mouth at bedtime. 30 tablet 5  . triamterene-hydrochlorothiazide (MAXZIDE-25) 37.5-25 MG tablet Take 1 tablet by mouth daily. 90 tablet 3   No current facility-administered medications on file prior to visit.    Review of Systems  Constitutional: Negative for unusual diaphoresis or night sweats HENT: Negative for ear swelling or discharge Eyes: Negative for worsening visual haziness  Respiratory: Negative for choking and stridor.   Gastrointestinal: Negative for distension or worsening eructation Genitourinary: Negative for retention or change in urine volume.  Musculoskeletal: Negative for other MSK pain or swelling Skin: Negative for color change and worsening wound Neurological: Negative for tremors and numbness other than noted  Psychiatric/Behavioral: Negative for decreased concentration or agitation other than above   All other system neg per pt    Objective:   Physical Exam BP 128/74   Pulse 91   Temp 98.3 F (36.8 C) (Oral)   Resp 20   Wt 176 lb (79.8 kg)   SpO2 98%   BMI 33.25 kg/m  VS noted,  Constitutional: Pt appears in  no apparent distress HENT: Head: NCAT.  Right Ear: External ear normal.  Left Ear: External ear normal.  Bilat tm's with mild erythema.  Max sinus areas mild tender left > right with mod swelling.  Pharynx with mild erythema, no exudate Eyes: . Pupils are equal, round, and reactive to light. Conjunctivae and EOM are normal Neck: Normal range of motion. Neck supple.  Cardiovascular: Normal rate and regular rhythm.   Pulmonary/Chest: Effort normal and breath sounds without rales or wheezing.  Abd:  Soft, NT, ND, + BS Neurological: Pt is alert. Not confused , motor grossly intact Skin: Skin  is warm. No rash, no LE edema Psychiatric: Pt behavior is normal. No agitation.      Assessment & Plan:

## 2016-04-30 NOTE — Assessment & Plan Note (Signed)
Mild to mod, for zpak instead of levaquin, OK to take the tussionex and she agrees, also mucinex otc prn,  to f/u any worsening symptoms or concerns

## 2016-04-30 NOTE — Assessment & Plan Note (Signed)
Mild to mod, for OTC claritin asd,  to f/u any worsening symptoms or concerns

## 2016-04-30 NOTE — Progress Notes (Signed)
Pre visit review using our clinic review tool, if applicable. No additional management support is needed unless otherwise documented below in the visit note. 

## 2016-04-30 NOTE — Patient Instructions (Signed)
Please take all new medication as prescribed - the antibiotic  OK to take the cough medicine you have at home  You can also take Mucinex (or it's generic off brand) for congestion, and tylenol as needed for pain.  You can also take Claritin 10 mg (OTC)  - 1 per day for allergies if needed  Please continue all other medications as before, and refills have been done if requested.  Please have the pharmacy call with any other refills you may need.  Please keep your appointments with your specialists as you may have planned

## 2016-05-05 ENCOUNTER — Other Ambulatory Visit (INDEPENDENT_AMBULATORY_CARE_PROVIDER_SITE_OTHER): Payer: Medicare Other

## 2016-05-05 DIAGNOSIS — R7303 Prediabetes: Secondary | ICD-10-CM

## 2016-05-05 DIAGNOSIS — Z0000 Encounter for general adult medical examination without abnormal findings: Secondary | ICD-10-CM | POA: Diagnosis not present

## 2016-05-05 DIAGNOSIS — Z1159 Encounter for screening for other viral diseases: Secondary | ICD-10-CM

## 2016-05-05 DIAGNOSIS — D649 Anemia, unspecified: Secondary | ICD-10-CM

## 2016-05-05 LAB — CBC WITH DIFFERENTIAL/PLATELET
BASOS PCT: 1.1 % (ref 0.0–3.0)
Basophils Absolute: 0.1 10*3/uL (ref 0.0–0.1)
EOS ABS: 0.3 10*3/uL (ref 0.0–0.7)
EOS PCT: 3.8 % (ref 0.0–5.0)
HEMATOCRIT: 39.1 % (ref 36.0–46.0)
HEMOGLOBIN: 12.8 g/dL (ref 12.0–15.0)
LYMPHS PCT: 28.5 % (ref 12.0–46.0)
Lymphs Abs: 2.1 10*3/uL (ref 0.7–4.0)
MCHC: 32.8 g/dL (ref 30.0–36.0)
MCV: 71.1 fl — ABNORMAL LOW (ref 78.0–100.0)
Monocytes Absolute: 0.4 10*3/uL (ref 0.1–1.0)
Monocytes Relative: 5.3 % (ref 3.0–12.0)
Neutro Abs: 4.4 10*3/uL (ref 1.4–7.7)
Neutrophils Relative %: 61.3 % (ref 43.0–77.0)
Platelets: 281 10*3/uL (ref 150.0–400.0)
RBC: 5.49 Mil/uL — AB (ref 3.87–5.11)
RDW: 15.2 % (ref 11.5–15.5)
WBC: 7.2 10*3/uL (ref 4.0–10.5)

## 2016-05-05 LAB — COMPREHENSIVE METABOLIC PANEL
ALBUMIN: 4 g/dL (ref 3.5–5.2)
ALK PHOS: 86 U/L (ref 39–117)
ALT: 16 U/L (ref 0–35)
AST: 20 U/L (ref 0–37)
BILIRUBIN TOTAL: 0.7 mg/dL (ref 0.2–1.2)
BUN: 18 mg/dL (ref 6–23)
CALCIUM: 10.3 mg/dL (ref 8.4–10.5)
CHLORIDE: 101 meq/L (ref 96–112)
CO2: 27 mEq/L (ref 19–32)
CREATININE: 1.09 mg/dL (ref 0.40–1.20)
GFR: 64.38 mL/min (ref >60.00)
Glucose, Bld: 136 mg/dL — ABNORMAL HIGH (ref 70–99)
Potassium: 3.5 mEq/L (ref 3.5–5.1)
Sodium: 138 mEq/L (ref 135–145)
TOTAL PROTEIN: 8.1 g/dL (ref 6.0–8.3)

## 2016-05-05 LAB — LIPID PANEL
CHOLESTEROL: 228 mg/dL — AB (ref 0–200)
HDL: 56.7 mg/dL (ref >39.00)
LDL Cholesterol: 155 mg/dL — ABNORMAL HIGH (ref 0–99)
NONHDL: 170.94
Total CHOL/HDL Ratio: 4
Triglycerides: 82 mg/dL (ref 0.0–149.0)
VLDL: 16.4 mg/dL (ref 0.0–40.0)

## 2016-05-05 LAB — VITAMIN B12: Vitamin B-12: 819 pg/mL (ref 211–911)

## 2016-05-05 LAB — FERRITIN: FERRITIN: 200.8 ng/mL (ref 10.0–291.0)

## 2016-05-05 LAB — HEMOGLOBIN A1C: HEMOGLOBIN A1C: 6.2 % (ref 4.6–6.5)

## 2016-05-05 LAB — IRON: IRON: 110 ug/dL (ref 42–145)

## 2016-05-05 LAB — HEPATITIS C ANTIBODY: HCV AB: NEGATIVE

## 2016-05-05 LAB — TSH: TSH: 1.52 u[IU]/mL (ref 0.35–4.50)

## 2016-05-08 ENCOUNTER — Other Ambulatory Visit: Payer: Self-pay | Admitting: Internal Medicine

## 2016-05-08 DIAGNOSIS — E785 Hyperlipidemia, unspecified: Secondary | ICD-10-CM

## 2016-05-08 MED ORDER — ATORVASTATIN CALCIUM 20 MG PO TABS: 20.0000 mg | ORAL_TABLET | Freq: Every day | ORAL | 3 refills | Status: DC

## 2016-05-22 ENCOUNTER — Other Ambulatory Visit: Payer: Self-pay | Admitting: Internal Medicine

## 2016-06-05 ENCOUNTER — Other Ambulatory Visit: Payer: Self-pay | Admitting: Internal Medicine

## 2016-07-14 ENCOUNTER — Other Ambulatory Visit (INDEPENDENT_AMBULATORY_CARE_PROVIDER_SITE_OTHER): Payer: Medicare Other

## 2016-07-14 DIAGNOSIS — E785 Hyperlipidemia, unspecified: Secondary | ICD-10-CM

## 2016-07-14 LAB — COMPREHENSIVE METABOLIC PANEL
ALT: 30 U/L (ref 0–35)
AST: 27 U/L (ref 0–37)
Albumin: 4.2 g/dL (ref 3.5–5.2)
Alkaline Phosphatase: 92 U/L (ref 39–117)
BILIRUBIN TOTAL: 0.5 mg/dL (ref 0.2–1.2)
BUN: 17 mg/dL (ref 6–23)
CO2: 27 meq/L (ref 19–32)
CREATININE: 0.96 mg/dL (ref 0.40–1.20)
Calcium: 10.1 mg/dL (ref 8.4–10.5)
Chloride: 105 mEq/L (ref 96–112)
GFR: 74.5 mL/min (ref >60.00)
GLUCOSE: 106 mg/dL — AB (ref 70–99)
Potassium: 4.1 mEq/L (ref 3.5–5.1)
SODIUM: 142 meq/L (ref 135–145)
Total Protein: 7.8 g/dL (ref 6.0–8.3)

## 2016-07-14 LAB — LIPID PANEL
CHOL/HDL RATIO: 2
Cholesterol: 137 mg/dL (ref 0–200)
HDL: 63.7 mg/dL (ref >39.00)
LDL Cholesterol: 60 mg/dL (ref 0–99)
NONHDL: 73.74
Triglycerides: 67 mg/dL (ref 0.0–149.0)
VLDL: 13.4 mg/dL (ref 0.0–40.0)

## 2016-07-16 ENCOUNTER — Telehealth: Payer: Self-pay | Admitting: Internal Medicine

## 2016-07-16 NOTE — Telephone Encounter (Signed)
Got patient scheduled for AWV

## 2016-07-16 NOTE — Telephone Encounter (Signed)
Called patient to schedule awv appt. Left msg for patient to call office and schedule appt.

## 2016-07-23 DIAGNOSIS — H04213 Epiphora due to excess lacrimation, bilateral lacrimal glands: Secondary | ICD-10-CM | POA: Diagnosis not present

## 2016-07-23 DIAGNOSIS — H04322 Acute dacryocystitis of left lacrimal passage: Secondary | ICD-10-CM | POA: Diagnosis not present

## 2016-07-23 DIAGNOSIS — H04221 Epiphora due to insufficient drainage, right lacrimal gland: Secondary | ICD-10-CM | POA: Diagnosis not present

## 2016-07-28 DIAGNOSIS — H1089 Other conjunctivitis: Secondary | ICD-10-CM | POA: Diagnosis not present

## 2016-07-28 DIAGNOSIS — H04322 Acute dacryocystitis of left lacrimal passage: Secondary | ICD-10-CM | POA: Diagnosis not present

## 2016-07-30 DIAGNOSIS — H04322 Acute dacryocystitis of left lacrimal passage: Secondary | ICD-10-CM | POA: Diagnosis not present

## 2016-07-30 DIAGNOSIS — H5712 Ocular pain, left eye: Secondary | ICD-10-CM | POA: Diagnosis not present

## 2016-07-30 DIAGNOSIS — H04213 Epiphora due to excess lacrimation, bilateral lacrimal glands: Secondary | ICD-10-CM | POA: Diagnosis not present

## 2016-07-30 DIAGNOSIS — H04221 Epiphora due to insufficient drainage, right lacrimal gland: Secondary | ICD-10-CM | POA: Diagnosis not present

## 2016-08-11 ENCOUNTER — Ambulatory Visit (INDEPENDENT_AMBULATORY_CARE_PROVIDER_SITE_OTHER): Payer: Self-pay | Admitting: Orthopaedic Surgery

## 2016-08-15 NOTE — Progress Notes (Deleted)
Pre visit review using our clinic review tool, if applicable. No additional management support is needed unless otherwise documented below in the visit note. 

## 2016-08-15 NOTE — Progress Notes (Deleted)
Subjective:   Brooke [name] is a 66 y.o. female who presents for an Initial Medicare Annual Wellness Visit.  Review of Systems    No ROS.  Medicare Wellness Visit.     Sleep patterns: {SX; SLEEP PATTERNS:18802::"feels rested on waking","does not get up to void","gets up *** times nightly to void","sleeps *** hours nightly"}.   Home Safety/Smoke Alarms:   Living environment; residence and Firearm Safety: {Rehab home environment / accessibility:30080::"no firearms","firearms stored safely"}. Seat Belt Safety/Bike Helmet: Wears seat belt.   Counseling:   Eye Exam-  Dental-  Female:   Pap- N/A      Mammo- Last 03/16/15,BI-RADS CATEGORY  1: Negative       Dexa scan- none       CCS- Last 03/22/08, normal     Objective:    There were no vitals filed for this visit. There is no height or weight on file to calculate BMI.   Current Medications (verified) Outpatient Encounter Prescriptions as of 08/18/2016  Medication Sig  . amLODipine (NORVASC) 10 MG tablet Take 1 tablet (10 mg total) by mouth daily.  Marland Kitchen aspirin 81 MG chewable tablet Chew 81 mg by mouth daily.  Marland Kitchen atenolol (TENORMIN) 25 MG tablet Take 1 tablet (25 mg total) by mouth daily.  Marland Kitchen atorvastatin (LIPITOR) 20 MG tablet Take 1 tablet (20 mg total) by mouth daily.  Marland Kitchen azithromycin (ZITHROMAX Z-PAK) 250 MG tablet 2 tab on day 1, then 1 per day  . ferrous sulfate 325 (65 FE) MG tablet Take 324 mg by mouth daily with breakfast.  . ibuprofen (ADVIL,MOTRIN) 200 MG tablet Take 400 mg by mouth every 6 (six) hours as needed. For pain  . KLOR-CON M10 10 MEQ tablet TAKE ONE TABLET BY MOUTH TWICE DAILY  . olive oil external oil Apply 1 mg topically as needed.  . traZODone (DESYREL) 50 MG tablet Take 0.5-1 tablets (25-50 mg total) by mouth at bedtime.  . triamterene-hydrochlorothiazide (MAXZIDE-25) 37.5-25 MG tablet Take 1 tablet by mouth daily.  Marland Kitchen triamterene-hydrochlorothiazide (MAXZIDE-25) 37.5-25 MG tablet TAKE ONE TABLET BY MOUTH  ONCE DAILY   No facility-administered encounter medications on file as of 08/18/2016.     Allergies (verified) Penicillins   History: Past Medical History:  Diagnosis Date  . FH: CAD (coronary artery disease)   . High cholesterol   . Hypertension   . Obesity    Past Surgical History:  Procedure Laterality Date  . CARPAL TUNNEL RELEASE     bilateral  . HIP SURGERY     Family History  Problem Relation Age of Onset  . CAD Sister   . CAD Father   . Kidney disease Sister   . Lung cancer Mother   . Brain cancer Mother    Social History   Occupational History  . 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 Smoking status: Never Smoker  . Smokeless tobacco: Not on file  . Alcohol use No  . Drug use: No  . Sexual activity: Not on file    Tobacco Counseling Counseling given: Not Answered   Activities of Daily Living No flowsheet data found.  Immunizations and Health Maintenance Immunization History  Administered Date(s) Administered  . Influenza Whole 04/17/2009, 05/08/2010  . Influenza, High Dose Seasonal PF 03/30/2015, 04/15/2016  . Pneumococcal Polysaccharide-23 08/05/2012  . Td 06/09/1997, 03/30/2008  . Zoster 10/12/2013   Health Maintenance Due  Topic Date Due  .  PNA vac Low Risk Adult (1 of 2 - PCV13) 04/16/2014    Patient Care Team: Binnie Rail, MD as PCP - General (Internal Medicine)  Indicate any recent Medical Services you may have received from other than [facility] providers in the past year (date may be approximate).     Assessment:   This is a routine wellness examination for Brooke [name]. Physical assessment deferred to PCP.   Hearing/Vision screen No exam data present  Dietary issues and exercise activities discussed:   Diet (meal preparation, eat out, water intake, caffeinated beverages, dairy products, fruits and vegetables): {Desc; diets:16563} Breakfast: Lunch:  Dinner:      Goals    None      Depression Screen PHQ 2/9 Scores 04/15/2016 11/08/2014 11/08/2014  PHQ - 2 Score 0 0 3  PHQ- 9 Score - - 19    Fall Risk Fall Risk  04/15/2016 11/08/2014 11/08/2014 11/08/2014  Falls in the past year? No No - Yes  Number falls in past yr: - (No Data) 2 or more 1  Injury with Fall? - - - No    Cognitive Function:        Screening Tests Health Maintenance  Topic Date Due  . PNA vac Low Risk Adult (1 of 2 - PCV13) 04/16/2014  . DEXA SCAN  04/15/2017 (Originally 1951/05/10)  . MAMMOGRAM  03/15/2017  . COLONOSCOPY  03/22/2018  . TETANUS/TDAP  03/30/2018  . INFLUENZA VACCINE  Completed  . Hepatitis C Screening  Completed      Plan:    Continue to eat heart healthy diet (full of fruits, vegetables, whole grains, lean protein, water--limit salt, fat, and sugar intake) and increase physical activity as tolerated.  Continue doing brain stimulating activities (puzzles, reading, adult coloring books, staying active) to keep memory sharp.   During the course of the visit, Shakiyah was educated and counseled about the following appropriate screening and preventive services:   Vaccines to include Pneumoccal, Influenza, Hepatitis B, Td, Zostavax, HCV  Cardiovascular disease screening  Colorectal cancer screening  Bone density screening  Diabetes screening  Glaucoma screening  Mammography/PAP  Nutrition counseling   Patient Instructions (the written plan) were given to the patient.    Michiel Cowboy, RN   08/15/2016

## 2016-08-18 ENCOUNTER — Ambulatory Visit: Payer: Medicare Other

## 2016-08-19 ENCOUNTER — Ambulatory Visit: Payer: Medicare Other

## 2016-08-21 ENCOUNTER — Ambulatory Visit (INDEPENDENT_AMBULATORY_CARE_PROVIDER_SITE_OTHER): Payer: Medicare Other | Admitting: *Deleted

## 2016-08-21 VITALS — BP 128/64 | HR 68 | Resp 20 | Ht 61.0 in | Wt 181.0 lb

## 2016-08-21 DIAGNOSIS — E2839 Other primary ovarian failure: Secondary | ICD-10-CM

## 2016-08-21 DIAGNOSIS — Z0000 Encounter for general adult medical examination without abnormal findings: Secondary | ICD-10-CM | POA: Diagnosis not present

## 2016-08-21 DIAGNOSIS — Z1239 Encounter for other screening for malignant neoplasm of breast: Secondary | ICD-10-CM

## 2016-08-21 NOTE — Progress Notes (Addendum)
Subjective:   Brooke [name] is a 66 y.o. female who presents for an Initial Medicare Annual Wellness Visit.  Review of Systems    No ROS.  Medicare Wellness Visit.   Cardiac Risk Factors include: hypertension;dyslipidemia;family history of premature cardiovascular disease Sleep patterns: has interrupted sleep, feels rested on waking, gets up 2-3 times nightly to void and sleeps 6-7 hours nightly.   Home Safety/Smoke Alarms: Feels safe in home. Smoke alarms in place.    Living environment; residence and Firearm Safety: 1-story house/ trailer, no firearms.Lives with husband Seat Belt Safety/Bike Helmet: Wears seat belt.   Counseling:   Eye Exam- Last 2/18 Dental- dentures  Female:   Pap- N/A      Mammo-  Last 03/16/15, BI-RADS CATEGORY  1: Negative, placed referral today Dexa scan-  None, placed referral today     CCS- Last 03/22/08, normal     Objective:    Today's Vitals   08/21/16 1421  BP: 128/64  Pulse: 68  Resp: 20  SpO2: 99%  Weight: 181 lb (82.1 kg)  Height: 5\' 1"  (1.549 m)   Body mass index is 34.2 kg/m.   Current Medications (verified) Outpatient Encounter Prescriptions as of 08/21/2016  Medication Sig  . amLODipine (NORVASC) 10 MG tablet Take 1 tablet (10 mg total) by mouth daily.  Marland Kitchen aspirin 81 MG chewable tablet Chew 81 mg by mouth daily.  Marland Kitchen atenolol (TENORMIN) 25 MG tablet Take 1 tablet (25 mg total) by mouth daily.  Marland Kitchen atorvastatin (LIPITOR) 20 MG tablet Take 1 tablet (20 mg total) by mouth daily.  . [facility] BLACK COHOSH PO Take 1 tablet by mouth.  . ferrous sulfate 325 (65 FE) MG tablet Take 324 mg by mouth daily with breakfast.  . ibuprofen (ADVIL,MOTRIN) 200 MG tablet Take 400 mg by mouth every 6 (six) hours as needed. For pain  . KLOR-CON M10 10 MEQ tablet TAKE ONE TABLET BY MOUTH TWICE DAILY  . traZODone (DESYREL) 50 MG tablet Take 0.5-1 tablets (25-50 mg total) by mouth at bedtime.  . triamterene-hydrochlorothiazide (MAXZIDE-25) 37.5-25 MG tablet  Take 1 tablet by mouth daily.  . [DISCONTINUED] azithromycin (ZITHROMAX Z-PAK) 250 MG tablet 2 tab on day 1, then 1 per day (Patient not taking: Reported on 08/21/2016)  . [DISCONTINUED] olive oil external oil Apply 1 mg topically as needed.  . [DISCONTINUED] triamterene-hydrochlorothiazide (MAXZIDE-25) 37.5-25 MG tablet TAKE ONE TABLET BY MOUTH ONCE DAILY   No facility-administered encounter medications on file as of 08/21/2016.     Allergies (verified) Penicillins   History: Past Medical History:  Diagnosis Date  . FH: CAD (coronary artery disease)   . High cholesterol   . Hypertension   . Obesity    Past Surgical History:  Procedure Laterality Date  . CARPAL TUNNEL RELEASE     bilateral  . HIP SURGERY     Family History  Problem Relation Age of Onset  . CAD Sister   . CAD Father   . Kidney disease Sister   . Lung cancer Mother   . Brain cancer Mother    Social History   Occupational History  . 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 Smoking status: Never Smoker  . Smokeless tobacco: Never Used  . Alcohol use No  . Drug use: No  . Sexual activity: Not on file    Tobacco Counseling Counseling given: Not Answered   Activities of  Daily Living In your present state of health, do you have any difficulty performing the following activities: 08/21/2016  Hearing? N  Vision? N  Difficulty concentrating or making decisions? Y  Walking or climbing stairs? N  Dressing or bathing? N  Doing errands, shopping? N  Preparing Food and eating ? N  Using the Toilet? N  In the past six months, have you accidently leaked urine? N  Do you have problems with loss of bowel control? N  Managing your Medications? N  Managing your Finances? N  Housekeeping or managing your Housekeeping? N  Some recent data might be hidden    Immunizations and Health Maintenance Immunization History  Administered Date(s) Administered  . Influenza  Whole 04/17/2009, 05/08/2010  . Influenza, High Dose Seasonal PF 03/30/2015, 04/15/2016  . Pneumococcal Polysaccharide-23 08/05/2012  . Td 06/09/1997, 03/30/2008  . Zoster 10/12/2013   Health Maintenance Due  Topic Date Due  . PNA vac Low Risk Adult (1 of 2 - PCV13) 04/16/2014    Patient Care Team: Binnie Rail, MD as PCP - General (Internal Medicine)  Indicate any recent Medical Services you may have received from other than [facility] providers in the past year (date may be approximate).     Assessment:   This is a routine wellness examination for Brooke [name]. Physical assessment deferred to PCP.   Hearing/Vision screen Hearing Screening Comments: Able to hear conversational tones w/o difficulty. No issues reported.  Passed whisper Vision Screening Comments: Wears glasses  Dietary issues and exercise activities discussed: Current Exercise Habits: Home exercise routine, Type of exercise: walking;strength training/weights, Time (Minutes): 15, Frequency (Times/Week): 4, Weekly Exercise (Minutes/Week): 60, Intensity: Mild, Exercise limited by: None identified  Diet (meal preparation, eat out, water intake, caffeinated beverages, dairy products, fruits and vegetables): diabetic, low fat/ cholesterol, low salt  2-3 bottles of water daily  Encouraged patient to increase water intake and maintain healthy diet    Goals    . stay healthy          Continue to eat healthy and exercise.      Depression Screen PHQ 2/9 Scores 08/21/2016 04/15/2016 11/08/2014 11/08/2014  PHQ - 2 Score 0 0 0 3  PHQ- 9 Score - - - 19    Fall Risk Fall Risk  08/21/2016 04/15/2016 11/08/2014 11/08/2014 11/08/2014  Falls in the past year? No No No - Yes  Number falls in past yr: - - (No Data) 2 or more 1  Injury with Fall? - - - - No    Cognitive Function: MMSE - Mini Mental State Exam 08/21/2016  Not completed: Refused        Screening Tests Health Maintenance  Topic Date Due  . PNA vac Low Risk Adult (1 of 2 -  PCV13) 04/16/2014  . DEXA SCAN  04/15/2017 (Originally April 07, 1951)  . MAMMOGRAM  03/15/2017  . COLONOSCOPY  03/22/2018  . TETANUS/TDAP  03/30/2018  . INFLUENZA VACCINE  Completed  . Hepatitis C Screening  Completed      Plan:     Continue to eat heart healthy diet (full of fruits, vegetables, whole grains, lean protein, water--limit salt, fat, and sugar intake) and increase physical activity as tolerated.  Continue doing brain stimulating activities (puzzles, reading, adult coloring books, staying active) to keep memory sharp.   Go to [employer] for memory games  Referrals placed today for mammogram and bone density scan  During the course of the visit, Brooke [name] was educated and counseled about the following appropriate  screening and preventive services:   Vaccines to include Pneumoccal, Influenza, Hepatitis B, Td, Zostavax, HCV  Cardiovascular disease screening  Colorectal cancer screening  Bone density screening  Diabetes screening  Glaucoma screening  Mammography/PAP  Nutrition counseling  Patient Instructions (the written plan) were given to the patient.    Michiel Cowboy, RN   08/21/2016     Medical screening examination/treatment/procedure(s) were performed by non-physician practitioner and as supervising physician I was immediately available for consultation/collaboration. I agree with above. Binnie Rail, MD

## 2016-08-21 NOTE — Patient Instructions (Signed)
Continue to eat heart healthy diet (full of fruits, vegetables, whole grains, lean protein, water--limit salt, fat, and sugar intake) and increase physical activity as tolerated.  Continue doing brain stimulating activities (puzzles, reading, adult coloring books, staying active) to keep memory sharp.   [employer] website for memory games   Ms. Bussa , Thank you for taking time to come for your Medicare Wellness Visit. I appreciate your ongoing commitment to your health goals. Please review the following plan we discussed and let me know if I can assist you in the future.   These are the goals we discussed: Goals    None      This is a list of the screening recommended for you and due dates:  Health Maintenance  Topic Date Due  . Pneumonia vaccines (1 of 2 - PCV13) 04/16/2014  . DEXA scan (bone density measurement)  04/15/2017*  . Mammogram  03/15/2017  . Colon Cancer Screening  03/22/2018  . Tetanus Vaccine  03/30/2018  . Flu Shot  Completed  .  Hepatitis C: One time screening is recommended by Center for Disease Control  (CDC) for  adults born from [birth year] through 1967.   Completed  *Topic was postponed. The date shown is not the original due date.

## 2016-08-21 NOTE — Progress Notes (Signed)
Pre visit review using our clinic review tool, if applicable. No additional management support is needed unless otherwise documented below in the visit note. 

## 2016-09-24 DIAGNOSIS — H43391 Other vitreous opacities, right eye: Secondary | ICD-10-CM | POA: Diagnosis not present

## 2016-10-02 ENCOUNTER — Ambulatory Visit
Admission: RE | Admit: 2016-10-02 | Discharge: 2016-10-02 | Disposition: A | Discharge status: Home / Self Care | Payer: Medicare Other | Source: Ambulatory Visit | Attending: Internal Medicine | Admitting: Internal Medicine

## 2016-10-02 DIAGNOSIS — Z1231 Encounter for screening mammogram for malignant neoplasm of breast: Secondary | ICD-10-CM | POA: Diagnosis not present

## 2016-10-02 DIAGNOSIS — E2839 Other primary ovarian failure: Secondary | ICD-10-CM

## 2016-10-02 DIAGNOSIS — Z780 Asymptomatic menopausal state: Secondary | ICD-10-CM | POA: Diagnosis not present

## 2016-10-02 DIAGNOSIS — Z13820 Encounter for screening for osteoporosis: Secondary | ICD-10-CM | POA: Diagnosis not present

## 2016-10-02 DIAGNOSIS — Z1239 Encounter for other screening for malignant neoplasm of breast: Secondary | ICD-10-CM

## 2017-01-02 ENCOUNTER — Other Ambulatory Visit: Payer: Self-pay | Admitting: Internal Medicine

## 2017-01-13 DIAGNOSIS — H35373 Puckering of macula, bilateral: Secondary | ICD-10-CM | POA: Diagnosis not present

## 2017-01-13 DIAGNOSIS — H25013 Cortical age-related cataract, bilateral: Secondary | ICD-10-CM | POA: Diagnosis not present

## 2017-01-13 DIAGNOSIS — H35033 Hypertensive retinopathy, bilateral: Secondary | ICD-10-CM | POA: Diagnosis not present

## 2017-01-13 DIAGNOSIS — H2513 Age-related nuclear cataract, bilateral: Secondary | ICD-10-CM | POA: Diagnosis not present

## 2017-01-13 LAB — HM DIABETES EYE EXAM

## 2017-01-20 ENCOUNTER — Telehealth: Payer: Self-pay | Admitting: Internal Medicine

## 2017-01-20 ENCOUNTER — Encounter: Payer: Self-pay | Admitting: Internal Medicine

## 2017-01-20 NOTE — Telephone Encounter (Signed)
Spouse called stating he received a call from our office yesterday 8/13 around 3pm.  Wanted to know if you may have called.

## 2017-01-20 NOTE — Telephone Encounter (Signed)
I have not tried contacting pt or spouse

## 2017-03-25 ENCOUNTER — Encounter (INDEPENDENT_AMBULATORY_CARE_PROVIDER_SITE_OTHER): Payer: Self-pay | Admitting: Surgery

## 2017-03-25 ENCOUNTER — Ambulatory Visit (INDEPENDENT_AMBULATORY_CARE_PROVIDER_SITE_OTHER): Payer: Medicare Other

## 2017-03-25 ENCOUNTER — Ambulatory Visit (INDEPENDENT_AMBULATORY_CARE_PROVIDER_SITE_OTHER): Payer: Medicare Other | Admitting: Surgery

## 2017-03-25 DIAGNOSIS — M25531 Pain in right wrist: Secondary | ICD-10-CM

## 2017-03-25 NOTE — Progress Notes (Signed)
Office Visit Note   Patient: Brooke [name]           Date of Birth: May 27, 1951           MRN: [id number] Visit Date: 03/25/2017              Requested by: Binnie Rail, MD [place], [place] [address] PCP: Binnie Rail, MD   Assessment & Plan: Visit Diagnoses:  1. Pain in right wrist   Question dorsal wrist ganglion cyst versus tenosynovitis  Plan:  We'll attempt conservative treatment with pennsaid gel. Patient will apply twice daily. Can use ice off and on as needed. Follow-up in 3 weeks for recheck but before that appointment if pain and swelling have resolved she may cancel her appointment. If she continues to be symptomatic I will schedule MRI right wrist. All questions answered.    Follow-Up Instructions: Return in about 3 weeks (around 04/15/2017) for Jeneen Rinks.   Orders:  Orders Placed This Encounter  Procedures  . XR Wrist Complete Right   No orders of the defined types were placed in this encounter.     Procedures: No procedures performed   Clinical Data: No additional findings.   Subjective: Chief Complaint  Patient presents with  . Right Wrist - Pain, Follow-up    [place] [facility]  patient comes in today with complaints of right dorsal wrist pain and swelling. Problem ongoing for about 3-4 weeks. No injury. She has been wearing a wrist Aleve without any improvement. Taking oral NSAIDs also without any improvement. Bothersome with activity and worse at night. Patient has had right carpal tunnel release by Dr. Lorin Mercy in 2003 but is not complaining of any pain numbness and tingling in the hand. No personal or family history of rheumatoid arthritis or lupus.   Objective: Vital Signs: There were no vitals taken for this visit.  Physical Exam  Constitutional: She is oriented to person, place, and time. She appears well-developed. No distress.  HENT:  Head: Normocephalic and atraumatic.  Eyes: Pupils are equal, round, and reactive to light.  EOM are normal.  Neck: Normal range of motion.  Pulmonary/Chest: No respiratory distress.  Abdominal: She exhibits no distension.  Musculoskeletal:  Exam bilateral elbows unremarkable. Right wrist good range of motion. Negative Tinel's and Phalen's. Dorsal wrist over the distal radius she does have an area is slight swelling that is tender to palpation. Question this being a possible ganglion cyst. Wrist joint itself is nontender. No signs of infection. Neurovascularly intact.  Neurological: She is alert and oriented to person, place, and time.  Skin: Skin is warm and dry.  Psychiatric: She has a normal mood and affect.    Ortho Exam  Specialty Comments:  No specialty comments available.  Imaging: No results found.   PMFS History: Patient Active Problem List   Diagnosis Date Noted  . Acute sinus infection 04/30/2016  . Maxillary sinusitis 04/25/2016  . Sleep difficulties 04/15/2016  . Prediabetes 05/07/2015  . Memory difficulties 05/07/2015  . Bilateral leg edema 05/07/2015  . Anemia 10/12/2013  . FH: CAD (coronary artery disease) 04/27/2012  . OSTEOARTHRITIS 03/30/2008  . Hyperlipidemia 07/22/2007  . OBESITY NOS 02/22/2007  . Allergic rhinitis 02/22/2007  . Essential hypertension 02/18/2007   Past Medical History:  Diagnosis Date  . FH: CAD (coronary artery disease)   . High cholesterol   . Hypertension   . Obesity     Family History  Problem Relation Age of Onset  .  CAD Sister   . CAD Father   . Kidney disease Sister   . Lung cancer Mother   . Brain cancer Mother     Past Surgical History:  Procedure Laterality Date  . CARPAL TUNNEL RELEASE     bilateral  . HIP SURGERY     Social History   Occupational History  . 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 Smoking status: Never Smoker  . Smokeless tobacco: Never Used  . Alcohol use No  . Drug use: No  . Sexual activity: Not on file

## 2017-04-06 ENCOUNTER — Telehealth: Payer: Self-pay | Admitting: Internal Medicine

## 2017-04-06 NOTE — Telephone Encounter (Signed)
Pt called in and needs enough meds to make it to the 9th of nov   potassium chloride (KLOR-CON M10) 10 MEQ tablet [[id number]]   Pharmacy on file

## 2017-04-07 MED ORDER — POTASSIUM CHLORIDE CRYS ER 10 MEQ PO TBCR: 10.0000 meq | EXTENDED_RELEASE_TABLET | Freq: Two times a day (BID) | ORAL | 0 refills | Status: DC

## 2017-04-07 NOTE — Telephone Encounter (Signed)
Sent 2 week supply to [facility]...Brooke [name]

## 2017-04-16 ENCOUNTER — Encounter: Payer: Self-pay | Admitting: Internal Medicine

## 2017-04-16 NOTE — Progress Notes (Signed)
Subjective:    Patient ID: Brooke [name], female    DOB: 17-Dec-1950, 66 y.o.   MRN: [id number]  HPI She is here for a physical exam.   Last summer she was going to a prediabetes classes, but has backed off and needs to started eating better.  She is active, but not exercising regularly.    Medications and allergies reviewed with patient and updated if appropriate.  Patient Active Problem List   Diagnosis Date Noted  . Sleep difficulties 04/15/2016  . Prediabetes 05/07/2015  . Memory difficulties 05/07/2015  . Bilateral leg edema 05/07/2015  . Anemia 10/12/2013  . FH: CAD (coronary artery disease) 04/27/2012  . OSTEOARTHRITIS 03/30/2008  . Hyperlipidemia 07/22/2007  . OBESITY NOS 02/22/2007  . Allergic rhinitis 02/22/2007  . Essential hypertension 02/18/2007    Current Outpatient Medications on File Prior to Visit  Medication Sig Dispense Refill  . amLODipine (NORVASC) 10 MG tablet Take 1 tablet (10 mg total) by mouth daily. 90 tablet 3  . aspirin 81 MG chewable tablet Chew 81 mg by mouth daily.    Marland Kitchen atenolol (TENORMIN) 25 MG tablet Take 1 tablet (25 mg total) by mouth daily. 90 tablet 3  . atorvastatin (LIPITOR) 20 MG tablet Take 1 tablet (20 mg total) by mouth daily. 90 tablet 3  . [facility] BLACK COHOSH PO Take 1 tablet by mouth.    . ferrous sulfate 325 (65 FE) MG tablet Take 324 mg by mouth daily with breakfast.    . ibuprofen (ADVIL,MOTRIN) 200 MG tablet Take 400 mg by mouth every 6 (six) hours as needed. For pain    . potassium chloride (KLOR-CON M10) 10 MEQ tablet Take 1 tablet (10 mEq total) by mouth 2 (two) times daily. Must keep schedule appt for future refills 20 tablet 0  . traZODone (DESYREL) 50 MG tablet Take 0.5-1 tablets (25-50 mg total) by mouth at bedtime. 30 tablet 5  . triamterene-hydrochlorothiazide (MAXZIDE-25) 37.5-25 MG tablet Take 1 tablet by mouth daily. 90 tablet 3   No current facility-administered medications on file prior to visit.     Past  Medical History:  Diagnosis Date  . FH: CAD (coronary artery disease)   . High cholesterol   . Hypertension   . Obesity     Past Surgical History:  Procedure Laterality Date  . CARPAL TUNNEL RELEASE     bilateral  . HIP SURGERY      Social History   Socioeconomic History  . Marital status: Married    Spouse name: None  . Number of children: 2  . Years of education: 12th   . Highest education level: None  Social Needs  . Financial resource strain: None  . Food insecurity - worry: None  . Food insecurity - inability: None  . Transportation needs - medical: None  . Transportation needs - non-medical: None  Occupational History  . Occupation: Psychologist, sport and exercise for [employer]    Comment: Laid Off  Tobacco Use  . Smoking status: Never Smoker  . Smokeless tobacco: Never Used  Substance and Sexual Activity  . Alcohol use: No  . Drug use: No  . Sexual activity: None  Other Topics Concern  . 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         Family History  Problem Relation Age of Onset  . CAD Sister   . CAD Father   . Kidney  disease Sister   . Lung cancer Mother   . Brain cancer Mother     Review of Systems  Constitutional: Negative for chills and fever.  Eyes: Negative for visual disturbance.  Respiratory: Negative for cough, shortness of breath and wheezing.   Cardiovascular: Negative for chest pain, palpitations and leg swelling.  Gastrointestinal: Negative for abdominal pain, blood in stool, constipation, diarrhea and nausea.       No gerd  Genitourinary: Positive for urgency. Negative for dysuria and hematuria.  Musculoskeletal: Negative for arthralgias, back pain and myalgias.  Skin: Negative for color change and rash.  Neurological: Negative for dizziness, light-headedness and headaches.  Psychiatric/Behavioral: Negative for dysphoric mood. The patient is not nervous/anxious.          Objective:   Vitals:   04/17/17 1509  BP: 130/76  Pulse: (!) 52  Temp: 98.4 F (36.9 C)  SpO2: 98%   Filed Weights   04/17/17 1509  Weight: 183 lb (83 kg)   Body mass index is 34.58 kg/m.  Wt Readings from Last 3 Encounters:  04/17/17 183 lb (83 kg)  08/21/16 181 lb (82.1 kg)  04/30/16 176 lb (79.8 kg)     Physical Exam Constitutional: She appears well-developed and well-nourished. No distress.  HENT:  Head: Normocephalic and atraumatic.  Right Ear: External ear normal. Normal ear canal and TM Left Ear: External ear normal.  Normal ear canal and TM Mouth/Throat: Oropharynx is clear and moist.  Eyes: Conjunctivae and EOM are normal.  Neck: Neck supple. No tracheal deviation present. No thyromegaly present.  No carotid bruit  Cardiovascular: Normal rate, regular rhythm and normal heart sounds.   No murmur heard.  No edema. Pulmonary/Chest: Effort normal and breath sounds normal. No respiratory distress. She has no wheezes. She has no rales.  Breast: deferred  Abdominal: Soft. She exhibits no distension. There is no tenderness.  Lymphadenopathy: She has no cervical adenopathy.  Skin: Skin is warm and dry. She is not diaphoretic.  Psychiatric: She has a normal mood and affect. Her behavior is normal.        Assessment & Plan:   Physical exam: Screening blood work  ordered Immunizations  Discussed shingrix, prevnar due  - deferred today, flu vaccine today Colonoscopy  Up to date  Mammogram   Up to date  60 - no longer goes Dexa   Up to date  Eye exams   Up to date  EKG  Last done 2015 Exercise  Minimal - encouraged increased exercise Weight  Advised weight loss Skin  No concerns Substance abuse     none  See Problem List for Assessment and Plan of chronic medical problems.

## 2017-04-16 NOTE — Patient Instructions (Addendum)
Test(s) ordered today. Your results will be released to MyChart (or called to you) after review, usually within 72hours after test completion. If any changes need to be made, you will be notified at that same time.  All other Health Maintenance issues reviewed.   All recommended immunizations and age-appropriate screenings are up-to-date or discussed.  Flu immunization administered today.   Medications reviewed and updated.  No changes recommended at this time.  Your prescription(s) have been submitted to your pharmacy. Please take as directed and contact our office if you believe you are having problem(s) with the medication(s).   Please followup in 6 months   Health Maintenance, Female Adopting a healthy lifestyle and getting preventive care can go a long way to promote health and wellness. Talk with your health care provider about what schedule of regular examinations is right for you. This is a good chance for you to check in with your provider about disease prevention and staying healthy. In between checkups, there are plenty of things you can do on your own. Experts have done a lot of research about which lifestyle changes and preventive measures are most likely to keep you healthy. Ask your health care provider for more information. Weight and diet Eat a healthy diet  Be sure to include plenty of vegetables, fruits, low-fat dairy products, and lean protein.  Do not eat a lot of foods high in solid fats, added sugars, or salt.  Get regular exercise. This is one of the most important things you can do for your health. ? Most adults should exercise for at least 150 minutes each week. The exercise should increase your heart rate and make you sweat (moderate-intensity exercise). ? Most adults should also do strengthening exercises at least twice a week. This is in addition to the moderate-intensity exercise.  Maintain a healthy weight  Body mass index (BMI) is a measurement that can  be used to identify possible weight problems. It estimates body fat based on height and weight. Your health care provider can help determine your BMI and help you achieve or maintain a healthy weight.  For females 20 years of age and older: ? A BMI below 18.5 is considered underweight. ? A BMI of 18.5 to 24.9 is normal. ? A BMI of 25 to 29.9 is considered overweight. ? A BMI of 30 and above is considered obese.  Watch levels of cholesterol and blood lipids  You should start having your blood tested for lipids and cholesterol at 68 years of age, then have this test every 5 years.  You may need to have your cholesterol levels checked more often if: ? Your lipid or cholesterol levels are high. ? You are older than 68 years of age. ? You are at high risk for heart disease.  Cancer screening Lung Cancer  Lung cancer screening is recommended for adults 55-80 years old who are at high risk for lung cancer because of a history of smoking.  A yearly low-dose CT scan of the lungs is recommended for people who: ? Currently smoke. ? Have quit within the past 15 years. ? Have at least a 30-pack-year history of smoking. A pack year is smoking an average of one pack of cigarettes a day for 1 year.  Yearly screening should continue until it has been 15 years since you quit.  Yearly screening should stop if you develop a health problem that would prevent you from having lung cancer treatment.  Breast Cancer  Practice breast self-awareness.   means understanding how your breasts normally appear and feel.  It also means doing regular breast self-exams. Let your health care provider know about any changes, no matter how small.  If you are in your 20s or 30s, you should have a clinical breast exam (CBE) by a health care provider every 1-3 years as part of a regular health exam.  If you are 40 or older, have a CBE every year. Also consider having a breast X-ray (mammogram) every year.  If you  have a family history of breast cancer, talk to your health care provider about genetic screening.  If you are at high risk for breast cancer, talk to your health care provider about having an MRI and a mammogram every year.  Breast cancer gene (BRCA) assessment is recommended for women who have family members with BRCA-related cancers. BRCA-related cancers include: ? Breast. ? Ovarian. ? Tubal. ? Peritoneal cancers.  Results of the assessment will determine the need for genetic counseling and BRCA1 and BRCA2 testing.  Cervical Cancer Your health care provider may recommend that you be screened regularly for cancer of the pelvic organs (ovaries, uterus, and vagina). This screening involves a pelvic examination, including checking for microscopic changes to the surface of your cervix (Pap test). You may be encouraged to have this screening done every 3 years, beginning at age 21.  For women ages 30-65, health care providers may recommend pelvic exams and Pap testing every 3 years, or they may recommend the Pap and pelvic exam, combined with testing for human papilloma virus (HPV), every 5 years. Some types of HPV increase your risk of cervical cancer. Testing for HPV may also be done on women of any age with unclear Pap test results.  Other health care providers may not recommend any screening for nonpregnant women who are considered low risk for pelvic cancer and who do not have symptoms. Ask your health care provider if a screening pelvic exam is right for you.  If you have had past treatment for cervical cancer or a condition that could lead to cancer, you need Pap tests and screening for cancer for at least 20 years after your treatment. If Pap tests have been discontinued, your risk factors (such as having a new sexual partner) need to be reassessed to determine if screening should resume. Some women have medical problems that increase the chance of getting cervical cancer. In these cases,  your health care provider may recommend more frequent screening and Pap tests.  Colorectal Cancer  This type of cancer can be detected and often prevented.  Routine colorectal cancer screening usually begins at 68 years of age and continues through 68 years of age.  Your health care provider may recommend screening at an earlier age if you have risk factors for colon cancer.  Your health care provider may also recommend using home test kits to check for hidden blood in the stool.  A small camera at the end of a tube can be used to examine your colon directly (sigmoidoscopy or colonoscopy). This is done to check for the earliest forms of colorectal cancer.  Routine screening usually begins at age 50.  Direct examination of the colon should be repeated every 5-10 years through 68 years of age. However, you may need to be screened more often if early forms of precancerous polyps or small growths are found.  Skin Cancer  Check your skin from head to toe regularly.  Tell your health care provider about any new   moles or changes in moles, especially if there is a change in a mole's shape or color.  Also tell your health care provider if you have a mole that is larger than the size of a pencil eraser.  Always use sunscreen. Apply sunscreen liberally and repeatedly throughout the day.  Protect yourself by wearing long sleeves, pants, a wide-brimmed hat, and sunglasses whenever you are outside.  Heart disease, diabetes, and high blood pressure  High blood pressure causes heart disease and increases the risk of stroke. High blood pressure is more likely to develop in: ? People who have blood pressure in the high end of the normal range (130-139/85-89 mm Hg). ? People who are overweight or obese. ? People who are African American.  If you are 18-39 years of age, have your blood pressure checked every 3-5 years. If you are 40 years of age or older, have your blood pressure checked every year.  You should have your blood pressure measured twice-once when you are at a hospital or clinic, and once when you are not at a hospital or clinic. Record the average of the two measurements. To check your blood pressure when you are not at a hospital or clinic, you can use: ? An automated blood pressure machine at a pharmacy. ? A home blood pressure monitor.  If you are between 55 years and 79 years old, ask your health care provider if you should take aspirin to prevent strokes.  Have regular diabetes screenings. This involves taking a blood sample to check your fasting blood sugar level. ? If you are at a normal weight and have a low risk for diabetes, have this test once every three years after 68 years of age. ? If you are overweight and have a high risk for diabetes, consider being tested at a younger age or more often. Preventing infection Hepatitis B  If you have a higher risk for hepatitis B, you should be screened for this virus. You are considered at high risk for hepatitis B if: ? You were born in a country where hepatitis B is common. Ask your health care provider which countries are considered high risk. ? Your parents were born in a high-risk country, and you have not been immunized against hepatitis B (hepatitis B vaccine). ? You have HIV or AIDS. ? You use needles to inject street drugs. ? You live with someone who has hepatitis B. ? You have had sex with someone who has hepatitis B. ? You get hemodialysis treatment. ? You take certain medicines for conditions, including cancer, organ transplantation, and autoimmune conditions.  Hepatitis C  Blood testing is recommended for: ? Everyone born from 1945 through 1965. ? Anyone with known risk factors for hepatitis C.  Sexually transmitted infections (STIs)  You should be screened for sexually transmitted infections (STIs) including gonorrhea and chlamydia if: ? You are sexually active and are younger than 68 years of  age. ? You are older than 68 years of age and your health care provider tells you that you are at risk for this type of infection. ? Your sexual activity has changed since you were last screened and you are at an increased risk for chlamydia or gonorrhea. Ask your health care provider if you are at risk.  If you do not have HIV, but are at risk, it may be recommended that you take a prescription medicine daily to prevent HIV infection. This is called pre-exposure prophylaxis (PrEP). You are considered at risk   if: ? You are sexually active and do not regularly use condoms or know the HIV status of your partner(s). ? You take drugs by injection. ? You are sexually active with a partner who has HIV.  Talk with your health care provider about whether you are at high risk of being infected with HIV. If you choose to begin PrEP, you should first be tested for HIV. You should then be tested every 3 months for as long as you are taking PrEP. Pregnancy  If you are premenopausal and you may become pregnant, ask your health care provider about preconception counseling.  If you may become pregnant, take 400 to 800 micrograms (mcg) of folic acid every day.  If you want to prevent pregnancy, talk to your health care provider about birth control (contraception). Osteoporosis and menopause  Osteoporosis is a disease in which the bones lose minerals and strength with aging. This can result in serious bone fractures. Your risk for osteoporosis can be identified using a bone density scan.  If you are 75 years of age or older, or if you are at risk for osteoporosis and fractures, ask your health care provider if you should be screened.  Ask your health care provider whether you should take a calcium or vitamin D supplement to lower your risk for osteoporosis.  Menopause may have certain physical symptoms and risks.  Hormone replacement therapy may reduce some of these symptoms and risks. Talk to your health  care provider about whether hormone replacement therapy is right for you. Follow these instructions at home:  Schedule regular health, dental, and eye exams.  Stay current with your immunizations.  Do not use any tobacco products including cigarettes, chewing tobacco, or electronic cigarettes.  If you are pregnant, do not drink alcohol.  If you are breastfeeding, limit how much and how often you drink alcohol.  Limit alcohol intake to no more than 1 drink per day for nonpregnant women. One drink equals 12 ounces of beer, 5 ounces of wine, or 1 ounces of hard liquor.  Do not use street drugs.  Do not share needles.  Ask your health care provider for help if you need support or information about quitting drugs.  Tell your health care provider if you often feel depressed.  Tell your health care provider if you have ever been abused or do not feel safe at home. This information is not intended to replace advice given to you by your health care provider. Make sure you discuss any questions you have with your health care provider. Document Released: 12/09/2010 Document Revised: 11/01/2015 Document Reviewed: 02/27/2015 Elsevier Interactive Patient Education  [employer].

## 2017-04-17 ENCOUNTER — Other Ambulatory Visit (INDEPENDENT_AMBULATORY_CARE_PROVIDER_SITE_OTHER): Payer: Medicare Other

## 2017-04-17 ENCOUNTER — Ambulatory Visit (INDEPENDENT_AMBULATORY_CARE_PROVIDER_SITE_OTHER): Payer: Medicare Other | Admitting: Internal Medicine

## 2017-04-17 ENCOUNTER — Encounter: Payer: Self-pay | Admitting: Internal Medicine

## 2017-04-17 ENCOUNTER — Other Ambulatory Visit: Payer: Medicare Other

## 2017-04-17 VITALS — BP 130/76 | HR 52 | Temp 98.4°F | Ht 61.0 in | Wt 183.0 lb

## 2017-04-17 DIAGNOSIS — R600 Localized edema: Secondary | ICD-10-CM | POA: Diagnosis not present

## 2017-04-17 DIAGNOSIS — R7303 Prediabetes: Secondary | ICD-10-CM

## 2017-04-17 DIAGNOSIS — G479 Sleep disorder, unspecified: Secondary | ICD-10-CM | POA: Diagnosis not present

## 2017-04-17 DIAGNOSIS — E785 Hyperlipidemia, unspecified: Secondary | ICD-10-CM

## 2017-04-17 DIAGNOSIS — Z0000 Encounter for general adult medical examination without abnormal findings: Secondary | ICD-10-CM

## 2017-04-17 DIAGNOSIS — Z23 Encounter for immunization: Secondary | ICD-10-CM

## 2017-04-17 DIAGNOSIS — I10 Essential (primary) hypertension: Secondary | ICD-10-CM

## 2017-04-17 LAB — CBC WITH DIFFERENTIAL/PLATELET
BASOS ABS: 0.1 10*3/uL (ref 0.0–0.1)
Basophils Relative: 1.3 % (ref 0.0–3.0)
EOS ABS: 0.3 10*3/uL (ref 0.0–0.7)
Eosinophils Relative: 4.1 % (ref 0.0–5.0)
HEMATOCRIT: 38.7 % (ref 36.0–46.0)
Hemoglobin: 12.2 g/dL (ref 12.0–15.0)
LYMPHS ABS: 2.2 10*3/uL (ref 0.7–4.0)
LYMPHS PCT: 32 % (ref 12.0–46.0)
MCHC: 31.5 g/dL (ref 30.0–36.0)
MCV: 73.6 fl — AB (ref 78.0–100.0)
MONOS PCT: 7.8 % (ref 3.0–12.0)
Monocytes Absolute: 0.5 10*3/uL (ref 0.1–1.0)
NEUTROS PCT: 54.8 % (ref 43.0–77.0)
Neutro Abs: 3.7 10*3/uL (ref 1.4–7.7)
PLATELETS: 237 10*3/uL (ref 150.0–400.0)
RBC: 5.26 Mil/uL — AB (ref 3.87–5.11)
RDW: 15.4 % (ref 11.5–15.5)
WBC: 6.7 10*3/uL (ref 4.0–10.5)

## 2017-04-17 LAB — COMPREHENSIVE METABOLIC PANEL
ALBUMIN: 3.8 g/dL (ref 3.5–5.2)
ALK PHOS: 82 U/L (ref 39–117)
ALT: 20 U/L (ref 0–35)
AST: 24 U/L (ref 0–37)
BILIRUBIN TOTAL: 0.6 mg/dL (ref 0.2–1.2)
BUN: 19 mg/dL (ref 6–23)
CALCIUM: 10.1 mg/dL (ref 8.4–10.5)
CO2: 29 mEq/L (ref 19–32)
CREATININE: 1.09 mg/dL (ref 0.40–1.20)
Chloride: 101 mEq/L (ref 96–112)
GFR: 64.2 mL/min (ref >60.00)
Glucose, Bld: 129 mg/dL — ABNORMAL HIGH (ref 70–99)
Potassium: 3.6 mEq/L (ref 3.5–5.1)
Sodium: 139 mEq/L (ref 135–145)
TOTAL PROTEIN: 7.7 g/dL (ref 6.0–8.3)

## 2017-04-17 LAB — LIPID PANEL
CHOLESTEROL: 140 mg/dL (ref 0–200)
HDL: 55.5 mg/dL (ref >39.00)
LDL Cholesterol: 68 mg/dL (ref 0–99)
NonHDL: 84.32
TRIGLYCERIDES: 84 mg/dL (ref 0.0–149.0)
Total CHOL/HDL Ratio: 3
VLDL: 16.8 mg/dL (ref 0.0–40.0)

## 2017-04-17 LAB — TSH: TSH: 1.13 u[IU]/mL (ref 0.35–4.50)

## 2017-04-17 LAB — HEMOGLOBIN A1C: Hgb A1c MFr Bld: 6.1 % (ref 4.6–6.5)

## 2017-04-17 MED ORDER — TRAZODONE HCL 50 MG PO TABS: 25.0000 mg | ORAL_TABLET | Freq: Every day | ORAL | 3 refills | Status: DC

## 2017-04-17 MED ORDER — TRIAMTERENE-HCTZ 37.5-25 MG PO TABS: 1.0000 | ORAL_TABLET | Freq: Every day | ORAL | 3 refills | Status: DC

## 2017-04-17 MED ORDER — AMLODIPINE BESYLATE 10 MG PO TABS: 10.0000 mg | ORAL_TABLET | Freq: Every day | ORAL | 3 refills | Status: DC

## 2017-04-17 MED ORDER — POTASSIUM CHLORIDE CRYS ER 10 MEQ PO TBCR: 10.0000 meq | EXTENDED_RELEASE_TABLET | Freq: Two times a day (BID) | ORAL | 3 refills | Status: DC

## 2017-04-17 MED ORDER — ATENOLOL 25 MG PO TABS: 25.0000 mg | ORAL_TABLET | Freq: Every day | ORAL | 3 refills | Status: DC

## 2017-04-17 MED ORDER — ATORVASTATIN CALCIUM 20 MG PO TABS: 20.0000 mg | ORAL_TABLET | Freq: Every day | ORAL | 3 refills | Status: DC

## 2017-04-17 NOTE — Assessment & Plan Note (Signed)
BP well controlled Current regimen effective and well tolerated Continue current medications at current doses cmp  

## 2017-04-17 NOTE — Assessment & Plan Note (Signed)
Check lipid panel  Continue daily statin Regular exercise and healthy diet encouraged  

## 2017-04-17 NOTE — Assessment & Plan Note (Signed)
Denies edema, controlled continue maxzide

## 2017-04-17 NOTE — Assessment & Plan Note (Signed)
Controlled, stable Continue current dose of medication -  trazodone  

## 2017-04-17 NOTE — Assessment & Plan Note (Signed)
Check a1c Low sugar / carb diet Stressed regular exercise, weight loss  

## 2017-05-01 ENCOUNTER — Encounter: Payer: Self-pay | Admitting: Family Medicine

## 2017-05-01 ENCOUNTER — Ambulatory Visit (INDEPENDENT_AMBULATORY_CARE_PROVIDER_SITE_OTHER)
Admission: RE | Admit: 2017-05-01 | Discharge: 2017-05-01 | Disposition: A | Discharge status: Home / Self Care | Payer: Medicare Other | Source: Ambulatory Visit | Attending: Family Medicine | Admitting: Family Medicine

## 2017-05-01 ENCOUNTER — Ambulatory Visit (INDEPENDENT_AMBULATORY_CARE_PROVIDER_SITE_OTHER): Payer: Medicare Other | Admitting: Family Medicine

## 2017-05-01 VITALS — BP 130/70 | HR 76 | Temp 97.8°F | Ht 61.0 in | Wt 189.0 lb

## 2017-05-01 DIAGNOSIS — M79671 Pain in right foot: Secondary | ICD-10-CM

## 2017-05-01 DIAGNOSIS — S99921A Unspecified injury of right foot, initial encounter: Secondary | ICD-10-CM | POA: Diagnosis not present

## 2017-05-01 DIAGNOSIS — M7989 Other specified soft tissue disorders: Secondary | ICD-10-CM | POA: Diagnosis not present

## 2017-05-01 DIAGNOSIS — M25561 Pain in right knee: Secondary | ICD-10-CM | POA: Diagnosis not present

## 2017-05-01 DIAGNOSIS — R040 Epistaxis: Secondary | ICD-10-CM

## 2017-05-01 DIAGNOSIS — S8991XA Unspecified injury of right lower leg, initial encounter: Secondary | ICD-10-CM | POA: Diagnosis not present

## 2017-05-01 NOTE — Progress Notes (Signed)
Pre visit review using our clinic review tool, if applicable. No additional management support is needed unless otherwise documented below in the visit note. 

## 2017-05-01 NOTE — Patient Instructions (Signed)
Your X-rays look good. If the foot X-ray is read differently, I will let you know.  Ice/cold pack over area for 10-15 min every 2-3 hours while awake.  OK to take Tylenol 1000 mg (2 extra strength tabs) or 975 mg (3 regular strength tabs) every 6 hours as needed.  Triple antibiotic ointment (Neosporin) twice daily in your right nostril. You can use Vaseline also.   Let us know if you need anything.

## 2017-05-01 NOTE — Progress Notes (Signed)
Musculoskeletal Exam  Patient: Brooke [name] DOB: 03-27-1951  DOS: 05/01/2017  SUBJECTIVE:  Chief Complaint:   Chief Complaint  Patient presents with  . Fall    Brooke [name] is a 66 y.o.  female for evaluation and treatment of R knee pain.   Onset:  2 days ago.  Hit cement area outside of store and fell on R knee.  Location: R knee anteriorly and medial portion of R foot Character:  throbbing  Progression of issue: R knee has improved, R foot is unchanged Associated symptoms: swelling, bruising, difficulty ambulating Treatment: to date has been activity modification.   Neurovascular symptoms: no  She has also been having nosebleeds since this happened. No known injury, she did not hit her head or lose consciousness.   ROS: Musculoskeletal/Extremities: +R knee and foot pain Neurologic: no numbness, tingling no weakness   Past Medical History:  Diagnosis Date  . FH: CAD (coronary artery disease)   . High cholesterol   . Hypertension   . Obesity    Past Surgical History:  Procedure Laterality Date  . CARPAL TUNNEL RELEASE     bilateral  . HIP SURGERY     Family History  Problem Relation Age of Onset  . CAD Sister   . CAD Father   . Kidney disease Sister   . Lung cancer Mother   . Brain cancer Mother    Current Outpatient Medications  Medication Sig Dispense Refill  . amLODipine (NORVASC) 10 MG tablet Take 1 tablet (10 mg total) daily by mouth. 90 tablet 3  . aspirin 81 MG chewable tablet Chew 81 mg by mouth daily.    Marland Kitchen atenolol (TENORMIN) 25 MG tablet Take 1 tablet (25 mg total) daily by mouth. 90 tablet 3  . atorvastatin (LIPITOR) 20 MG tablet Take 1 tablet (20 mg total) daily by mouth. 90 tablet 3  . [facility] BLACK COHOSH PO Take 1 tablet by mouth.    . ferrous sulfate 325 (65 FE) MG tablet Take 324 mg by mouth daily with breakfast.    . ibuprofen (ADVIL,MOTRIN) 200 MG tablet Take 400 mg by mouth every 6 (six) hours as needed. For pain    . potassium  chloride (KLOR-CON M10) 10 MEQ tablet Take 1 tablet (10 mEq total) 2 (two) times daily by mouth. 180 tablet 3  . traZODone (DESYREL) 50 MG tablet Take 0.5-1 tablets (25-50 mg total) at bedtime by mouth. 90 tablet 3  . triamterene-hydrochlorothiazide (MAXZIDE-25) 37.5-25 MG tablet Take 1 tablet daily by mouth. 90 tablet 3   Allergies  Allergen Reactions  . Penicillins Hives   Social History   Socioeconomic History  . Marital status: Married    Objective: VITAL SIGNS: BP 130/70   Pulse 76   Temp 97.8 F (36.6 C) (Oral)   Ht 5\' 1"  (1.549 m)   Wt 189 lb (85.7 kg)   SpO2 96%   BMI 35.71 kg/m  Constitutional: Well formed, well developed. No acute distress. Cardiovascular: Brisk cap refill Thorax & Lungs: No accessory muscle use Extremities: No clubbing. No cyanosis. No edema.  Skin: Warm. Dry. See below for area regarding nose Musculoskeletal: R knee- TTP over ant patella R foot- TTP over medial MTP.   Normal active range of motion: yes.   Normal passive range of motion: yes Deformity: no Ecchymosis: yes over knee Tests positive: None Mild soft tiss swelling over knee, no effusion noted Neurologic: Normal sensory function. No focal deficits noted. DTR's equal and symmetry in  LE's. No clonus. Psychiatric: Normal mood. Age appropriate judgment and insight. Alert & oriented x 3.    Media Information     Assessment:  Acute pain of right knee - Plan: DG Knee Complete 4 Views Right  Right foot pain - Plan: DG Foot Complete Right  Bleeding from the nose  Plan: Xrays neg. Ice. Tylenol. Elevation. Activity as tolerated. Offered flat soled shoe but she declined. For nose, BID TAO, Vaseline in between if needed. F/u prn. The patient voiced understanding and agreement to the plan.   [place], DO 05/01/17  4:57 PM

## 2017-05-07 ENCOUNTER — Ambulatory Visit (INDEPENDENT_AMBULATORY_CARE_PROVIDER_SITE_OTHER): Payer: Medicare Other | Admitting: Orthopaedic Surgery

## 2017-05-11 ENCOUNTER — Encounter (INDEPENDENT_AMBULATORY_CARE_PROVIDER_SITE_OTHER): Payer: Self-pay | Admitting: Orthopaedic Surgery

## 2017-05-11 ENCOUNTER — Ambulatory Visit (INDEPENDENT_AMBULATORY_CARE_PROVIDER_SITE_OTHER): Payer: Medicare Other | Admitting: Orthopaedic Surgery

## 2017-05-11 DIAGNOSIS — M25561 Pain in right knee: Secondary | ICD-10-CM

## 2017-05-11 DIAGNOSIS — M79671 Pain in right foot: Secondary | ICD-10-CM

## 2017-05-11 NOTE — Progress Notes (Signed)
Office Visit Note   Patient: Brooke [name]           Date of Birth: 1951-05-15           MRN: [id number] Visit Date: 05/11/2017              Requested by: Binnie Rail, MD [place], [place] [id number] PCP: Binnie Rail, MD   Assessment & Plan: Visit Diagnoses:  1. Acute pain of right knee   2. Right foot pain     Plan: Impression is right knee contusion and right great toe sprain.  These findings were discussed with the patient.  Overall she seems to be getting better.  Recommend warm compresses and continue supportive treatment.  She is getting better overall.  NSAIDs at nighttime as needed.  Questions encouraged and answered.  Follow-up as needed. Total face to face encounter time was greater than 25 minutes and over half of this time was spent in counseling and/or coordination of care.  Follow-Up Instructions: Return if symptoms worsen or fail to improve.   Orders:  No orders of the defined types were placed in this encounter.  No orders of the defined types were placed in this encounter.     Procedures: No procedures performed   Clinical Data: No additional findings.   Subjective: Chief Complaint  Patient presents with  . Right Knee - Pain  . Right Foot - Pain    Patient comes in today for her right knee and right foot pain.  She managed a mechanical fall on 04/29/2017 and she feels that she continues to have pain.  She continues to have swelling and bruising over the anterior aspect of the knee over the prepatellar bursa.  She is also complaining of right toe pain.  Overall she does state that she feels like she is getting better but the pain is worse at night.  She denies any instability with walking.  Denies any numbness and tingling.  It does throb at night.    Review of Systems  Constitutional: Negative.   HENT: Negative.   Eyes: Negative.   Respiratory: Negative.   Cardiovascular: Negative.   Endocrine: Negative.   Musculoskeletal:  Negative.   Neurological: Negative.   Hematological: Negative.   Psychiatric/Behavioral: Negative.   All other systems reviewed and are negative.    Objective: Vital Signs: There were no vitals taken for this visit.  Physical Exam  Constitutional: She is oriented to person, place, and time. She appears well-developed and well-nourished.  HENT:  Head: Normocephalic and atraumatic.  Eyes: EOM are normal.  Neck: Neck supple.  Pulmonary/Chest: Effort normal.  Abdominal: Soft.  Neurological: She is alert and oriented to person, place, and time.  Skin: Skin is warm. Capillary refill takes less than 2 seconds.  Psychiatric: She has a normal mood and affect. Her behavior is normal. Judgment and thought content normal.  Nursing note and vitals reviewed.   Ortho Exam Right knee exam shows no joint effusion.  Collaterals and cruciates are stable.  No joint tenderness.  She has tenderness of the prepatellar bursa and over the ecchymosis.  Right great toe shows a mild bunion deformity.  She has mild discomfort with plantarflexion and dorsiflexion of the great toe. Specialty Comments:  No specialty comments available.  Imaging: No results found.   PMFS History: Patient Active Problem List   Diagnosis Date Noted  . Sleep difficulties 04/15/2016  . Prediabetes 05/07/2015  . Memory difficulties 05/07/2015  .  Bilateral leg edema 05/07/2015  . Anemia 10/12/2013  . FH: CAD (coronary artery disease) 04/27/2012  . OSTEOARTHRITIS 03/30/2008  . Hyperlipidemia 07/22/2007  . OBESITY NOS 02/22/2007  . Allergic rhinitis 02/22/2007  . Essential hypertension 02/18/2007   Past Medical History:  Diagnosis Date  . FH: CAD (coronary artery disease)   . High cholesterol   . Hypertension   . Obesity     Family History  Problem Relation Age of Onset  . CAD Sister   . CAD Father   . Kidney disease Sister   . Lung cancer Mother   . Brain cancer Mother     Past Surgical History:  Procedure  Laterality Date  . CARPAL TUNNEL RELEASE     bilateral  . HIP SURGERY     Social History   Occupational History  . Occupation: Psychologist, sport and exercise for [employer]    Comment: Laid Off  Tobacco Use  . Smoking status: Never Smoker  . Smokeless tobacco: Never Used  Substance and Sexual Activity  . Alcohol use: No  . Drug use: No  . Sexual activity: Not on file

## 2017-08-27 ENCOUNTER — Encounter: Payer: Self-pay | Admitting: Family Medicine

## 2017-08-27 ENCOUNTER — Ambulatory Visit (INDEPENDENT_AMBULATORY_CARE_PROVIDER_SITE_OTHER): Payer: Medicare Other | Admitting: Family Medicine

## 2017-08-27 VITALS — BP 136/78 | HR 68 | Temp 98.2°F | Ht 61.0 in | Wt 187.0 lb

## 2017-08-27 DIAGNOSIS — J069 Acute upper respiratory infection, unspecified: Secondary | ICD-10-CM | POA: Diagnosis not present

## 2017-08-27 DIAGNOSIS — R040 Epistaxis: Secondary | ICD-10-CM

## 2017-08-27 NOTE — Progress Notes (Signed)
Subjective:    Patient ID: Brooke [name], female    DOB: 1950-08-01, 67 y.o.   MRN: [id number]  Chief Complaint  Patient presents with  . Epistaxis  . Nasal Congestion    HPI Pt was seen today for acute concern.  She is followed by Dr. Billey Gosling.  Pt endorses head congestion times 2 days, subjective fever, rhinorrhea, and nosebleeds times 4-1/2 days.  Pt denies sore throat, cough, headache, facial pain, facial pressure.  Pt states she has been taking Alka-Seltzer plus and Mucinex for her symptoms with little relief.  Past Medical History:  Diagnosis Date  . FH: CAD (coronary artery disease)   . High cholesterol   . Hypertension   . Obesity     Allergies  Allergen Reactions  . Penicillins Hives    ROS General: Denies fever, chills, night sweats, changes in weight, changes in appetite  + subjective fever HEENT: Denies headaches, ear pain, changes in vision, rhinorrhea, sore throat   + epistaxis, nasal congestion CV: Denies CP, palpitations, SOB, orthopnea Pulm: Denies SOB, cough, wheezing GI: Denies abdominal pain, nausea, vomiting, diarrhea, constipation GU: Denies dysuria, hematuria, frequency, vaginal discharge Msk: Denies muscle cramps, joint pains Neuro: Denies weakness, numbness, tingling Skin: Denies rashes, bruising Psych: Denies depression, anxiety, hallucinations     Objective:    Blood pressure 136/78, pulse 68, temperature 98.2 F (36.8 C), temperature source Oral, height 5\' 1"  (1.549 m), weight 187 lb (84.8 kg), SpO2 98 %.   Gen. Pleasant, well-nourished, in no distress, normal affect   HEENT: [place]/AT, face symmetric, no scleral icterus, PERRLA, nares patent with clear drainage, bleeding noted in the right nares, pharynx without erythema or exudate. Lungs: no accessory muscle use, CTAB, no wheezes or rales Cardiovascular: RRR, no m/r/g, no peripheral edema Abdomen: BS present, soft, NT/ND Neuro:  A&Ox3, CN II-XII intact, normal gait   Wt Readings from  Last 3 Encounters:  08/27/17 187 lb (84.8 kg)  05/01/17 189 lb (85.7 kg)  04/17/17 183 lb (83 kg)    Lab Results  Component Value Date   WBC 6.7 04/17/2017   HGB 12.2 04/17/2017   HCT 38.7 04/17/2017   PLT 237.0 04/17/2017   GLUCOSE 129 (H) 04/17/2017   CHOL 140 04/17/2017   TRIG 84.0 04/17/2017   HDL 55.50 04/17/2017   LDLDIRECT 162.0 08/05/2012   LDLCALC 68 04/17/2017   ALT 20 04/17/2017   AST 24 04/17/2017   NA 139 04/17/2017   K 3.6 04/17/2017   CL 101 04/17/2017   CREATININE 1.09 04/17/2017   BUN 19 04/17/2017   CO2 29 04/17/2017   TSH 1.13 04/17/2017   INR 1.9 (H) 04/15/2008   HGBA1C 6.1 04/17/2017   MICROALBUR 24.2 (H) 03/19/2009    Assessment/Plan:  Viral URI -Supportive care -Given handout -Discussed proper use of nasal spray.  Patient to consider saline nasal spray.  Epistaxis -Patient declined offer to pack nose as mild bleeding noted on exam -Patient advised on the proper way to stop bleeding by pinching the nose for 10 minutes and leaning forward. -Discussed use of a humidifier in the house -Patient given handout -Patient given RTC or ED precautions.  Follow-up with PCP as needed  Grier Mitts, MD

## 2017-08-27 NOTE — Patient Instructions (Signed)
Nosebleed A nosebleed is when blood comes out of the nose. Nosebleeds are common. They are usually not a sign of a serious medical problem. Follow these instructions at home: When you have a nosebleed:  Sit down.  Tilt your head a little forward.  Follow these steps: 1. Pinch your nose with a clean towel or tissue. 2. Keep pinching your nose for 10 minutes. Do not let go. 3. After 10 minutes, let go of your nose. 4. If there is still bleeding, do these steps again. Keep doing these steps until the bleeding stops.  Do not put things in your nose to stop the bleeding.  Try not to lie down or put your head back.  Use a nose spray decongestant as told by your doctor.  Do not use petroleum jelly or mineral oil in your nose. These things can get into your lungs. After a nosebleed:  Try not to blow your nose or sniffle for several hours.  Try not to strain, lift, or bend at the waist for several days.  Use saline spray or a humidifier as told by your doctor.  Aspirin and blood-thinning medicines make bleeding more likely. If you take these medicines, ask your doctor if you should stop taking them, or if you should change how much you take. Do not stop taking the medicine unless your doctor tells you to. Contact a doctor if:  You have a fever.  You get nosebleeds often.  You are getting nosebleeds more often than usual.  You bruise very easily.  You have something stuck in your nose.  You have bleeding in your mouth.  You throw up (vomit) or cough up brown material.  You get a nosebleed after you start a new medicine. Get help right away if:  You have a nosebleed after you fall or hurt your head.  Your nosebleed does not go away after 20 minutes.  You feel dizzy or weak.  You have unusual bleeding from other parts of your body.  You have unusual bruising on other parts of your body.  You get sweaty.  You throw up blood. Summary  Nosebleeds are common. They are  usually not a sign of a serious medical problem.  When you have a nosebleed, sit down and tilt your head a little forward. Pinch your nose with a clean tissue.  After the bleeding stops, try not to blow your nose or sniffle for several hours. This information is not intended to replace advice given to you by your health care provider. Make sure you discuss any questions you have with your health care provider. Document Released: 03/04/2008 Document Revised: 09/05/2016 Document Reviewed: 09/05/2016 Elsevier Interactive Patient Education  2018 [employer].  Upper Respiratory Infection, Adult Most upper respiratory infections (URIs) are a viral infection of the air passages leading to the lungs. A URI affects the nose, throat, and upper air passages. The most common type of URI is nasopharyngitis and is typically referred to as "the common cold." URIs run their course and usually go away on their own. Most of the time, a URI does not require medical attention, but sometimes a bacterial infection in the upper airways can follow a viral infection. This is called a secondary infection. Sinus and middle ear infections are common types of secondary upper respiratory infections. Bacterial pneumonia can also complicate a URI. A URI can worsen asthma and chronic obstructive pulmonary disease (COPD). Sometimes, these complications can require emergency medical care and may be life threatening.  What are the causes? Almost all URIs are caused by viruses. A virus is a type of germ and can spread from one person to another. What increases the risk? You may be at risk for a URI if:  You smoke.  You have chronic heart or lung disease.  You have a weakened defense (immune) system.  You are very young or very old.  You have nasal allergies or asthma.  You work in crowded or poorly ventilated areas.  You work in health care facilities or schools.  What are the signs or symptoms? Symptoms typically  develop 2-3 days after you come in contact with a cold virus. Most viral URIs last 7-10 days. However, viral URIs from the influenza virus (flu virus) can last 14-18 days and are typically more severe. Symptoms may include:  Runny or stuffy (congested) nose.  Sneezing.  Cough.  Sore throat.  Headache.  Fatigue.  Fever.  Loss of appetite.  Pain in your forehead, behind your eyes, and over your cheekbones (sinus pain).  Muscle aches.  How is this diagnosed? Your health care provider may diagnose a URI by:  Physical exam.  Tests to check that your symptoms are not due to another condition such as: ? Strep throat. ? Sinusitis. ? Pneumonia. ? Asthma.  How is this treated? A URI goes away on its own with time. It cannot be cured with medicines, but medicines may be prescribed or recommended to relieve symptoms. Medicines may help:  Reduce your fever.  Reduce your cough.  Relieve nasal congestion.  Follow these instructions at home:  Take medicines only as directed by your health care provider.  Gargle warm saltwater or take cough drops to comfort your throat as directed by your health care provider.  Use a warm mist humidifier or inhale steam from a shower to increase air moisture. This may make it easier to breathe.  Drink enough fluid to keep your urine clear or pale yellow.  Eat soups and other clear broths and maintain good nutrition.  Rest as needed.  Return to work when your temperature has returned to normal or as your health care provider advises. You may need to stay home longer to avoid infecting others. You can also use a face mask and careful hand washing to prevent spread of the virus.  Increase the usage of your inhaler if you have asthma.  Do not use any tobacco products, including cigarettes, chewing tobacco, or electronic cigarettes. If you need help quitting, ask your health care provider. How is this prevented? The best way to protect  yourself from getting a cold is to practice good hygiene.  Avoid oral or hand contact with people with cold symptoms.  Wash your hands often if contact occurs.  There is no clear evidence that vitamin C, vitamin E, echinacea, or exercise reduces the chance of developing a cold. However, it is always recommended to get plenty of rest, exercise, and practice good nutrition. Contact a health care provider if:  You are getting worse rather than better.  Your symptoms are not controlled by medicine.  You have chills.  You have worsening shortness of breath.  You have brown or red mucus.  You have yellow or brown nasal discharge.  You have pain in your face, especially when you bend forward.  You have a fever.  You have swollen neck glands.  You have pain while swallowing.  You have white areas in the back of your throat. Get help right away  if:  You have severe or persistent: ? Headache. ? Ear pain. ? Sinus pain. ? Chest pain.  You have chronic lung disease and any of the following: ? Wheezing. ? Prolonged cough. ? Coughing up blood. ? A change in your usual mucus.  You have a stiff neck.  You have changes in your: ? Vision. ? Hearing. ? Thinking. ? Mood. This information is not intended to replace advice given to you by your health care provider. Make sure you discuss any questions you have with your health care provider. Document Released: 11/19/2000 Document Revised: 01/27/2016 Document Reviewed: 08/31/2013 Elsevier Interactive Patient Education  [employer].

## 2017-09-29 ENCOUNTER — Telehealth: Payer: Self-pay | Admitting: Emergency Medicine

## 2017-09-29 NOTE — Telephone Encounter (Signed)
Called patient to schedule AWV. Patient did not answer and voicemail not set up at this time.

## 2018-01-11 ENCOUNTER — Ambulatory Visit (INDEPENDENT_AMBULATORY_CARE_PROVIDER_SITE_OTHER): Payer: Medicare Other | Admitting: Physician Assistant

## 2018-01-11 ENCOUNTER — Ambulatory Visit (INDEPENDENT_AMBULATORY_CARE_PROVIDER_SITE_OTHER): Payer: Medicare Other

## 2018-01-11 ENCOUNTER — Encounter (INDEPENDENT_AMBULATORY_CARE_PROVIDER_SITE_OTHER): Payer: Self-pay | Admitting: Physician Assistant

## 2018-01-11 DIAGNOSIS — M79672 Pain in left foot: Secondary | ICD-10-CM | POA: Insufficient documentation

## 2018-01-11 MED ORDER — LIDOCAINE HCL 1 % IJ SOLN
0.3300 mL | INTRAMUSCULAR | Status: AC | PRN
  Administered 2018-01-11: .33 mL

## 2018-01-11 MED ORDER — BUPIVACAINE HCL 0.25 % IJ SOLN
0.3300 mL | INTRAMUSCULAR | Status: AC | PRN
  Administered 2018-01-11: .33 mL

## 2018-01-11 MED ORDER — METHYLPREDNISOLONE ACETATE 40 MG/ML IJ SUSP
13.3300 mg | INTRAMUSCULAR | Status: AC | PRN
  Administered 2018-01-11: 13.33 mg

## 2018-01-11 NOTE — Progress Notes (Signed)
Office Visit Note   Patient: Brooke [name]           Date of Birth: March 31, 1951           MRN: [id number] Visit Date: 01/11/2018              Requested by: Binnie Rail, MD [place], [place] [address] PCP: Binnie Rail, MD   Assessment & Plan: Visit Diagnoses:  1. Pain of left heel     Plan: Impression is left foot plantar fasciitis.  Today, we will proceed with cortisone injection.  We will also provide the patient with an Achilles exercise stretching program.  I have told her that this can take several months before her pain improves.  I have also encouraged her to pick up a pair of orthotics.  She will follow-up with [place] as needed.  Call with concerns or questions in the meantime.  Follow-Up Instructions: Return if symptoms worsen or fail to improve.   Orders:  Orders Placed This Encounter  Procedures  . Large Joint Inj  . Medium Joint Inj  . Foot Inj  . XR Os Calcis Left   No orders of the defined types were placed in this encounter.     Procedures: Foot Inj Date/Time: 01/11/2018 8:39 AM Performed by: Aundra Dubin, PA-C Authorized by: Aundra Dubin, PA-C   Condition: Plantar Fasciitis   Location: left plantar fascia muscle   Medications:  0.33 mL lidocaine 1 %; 0.33 mL bupivacaine 0.25 %; 13.33 mg methylPREDNISolone acetate 40 MG/ML     Clinical Data: No additional findings.   Subjective: Chief Complaint  Patient presents with  . Left Foot - Pain    HEEL PAIN FOR 1 MONTH.     HPI patient is a pleasant 67 year old female who presents to our clinic today with left heel pain.  This began approximately 1 month ago without any known injury or change in activity.  She did not change walking surfaces, activity or shoes.  All of her pain is to the bottom of the heel.  Pain is worse when she first gets up in the morning for the first few steps as well as when she walks around barefoot and at the end of the day.  She has tried Tylenol without  relief of symptoms.  No numbness, tingling or burning.  No stretching or orthotics.  Of note, she does have a history of plantar fasciitis to the right heel few years back which was injected with cortisone by Dr. Lorin Mercy.  This did significantly help.  She is wondering if she can get an injection today to the left heel.  Review of Systems as detailed in HPI.  All others reviewed and are negative.   Objective: Vital Signs: There were no vitals taken for this visit.  Physical Exam well-developed and well-nourished female in no acute distress.  Alert and oriented x3.  Ortho Exam examination of the left heel shows marked tenderness to the plantar fascial insertion.  Dorsiflexion to neutral.  No retrocalcaneal pain or pain at the Achilles bursa.  No tenderness along the Achilles tendon.  She is neurovascularly intact distally.  Specialty Comments:  No specialty comments available.  Imaging: Xr Os Calcis Left  Result Date: 01/11/2018 X-rays show a retrocalcaneal spur as well as a plantar fascial attachment spur.    PMFS History: Patient Active Problem List   Diagnosis Date Noted  . Pain of left heel 01/11/2018  . Sleep  difficulties 04/15/2016  . Prediabetes 05/07/2015  . Memory difficulties 05/07/2015  . Bilateral leg edema 05/07/2015  . Anemia 10/12/2013  . FH: CAD (coronary artery disease) 04/27/2012  . OSTEOARTHRITIS 03/30/2008  . Hyperlipidemia 07/22/2007  . OBESITY NOS 02/22/2007  . Allergic rhinitis 02/22/2007  . Essential hypertension 02/18/2007   Past Medical History:  Diagnosis Date  . FH: CAD (coronary artery disease)   . High cholesterol   . Hypertension   . Obesity     Family History  Problem Relation Age of Onset  . CAD Sister   . CAD Father   . Kidney disease Sister   . Lung cancer Mother   . Brain cancer Mother     Past Surgical History:  Procedure Laterality Date  . CARPAL TUNNEL RELEASE     bilateral  . HIP SURGERY     Social History   Occupational  History  . Occupation: Psychologist, sport and exercise for [employer]    Comment: Laid Off  Tobacco Use  . Smoking status: Never Smoker  . Smokeless tobacco: Never Used  Substance and Sexual Activity  . Alcohol use: No  . Drug use: No  . Sexual activity: Not on file

## 2018-01-15 DIAGNOSIS — H04213 Epiphora due to excess lacrimation, bilateral lacrimal glands: Secondary | ICD-10-CM | POA: Diagnosis not present

## 2018-01-15 DIAGNOSIS — H35033 Hypertensive retinopathy, bilateral: Secondary | ICD-10-CM | POA: Diagnosis not present

## 2018-01-15 DIAGNOSIS — H04322 Acute dacryocystitis of left lacrimal passage: Secondary | ICD-10-CM | POA: Diagnosis not present

## 2018-01-15 DIAGNOSIS — H2513 Age-related nuclear cataract, bilateral: Secondary | ICD-10-CM | POA: Diagnosis not present

## 2018-01-15 LAB — HM DIABETES EYE EXAM

## 2018-01-19 ENCOUNTER — Encounter: Payer: Self-pay | Admitting: Internal Medicine

## 2018-02-22 DIAGNOSIS — H04211 Epiphora due to excess lacrimation, right lacrimal gland: Secondary | ICD-10-CM | POA: Diagnosis not present

## 2018-02-22 DIAGNOSIS — H04212 Epiphora due to excess lacrimation, left lacrimal gland: Secondary | ICD-10-CM | POA: Diagnosis not present

## 2018-02-22 DIAGNOSIS — H04321 Acute dacryocystitis of right lacrimal passage: Secondary | ICD-10-CM | POA: Diagnosis not present

## 2018-02-22 DIAGNOSIS — H04221 Epiphora due to insufficient drainage, right lacrimal gland: Secondary | ICD-10-CM | POA: Diagnosis not present

## 2018-02-22 DIAGNOSIS — H04213 Epiphora due to excess lacrimation, bilateral lacrimal glands: Secondary | ICD-10-CM | POA: Diagnosis not present

## 2018-02-22 DIAGNOSIS — H04322 Acute dacryocystitis of left lacrimal passage: Secondary | ICD-10-CM | POA: Diagnosis not present

## 2018-03-05 DIAGNOSIS — H04332 Acute lacrimal canaliculitis of left lacrimal passage: Secondary | ICD-10-CM | POA: Diagnosis not present

## 2018-03-05 DIAGNOSIS — H04213 Epiphora due to excess lacrimation, bilateral lacrimal glands: Secondary | ICD-10-CM | POA: Diagnosis not present

## 2018-03-05 DIAGNOSIS — H04322 Acute dacryocystitis of left lacrimal passage: Secondary | ICD-10-CM | POA: Diagnosis not present

## 2018-03-09 DIAGNOSIS — H04332 Acute lacrimal canaliculitis of left lacrimal passage: Secondary | ICD-10-CM | POA: Diagnosis not present

## 2018-03-09 DIAGNOSIS — H04322 Acute dacryocystitis of left lacrimal passage: Secondary | ICD-10-CM | POA: Diagnosis not present

## 2018-03-09 DIAGNOSIS — H04213 Epiphora due to excess lacrimation, bilateral lacrimal glands: Secondary | ICD-10-CM | POA: Diagnosis not present

## 2018-03-11 ENCOUNTER — Other Ambulatory Visit: Payer: Self-pay | Admitting: Internal Medicine

## 2018-03-11 DIAGNOSIS — Z1231 Encounter for screening mammogram for malignant neoplasm of breast: Secondary | ICD-10-CM

## 2018-04-05 ENCOUNTER — Encounter: Payer: Self-pay | Admitting: Gastroenterology

## 2018-04-12 DIAGNOSIS — H04332 Acute lacrimal canaliculitis of left lacrimal passage: Secondary | ICD-10-CM | POA: Diagnosis not present

## 2018-04-12 DIAGNOSIS — H04322 Acute dacryocystitis of left lacrimal passage: Secondary | ICD-10-CM | POA: Diagnosis not present

## 2018-04-12 DIAGNOSIS — H04213 Epiphora due to excess lacrimation, bilateral lacrimal glands: Secondary | ICD-10-CM | POA: Diagnosis not present

## 2018-04-13 ENCOUNTER — Ambulatory Visit
Admission: RE | Admit: 2018-04-13 | Discharge: 2018-04-13 | Disposition: A | Discharge status: Home / Self Care | Payer: Medicare Other | Source: Ambulatory Visit | Attending: Internal Medicine | Admitting: Internal Medicine

## 2018-04-13 DIAGNOSIS — Z1231 Encounter for screening mammogram for malignant neoplasm of breast: Secondary | ICD-10-CM | POA: Diagnosis not present

## 2018-04-17 ENCOUNTER — Other Ambulatory Visit: Payer: Self-pay | Admitting: Internal Medicine

## 2018-04-19 NOTE — Patient Instructions (Addendum)
Tests ordered today. Your results will be released to [place] (or called to you) after review, usually within 72hours after test completion. If any changes need to be made, you will be notified at that same time.  All other Health Maintenance issues reviewed.   All recommended immunizations and age-appropriate screenings are up-to-date or discussed.  Pneumonia and flu immunizations administered today.   Medications reviewed and updated.  Changes include :   none  Your prescription(s) have been submitted to your pharmacy. Please take as directed and contact our office if you believe you are having problem(s) with the medication(s).   Please followup in 6 months   Health Maintenance, Female Adopting a healthy lifestyle and getting preventive care can go a long way to promote health and wellness. Talk with your health care provider about what schedule of regular examinations is right for you. This is a good chance for you to check in with your provider about disease prevention and staying healthy. In between checkups, there are plenty of things you can do on your own. Experts have done a lot of research about which lifestyle changes and preventive measures are most likely to keep you healthy. Ask your health care provider for more information. Weight and diet Eat a healthy diet  Be sure to include plenty of vegetables, fruits, low-fat dairy products, and lean protein.  Do not eat a lot of foods high in solid fats, added sugars, or salt.  Get regular exercise. This is one of the most important things you can do for your health. ? Most adults should exercise for at least 150 minutes each week. The exercise should increase your heart rate and make you sweat (moderate-intensity exercise). ? Most adults should also do strengthening exercises at least twice a week. This is in addition to the moderate-intensity exercise.  Maintain a healthy weight  Body mass index (BMI) is a measurement that  can be used to identify possible weight problems. It estimates body fat based on height and weight. Your health care provider can help determine your BMI and help you achieve or maintain a healthy weight.  For females 48 years of age and older: ? A BMI below 18.5 is considered underweight. ? A BMI of 18.5 to 24.9 is normal. ? A BMI of 25 to 29.9 is considered overweight. ? A BMI of 30 and above is considered obese.  Watch levels of cholesterol and blood lipids  You should start having your blood tested for lipids and cholesterol at 69 years of age, then have this test every 5 years.  You may need to have your cholesterol levels checked more often if: ? Your lipid or cholesterol levels are high. ? You are older than 69 years of age. ? You are at high risk for heart disease.  Cancer screening Lung Cancer  Lung cancer screening is recommended for adults 65-18 years old who are at high risk for lung cancer because of a history of smoking.  A yearly low-dose CT scan of the lungs is recommended for people who: ? Currently smoke. ? Have quit within the past 15 years. ? Have at least a 30-pack-year history of smoking. A pack year is smoking an average of one pack of cigarettes a day for 1 year.  Yearly screening should continue until it has been 15 years since you quit.  Yearly screening should stop if you develop a health problem that would prevent you from having lung cancer treatment.  Breast Cancer  Practice  means understanding how your breasts normally appear and feel.  It also means doing regular breast self-exams. Let your health care provider know about any changes, no matter how small.  If you are in your 20s or 30s, you should have a clinical breast exam (CBE) by a health care provider every 1-3 years as part of a regular health exam.  If you are 40 or older, have a CBE every year. Also consider having a breast X-ray (mammogram) every year.  If you  have a family history of breast cancer, talk to your health care provider about genetic screening.  If you are at high risk for breast cancer, talk to your health care provider about having an MRI and a mammogram every year.  Breast cancer gene (BRCA) assessment is recommended for women who have family members with BRCA-related cancers. BRCA-related cancers include: ? Breast. ? Ovarian. ? Tubal. ? Peritoneal cancers.  Results of the assessment will determine the need for genetic counseling and BRCA1 and BRCA2 testing.  Cervical Cancer Your health care provider may recommend that you be screened regularly for cancer of the pelvic organs (ovaries, uterus, and vagina). This screening involves a pelvic examination, including checking for microscopic changes to the surface of your cervix (Pap test). You may be encouraged to have this screening done every 3 years, beginning at age 21.  For women ages 30-65, health care providers may recommend pelvic exams and Pap testing every 3 years, or they may recommend the Pap and pelvic exam, combined with testing for human papilloma virus (HPV), every 5 years. Some types of HPV increase your risk of cervical cancer. Testing for HPV may also be done on women of any age with unclear Pap test results.  Other health care providers may not recommend any screening for nonpregnant women who are considered low risk for pelvic cancer and who do not have symptoms. Ask your health care provider if a screening pelvic exam is right for you.  If you have had past treatment for cervical cancer or a condition that could lead to cancer, you need Pap tests and screening for cancer for at least 20 years after your treatment. If Pap tests have been discontinued, your risk factors (such as having a new sexual partner) need to be reassessed to determine if screening should resume. Some women have medical problems that increase the chance of getting cervical cancer. In these cases,  your health care provider may recommend more frequent screening and Pap tests.  Colorectal Cancer  This type of cancer can be detected and often prevented.  Routine colorectal cancer screening usually begins at 69 years of age and continues through 69 years of age.  Your health care provider may recommend screening at an earlier age if you have risk factors for colon cancer.  Your health care provider may also recommend using home test kits to check for hidden blood in the stool.  A small camera at the end of a tube can be used to examine your colon directly (sigmoidoscopy or colonoscopy). This is done to check for the earliest forms of colorectal cancer.  Routine screening usually begins at age 50.  Direct examination of the colon should be repeated every 5-10 years through 69 years of age. However, you may need to be screened more often if early forms of precancerous polyps or small growths are found.  Skin Cancer  Check your skin from head to toe regularly.  Tell your health care provider about any new   moles or changes in moles, especially if there is a change in a mole's shape or color.  Also tell your health care provider if you have a mole that is larger than the size of a pencil eraser.  Always use sunscreen. Apply sunscreen liberally and repeatedly throughout the day.  Protect yourself by wearing long sleeves, pants, a wide-brimmed hat, and sunglasses whenever you are outside.  Heart disease, diabetes, and high blood pressure  High blood pressure causes heart disease and increases the risk of stroke. High blood pressure is more likely to develop in: ? People who have blood pressure in the high end of the normal range (130-139/85-89 mm Hg). ? People who are overweight or obese. ? People who are African American.  If you are 18-39 years of age, have your blood pressure checked every 3-5 years. If you are 40 years of age or older, have your blood pressure checked every year.  You should have your blood pressure measured twice-once when you are at a hospital or clinic, and once when you are not at a hospital or clinic. Record the average of the two measurements. To check your blood pressure when you are not at a hospital or clinic, you can use: ? An automated blood pressure machine at a pharmacy. ? A home blood pressure monitor.  If you are between 55 years and 79 years old, ask your health care provider if you should take aspirin to prevent strokes.  Have regular diabetes screenings. This involves taking a blood sample to check your fasting blood sugar level. ? If you are at a normal weight and have a low risk for diabetes, have this test once every three years after 69 years of age. ? If you are overweight and have a high risk for diabetes, consider being tested at a younger age or more often. Preventing infection Hepatitis B  If you have a higher risk for hepatitis B, you should be screened for this virus. You are considered at high risk for hepatitis B if: ? You were born in a country where hepatitis B is common. Ask your health care provider which countries are considered high risk. ? Your parents were born in a high-risk country, and you have not been immunized against hepatitis B (hepatitis B vaccine). ? You have HIV or AIDS. ? You use needles to inject street drugs. ? You live with someone who has hepatitis B. ? You have had sex with someone who has hepatitis B. ? You get hemodialysis treatment. ? You take certain medicines for conditions, including cancer, organ transplantation, and autoimmune conditions.  Hepatitis C  Blood testing is recommended for: ? Everyone born from 1945 through 1965. ? Anyone with known risk factors for hepatitis C.  Sexually transmitted infections (STIs)  You should be screened for sexually transmitted infections (STIs) including gonorrhea and chlamydia if: ? You are sexually active and are younger than 69 years of  age. ? You are older than 69 years of age and your health care provider tells you that you are at risk for this type of infection. ? Your sexual activity has changed since you were last screened and you are at an increased risk for chlamydia or gonorrhea. Ask your health care provider if you are at risk.  If you do not have HIV, but are at risk, it may be recommended that you take a prescription medicine daily to prevent HIV infection. This is called pre-exposure prophylaxis (PrEP). You are considered at risk   if: ? You are sexually active and do not regularly use condoms or know the HIV status of your partner(s). ? You take drugs by injection. ? You are sexually active with a partner who has HIV.  Talk with your health care provider about whether you are at high risk of being infected with HIV. If you choose to begin PrEP, you should first be tested for HIV. You should then be tested every 3 months for as long as you are taking PrEP. Pregnancy  If you are premenopausal and you may become pregnant, ask your health care provider about preconception counseling.  If you may become pregnant, take 400 to 800 micrograms (mcg) of folic acid every day.  If you want to prevent pregnancy, talk to your health care provider about birth control (contraception). Osteoporosis and menopause  Osteoporosis is a disease in which the bones lose minerals and strength with aging. This can result in serious bone fractures. Your risk for osteoporosis can be identified using a bone density scan.  If you are 65 years of age or older, or if you are at risk for osteoporosis and fractures, ask your health care provider if you should be screened.  Ask your health care provider whether you should take a calcium or vitamin D supplement to lower your risk for osteoporosis.  Menopause may have certain physical symptoms and risks.  Hormone replacement therapy may reduce some of these symptoms and risks. Talk to your health  care provider about whether hormone replacement therapy is right for you. Follow these instructions at home:  Schedule regular health, dental, and eye exams.  Stay current with your immunizations.  Do not use any tobacco products including cigarettes, chewing tobacco, or electronic cigarettes.  If you are pregnant, do not drink alcohol.  If you are breastfeeding, limit how much and how often you drink alcohol.  Limit alcohol intake to no more than 1 drink per day for nonpregnant women. One drink equals 12 ounces of beer, 5 ounces of wine, or 1 ounces of hard liquor.  Do not use street drugs.  Do not share needles.  Ask your health care provider for help if you need support or information about quitting drugs.  Tell your health care provider if you often feel depressed.  Tell your health care provider if you have ever been abused or do not feel safe at home. This information is not intended to replace advice given to you by your health care provider. Make sure you discuss any questions you have with your health care provider. Document Released: 12/09/2010 Document Revised: 11/01/2015 Document Reviewed: 02/27/2015 Elsevier Interactive Patient Education  2018 Elsevier Inc.  

## 2018-04-19 NOTE — Progress Notes (Signed)
Subjective:    Patient ID: Brooke [name], female    DOB: Nov 05, 1950, 67 y.o.   MRN: [id number]  HPI She is here for a physical exam.   Memory difficulties:  She thinks her memory is worse over the past 6 months.  She puts something someplace and forgets where it is.  The other day she forget where a place was that she knows where it is.  She was driving her husband told her which way to turn in when she did that she knew where the place was.  She writes a list, but forgets the list.    Her husbands health is not good.  He is going to need to start dialysis soon.  She is needing to do more at home.  She often watches her great-granddaughter a couple of evenings a week.  She does admit to increased stress, but denies depression.  She is not currently exercising.    Medications and allergies reviewed with patient and updated if appropriate.  Patient Active Problem List   Diagnosis Date Noted  . Pain of left heel 01/11/2018  . Sleep difficulties 04/15/2016  . Prediabetes 05/07/2015  . Memory difficulties 05/07/2015  . Bilateral leg edema 05/07/2015  . Anemia 10/12/2013  . FH: CAD (coronary artery disease) 04/27/2012  . OSTEOARTHRITIS 03/30/2008  . Hyperlipidemia 07/22/2007  . OBESITY NOS 02/22/2007  . Allergic rhinitis 02/22/2007  . Essential hypertension 02/18/2007    Current Outpatient Medications on File Prior to Visit  Medication Sig Dispense Refill  . amLODipine (NORVASC) 10 MG tablet Take 1 tablet (10 mg total) daily by mouth. 90 tablet 3  . aspirin 81 MG chewable tablet Chew 81 mg by mouth daily.    Marland Kitchen atenolol (TENORMIN) 25 MG tablet Take 1 tablet (25 mg total) daily by mouth. 90 tablet 3  . atorvastatin (LIPITOR) 20 MG tablet TAKE 1 TABLET BY MOUTH ONCE DAILY 90 tablet 3  . [facility] BLACK COHOSH PO Take 1 tablet by mouth.    . ferrous sulfate 325 (65 FE) MG tablet Take 324 mg by mouth daily with breakfast.    . ibuprofen (ADVIL,MOTRIN) 200 MG tablet Take 400 mg by mouth  every 6 (six) hours as needed. For pain    . potassium chloride (KLOR-CON M10) 10 MEQ tablet Take 1 tablet (10 mEq total) 2 (two) times daily by mouth. 180 tablet 3  . traZODone (DESYREL) 50 MG tablet Take 0.5-1 tablets (25-50 mg total) at bedtime by mouth. 90 tablet 3  . triamterene-hydrochlorothiazide (MAXZIDE-25) 37.5-25 MG tablet Take 1 tablet daily by mouth. 90 tablet 3   No current facility-administered medications on file prior to visit.     Past Medical History:  Diagnosis Date  . FH: CAD (coronary artery disease)   . High cholesterol   . Hypertension   . Obesity     Past Surgical History:  Procedure Laterality Date  . CARPAL TUNNEL RELEASE     bilateral  . HIP SURGERY      Social History   Socioeconomic History  . Marital status: Married    Spouse name: Not on file  . Number of children: 2  . Years of education: 12th   . Highest education level: Not on file  Occupational History  . 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 Financial resource strain: Not on file  . Food insecurity:    Worry: Not on file  Inability: Not on file  . Transportation needs:    Medical: Not on file    Non-medical: Not on file  Tobacco Use  . Smoking status: Never Smoker  . Smokeless tobacco: Never Used  Substance and Sexual Activity  . Alcohol use: No  . Drug use: No  . Sexual activity: Not on file  Lifestyle  . Physical activity:    Days per week: Not on file    Minutes per session: Not on file  . Stress: Not on file  Relationships  . Social connections:    Talks on phone: Not on file    Gets together: Not on file    Attends religious service: Not on file    Active member of club or organization: Not on file    Attends meetings of clubs or organizations: Not on file    Relationship status: Not on file  Other Topics Concern  . Not on file  Social History Narrative   Pt was recently laid off from working in Designer, fashion/clothing.         Family History  Problem Relation Age of Onset  . CAD Sister   . CAD Father   . Kidney disease Sister   . Lung cancer Mother   . Brain cancer Mother     Review of Systems  Constitutional: Negative for chills and fever.  Eyes: Negative for visual disturbance.  Respiratory: Negative for cough, shortness of breath and wheezing.   Cardiovascular: Negative for chest pain, palpitations and leg swelling.  Gastrointestinal: Negative for abdominal pain, blood in stool, constipation, diarrhea and nausea.       No gerd  Genitourinary: Negative for dysuria and hematuria.  Musculoskeletal: Negative for arthralgias and back pain.  Skin: Negative for color change and rash.  Neurological: Negative for light-headedness and headaches.  Psychiatric/Behavioral: Negative for dysphoric mood. The patient is not nervous/anxious.        Objective:   Vitals:   04/21/18 1054  BP: 128/66  Pulse: (!) 57  Resp: 16  Temp: 98.6 F (37 C)  SpO2: 97%   Filed Weights   04/21/18 1054  Weight: 184 lb 12.8 oz (83.8 kg)   Body mass index is 34.92 kg/m.  BP Readings from Last 3 Encounters:  04/21/18 128/66  08/27/17 136/78  05/01/17 130/70    Wt Readings from Last 3 Encounters:  04/21/18 184 lb 12.8 oz (83.8 kg)  08/27/17 187 lb (84.8 kg)  05/01/17 189 lb (85.7 kg)     Physical Exam Constitutional: She appears well-developed and well-nourished. No distress.  HENT:  Head: Normocephalic and atraumatic.  Right Ear: External ear normal. Normal ear canal and TM Left Ear: External ear normal.  Normal ear canal and TM Mouth/Throat: Oropharynx is clear and moist.  Eyes: Conjunctivae and EOM are normal.  Neck: Neck supple. No tracheal deviation present. No thyromegaly present.  No carotid bruit  Cardiovascular: Normal rate, regular rhythm and normal heart sounds.   No murmur heard.  No edema. Pulmonary/Chest: Effort normal and breath sounds normal. No respiratory  distress. She has no wheezes. She has no rales.  Breast: deferred  Abdominal: Soft. She exhibits no distension. There is no tenderness.  Lymphadenopathy: She has no cervical adenopathy.  Skin: Skin is warm and dry. She is not diaphoretic.  Psychiatric: She has a normal mood and affect. Her behavior is normal.        Assessment & Plan:   Physical exam: Screening blood work  ordered Immunizations    prevnar today , flu vaccine today , shingrix and tdap discussed Colonoscopy   Due -printed out letter from GI-advised her to call the number and schedule Mammogram   Up to date  Gyn    - no longer seeing Dexa   Up to date  Eye exams   Up to date  - dr weaver at [facility] EKG   Done 2015 Exercise  Not regularly Weight  Advised weight loss Skin no concerns Substance abuse   none  See Problem List for Assessment and Plan of chronic medical problems.   Follow-up in 6 months

## 2018-04-21 ENCOUNTER — Encounter: Payer: Self-pay | Admitting: Internal Medicine

## 2018-04-21 ENCOUNTER — Ambulatory Visit (INDEPENDENT_AMBULATORY_CARE_PROVIDER_SITE_OTHER): Payer: Medicare Other | Admitting: Internal Medicine

## 2018-04-21 ENCOUNTER — Other Ambulatory Visit (INDEPENDENT_AMBULATORY_CARE_PROVIDER_SITE_OTHER): Payer: Medicare Other

## 2018-04-21 VITALS — BP 128/66 | HR 57 | Temp 98.6°F | Resp 16 | Ht 61.0 in | Wt 184.8 lb

## 2018-04-21 DIAGNOSIS — R413 Other amnesia: Secondary | ICD-10-CM

## 2018-04-21 DIAGNOSIS — E785 Hyperlipidemia, unspecified: Secondary | ICD-10-CM

## 2018-04-21 DIAGNOSIS — R600 Localized edema: Secondary | ICD-10-CM

## 2018-04-21 DIAGNOSIS — R7303 Prediabetes: Secondary | ICD-10-CM

## 2018-04-21 DIAGNOSIS — D649 Anemia, unspecified: Secondary | ICD-10-CM | POA: Diagnosis not present

## 2018-04-21 DIAGNOSIS — Z23 Encounter for immunization: Secondary | ICD-10-CM

## 2018-04-21 DIAGNOSIS — Z0000 Encounter for general adult medical examination without abnormal findings: Secondary | ICD-10-CM

## 2018-04-21 DIAGNOSIS — I10 Essential (primary) hypertension: Secondary | ICD-10-CM

## 2018-04-21 DIAGNOSIS — G479 Sleep disorder, unspecified: Secondary | ICD-10-CM

## 2018-04-21 LAB — COMPREHENSIVE METABOLIC PANEL
ALK PHOS: 96 U/L (ref 39–117)
ALT: 19 U/L (ref 0–35)
AST: 21 U/L (ref 0–37)
Albumin: 4 g/dL (ref 3.5–5.2)
BILIRUBIN TOTAL: 0.6 mg/dL (ref 0.2–1.2)
BUN: 21 mg/dL (ref 6–23)
CO2: 30 mEq/L (ref 19–32)
CREATININE: 1.14 mg/dL (ref 0.40–1.20)
Calcium: 9.9 mg/dL (ref 8.4–10.5)
Chloride: 104 mEq/L (ref 96–112)
GFR: 60.78 mL/min (ref >60.00)
GLUCOSE: 113 mg/dL — AB (ref 70–99)
Potassium: 3.4 mEq/L — ABNORMAL LOW (ref 3.5–5.1)
SODIUM: 141 meq/L (ref 135–145)
TOTAL PROTEIN: 7.5 g/dL (ref 6.0–8.3)

## 2018-04-21 LAB — CBC WITH DIFFERENTIAL/PLATELET
Basophils Absolute: 0 10*3/uL (ref 0.0–0.1)
Basophils Relative: 0.3 % (ref 0.0–3.0)
EOS ABS: 0.3 10*3/uL (ref 0.0–0.7)
Eosinophils Relative: 3.6 % (ref 0.0–5.0)
HCT: 37.1 % (ref 36.0–46.0)
Hemoglobin: 11.8 g/dL — ABNORMAL LOW (ref 12.0–15.0)
LYMPHS ABS: 1.7 10*3/uL (ref 0.7–4.0)
Lymphocytes Relative: 23.5 % (ref 12.0–46.0)
MCHC: 31.9 g/dL (ref 30.0–36.0)
MCV: 72.9 fl — ABNORMAL LOW (ref 78.0–100.0)
MONO ABS: 0.7 10*3/uL (ref 0.1–1.0)
Monocytes Relative: 9.1 % (ref 3.0–12.0)
NEUTROS PCT: 63.5 % (ref 43.0–77.0)
Neutro Abs: 4.7 10*3/uL (ref 1.4–7.7)
Platelets: 195 10*3/uL (ref 150.0–400.0)
RBC: 5.09 Mil/uL (ref 3.87–5.11)
RDW: 16.3 % — ABNORMAL HIGH (ref 11.5–15.5)
WBC: 7.4 10*3/uL (ref 4.0–10.5)

## 2018-04-21 LAB — LIPID PANEL
CHOLESTEROL: 142 mg/dL (ref 0–200)
HDL: 60.9 mg/dL (ref >39.00)
LDL Cholesterol: 62 mg/dL (ref 0–99)
NonHDL: 81.06
Total CHOL/HDL Ratio: 2
Triglycerides: 96 mg/dL (ref 0.0–149.0)
VLDL: 19.2 mg/dL (ref 0.0–40.0)

## 2018-04-21 LAB — TSH: TSH: 1.52 u[IU]/mL (ref 0.35–4.50)

## 2018-04-21 LAB — HEMOGLOBIN A1C: Hgb A1c MFr Bld: 6.2 % (ref 4.6–6.5)

## 2018-04-21 MED ORDER — AMLODIPINE BESYLATE 10 MG PO TABS: 10.0000 mg | ORAL_TABLET | Freq: Every day | ORAL | 3 refills | Status: DC

## 2018-04-21 MED ORDER — ATENOLOL 25 MG PO TABS: 25.0000 mg | ORAL_TABLET | Freq: Every day | ORAL | 3 refills | Status: DC

## 2018-04-21 MED ORDER — POTASSIUM CHLORIDE CRYS ER 10 MEQ PO TBCR: 10.0000 meq | EXTENDED_RELEASE_TABLET | Freq: Two times a day (BID) | ORAL | 3 refills | Status: DC

## 2018-04-21 MED ORDER — TRIAMTERENE-HCTZ 37.5-25 MG PO TABS: 1.0000 | ORAL_TABLET | Freq: Every day | ORAL | 3 refills | Status: DC

## 2018-04-21 NOTE — Addendum Note (Signed)
Addended by: Delice Bison E on: 04/21/2018 01:16 PM   Modules accepted: Orders

## 2018-04-21 NOTE — Assessment & Plan Note (Signed)
Sleep is good No longer taking trazodone

## 2018-04-21 NOTE — Assessment & Plan Note (Signed)
Minimal edema at times Overall controlled Continue current medication CMP

## 2018-04-21 NOTE — Assessment & Plan Note (Signed)
She has some mild memory difficulties-some of which are likely normal Some of her memory issues may be related to increased stress Discussed prevention of worsening of her memory including brain exercises, physical exercise and stress reduction.  Discussed healthy diet low in trans-fat Discussed neurology referral for further evaluation at this point she would like to hold off, but will let me know if she changes her mind May try Prevagen

## 2018-04-21 NOTE — Assessment & Plan Note (Signed)
BP well controlled Current regimen effective and well tolerated Continue current medications at current doses cmp  

## 2018-04-21 NOTE — Assessment & Plan Note (Signed)
Check a1c Low sugar / carb diet Stressed regular exercise   

## 2018-04-21 NOTE — Assessment & Plan Note (Signed)
Check lipid panel  Continue daily statin Regular exercise and healthy diet encouraged  

## 2018-04-21 NOTE — Assessment & Plan Note (Addendum)
cbc  Taking iron daily

## 2018-05-04 ENCOUNTER — Encounter: Payer: Self-pay | Admitting: Internal Medicine

## 2018-05-04 ENCOUNTER — Ambulatory Visit (INDEPENDENT_AMBULATORY_CARE_PROVIDER_SITE_OTHER): Payer: Medicare Other | Admitting: Internal Medicine

## 2018-05-04 VITALS — BP 122/66 | HR 64 | Temp 98.6°F | Resp 18 | Ht 61.0 in | Wt 183.4 lb

## 2018-05-04 DIAGNOSIS — J069 Acute upper respiratory infection, unspecified: Secondary | ICD-10-CM | POA: Insufficient documentation

## 2018-05-04 NOTE — Assessment & Plan Note (Signed)
Symptoms likely viral in nature Continue symptomatic treatment with over-the-counter cold medications, Tylenol/ibuprofen Increase rest and fluids Call if symptoms worsen or do not improve 

## 2018-05-04 NOTE — Patient Instructions (Signed)
Your infection is likely viral in nature.  No antibiotic is needed.    If your symptoms worsen or fail to improve, please contact our office for further instruction, or in case of emergency go directly to the emergency room at the closest medical facility.   General Recommendations:    Please drink plenty of fluids.  Get plenty of rest   Sleep in humidified air  Use saline nasal sprays  Netti pot  OTC Medications:  Decongestants - helps relieve congestion   Flonase (generic fluticasone) or Nasacort (generic triamcinolone) - please make sure to use the "cross-over" technique at a 45 degree angle towards the opposite eye as opposed to straight up the nasal passageway.   Sudafed (generic pseudoephedrine - Note this is the one that is available behind the pharmacy counter); Products with phenylephrine (-PE) may also be used but is often not as effective as pseudoephedrine.   If you have HIGH BLOOD PRESSURE - Coricidin HBP; AVOID any product that is -D as this contains pseudoephedrine which may increase your blood pressure.  Afrin (oxymetazoline) every 6-8 hours for up to 3 days.  Allergies - helps relieve runny nose, itchy eyes and sneezing   Claritin (generic loratidine), Allegra (fexofenidine), or Zyrtec (generic cyrterizine) for runny nose. These medications should not cause drowsiness.  Note - Benadryl (generic diphenhydramine) may be used however may cause drowsiness  Cough -   Delsym or Robitussin (generic dextromethorphan)  Expectorants - helps loosen mucus to ease removal   Mucinex (generic guaifenesin) as directed on the package.  Headaches / General Aches   Tylenol (generic acetaminophen) - DO NOT EXCEED 3 grams (3,000 mg) in a 24 hour time period  Advil/Motrin (generic ibuprofen)  Sore Throat -   Salt water gargle   Chloraseptic (generic benzocaine) spray or lozenges / Sucrets (generic dyclonine)

## 2018-05-04 NOTE — Progress Notes (Signed)
Subjective:    Patient ID: Brooke [name], female    DOB: 1950/10/23, 67 y.o.   MRN: [id number]  HPI She is here for an acute visit for cold symptoms.  Her symptoms started 4 days ago  She is experiencing decreased appetite, fatigue, nasal congestion with green mucus, rhinorrhea and a dry cough.  She denies fever, chills, ear pain, sinus pain, sore throat, shortness of breath, wheezing, diarrhea, nausea, body aches, headaches and lightheadedness.  She has taken mucinex, nyquil and dayquil  Medications and allergies reviewed with patient and updated if appropriate.  Patient Active Problem List   Diagnosis Date Noted  . Pain of left heel 01/11/2018  . Sleep difficulties 04/15/2016  . Prediabetes 05/07/2015  . Memory difficulties 05/07/2015  . Bilateral leg edema 05/07/2015  . Anemia 10/12/2013  . FH: CAD (coronary artery disease) 04/27/2012  . OSTEOARTHRITIS 03/30/2008  . Hyperlipidemia 07/22/2007  . OBESITY NOS 02/22/2007  . Allergic rhinitis 02/22/2007  . Essential hypertension 02/18/2007    Current Outpatient Medications on File Prior to Visit  Medication Sig Dispense Refill  . amLODipine (NORVASC) 10 MG tablet Take 1 tablet (10 mg total) by mouth daily. 90 tablet 3  . aspirin 81 MG chewable tablet Chew 81 mg by mouth daily.    Marland Kitchen atenolol (TENORMIN) 25 MG tablet Take 1 tablet (25 mg total) by mouth daily. 90 tablet 3  . atorvastatin (LIPITOR) 20 MG tablet TAKE 1 TABLET BY MOUTH ONCE DAILY 90 tablet 3  . [facility] BL[facility] COHOSH PO Take 1 tablet by mouth.    . ferrous sulfate 325 (65 FE) MG tablet Take 324 mg by mouth daily with breakfast.    . potassium chloride (KLOR-CON M10) 10 MEQ tablet Take 1 tablet (10 mEq total) by mouth 2 (two) times daily. (Patient taking differently: Take 10 mEq by mouth 3 (three) times daily. ) 180 tablet 3  . triamterene-hydrochlorothiazide (MAXZIDE-25) 37.5-25 MG tablet Take 1 tablet by mouth daily. 90 tablet 3   No current facility-administered  medications on file prior to visit.     Past Medical History:  Diagnosis Date  . FH: CAD (coronary artery disease)   . High cholesterol   . Hypertension   . Obesity     Past Surgical History:  Procedure Laterality Date  . CARPAL TUNNEL RELEASE     bilateral  . HIP SURGERY      Social History   Socioeconomic History  . Marital status: Married    Spouse name: Not on file  . Number of children: 2  . Years of education: 12th   . Highest education level: Not on file  Occupational History  . 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 Financial resource strain: Not on file  . Food insecurity:    Worry: Not on file    Inability: Not on file  . Transportation needs:    Medical: Not on file    Non-medical: Not on file  Tobacco Use  . Smoking status: Never Smoker  . Smokeless tobacco: Never Used  Substance and Sexual Activity  . Alcohol use: No  . Drug use: No  . Sexual activity: Not on file  Lifestyle  . Physical activity:    Days per week: Not on file    Minutes per session: Not on file  . Stress: Not on file  Relationships  . Social connections:    Talks on phone:  Not on file    Gets together: Not on file    Attends religious service: Not on file    Active member of club or organization: Not on file    Attends meetings of clubs or organizations: Not on file    Relationship status: Not on file  Other Topics Concern  . 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         Family History  Problem Relation Age of Onset  . CAD Sister   . CAD Father   . Kidney disease Sister   . Lung cancer Mother   . Brain cancer Mother     Review of Systems  Constitutional: Positive for appetite change (dec) and fatigue. Negative for chills and fever.  HENT: Positive for congestion (green mucus) and rhinorrhea. Negative for ear pain, sinus pressure, sinus pain  and sore throat.   Respiratory: Positive for cough (dry). Negative for shortness of breath and wheezing.   Gastrointestinal: Negative for diarrhea and nausea.  Musculoskeletal: Negative for myalgias.  Neurological: Negative for dizziness, light-headedness and headaches.       Objective:   Vitals:   05/04/18 1448  BP: 122/66  Pulse: 64  Resp: 18  Temp: 98.6 F (37 C)  SpO2: 96%   Filed Weights   05/04/18 1448  Weight: 183 lb 6.4 oz (83.2 kg)   Body mass index is 34.65 kg/m.  Wt Readings from Last 3 Encounters:  05/04/18 183 lb 6.4 oz (83.2 kg)  04/21/18 184 lb 12.8 oz (83.8 kg)  08/27/17 187 lb (84.8 kg)     Physical Exam GENERAL APPEARANCE: Appears stated age, well appearing, NAD EYES: conjunctiva clear, no icterus HEENT: bilateral tympanic membranes and ear canals normal, oropharynx with mild erythema, no thyromegaly, trachea midline, no cervical or supraclavicular lymphadenopathy LUNGS: Clear to auscultation without wheeze or crackles, unlabored breathing, good air entry bilaterally CARDIOVASCULAR: Normal S1,S2 without murmurs, no edema SKIN: warm, dry        Assessment & Plan:   See Problem List for Assessment and Plan of chronic medical promblems

## 2018-07-30 IMAGING — DX DG KNEE COMPLETE 4+V*R*
4 series · 4 of 4 positions shown · non-contrast
Comparison: None.

CLINICAL DATA: Fall x 3 days ago. Swelling and general pain since.

EXAM:
RIGHT KNEE - COMPLETE 4+ VIEW

[knee ap]
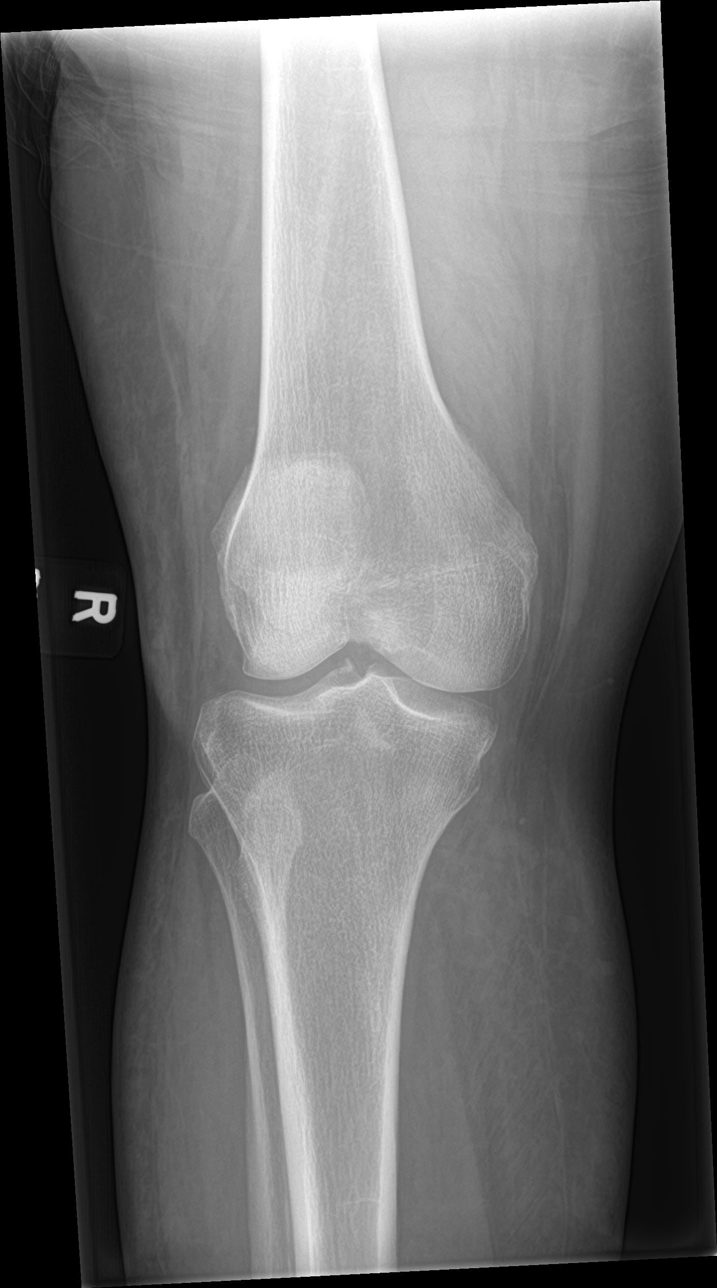

[knee lat]
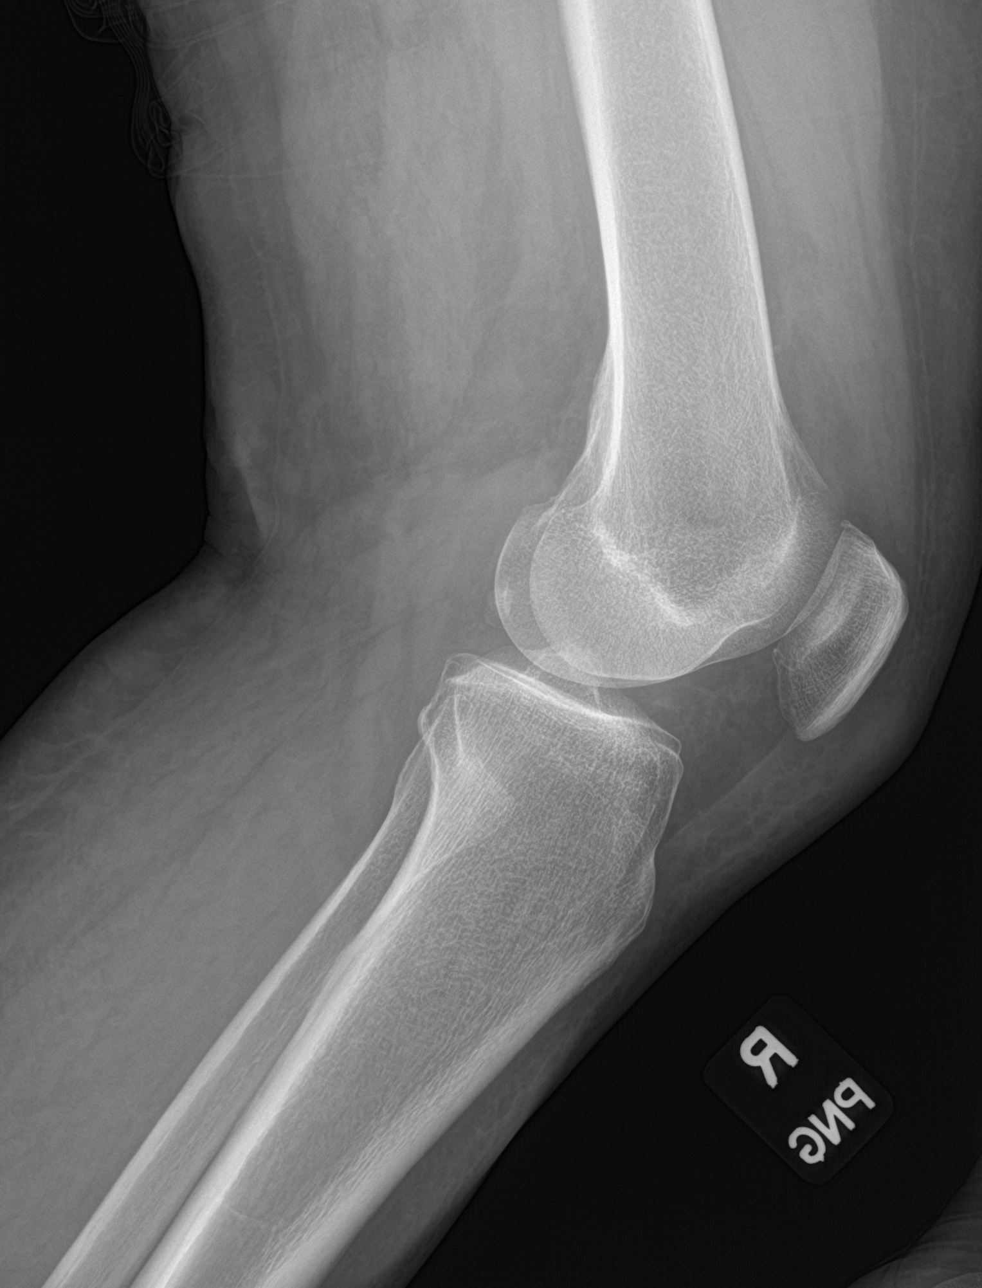

[knee obl (1 of 2)]
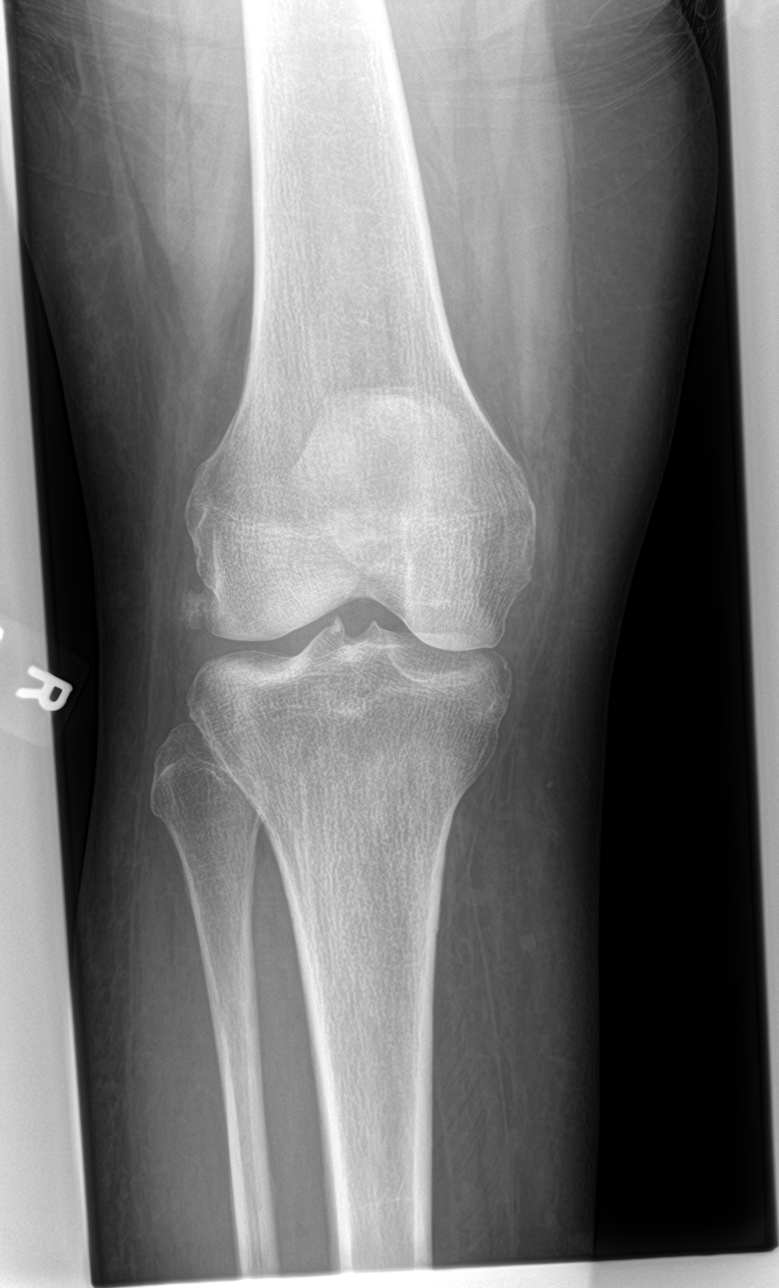

[knee obl (2 of 2)]
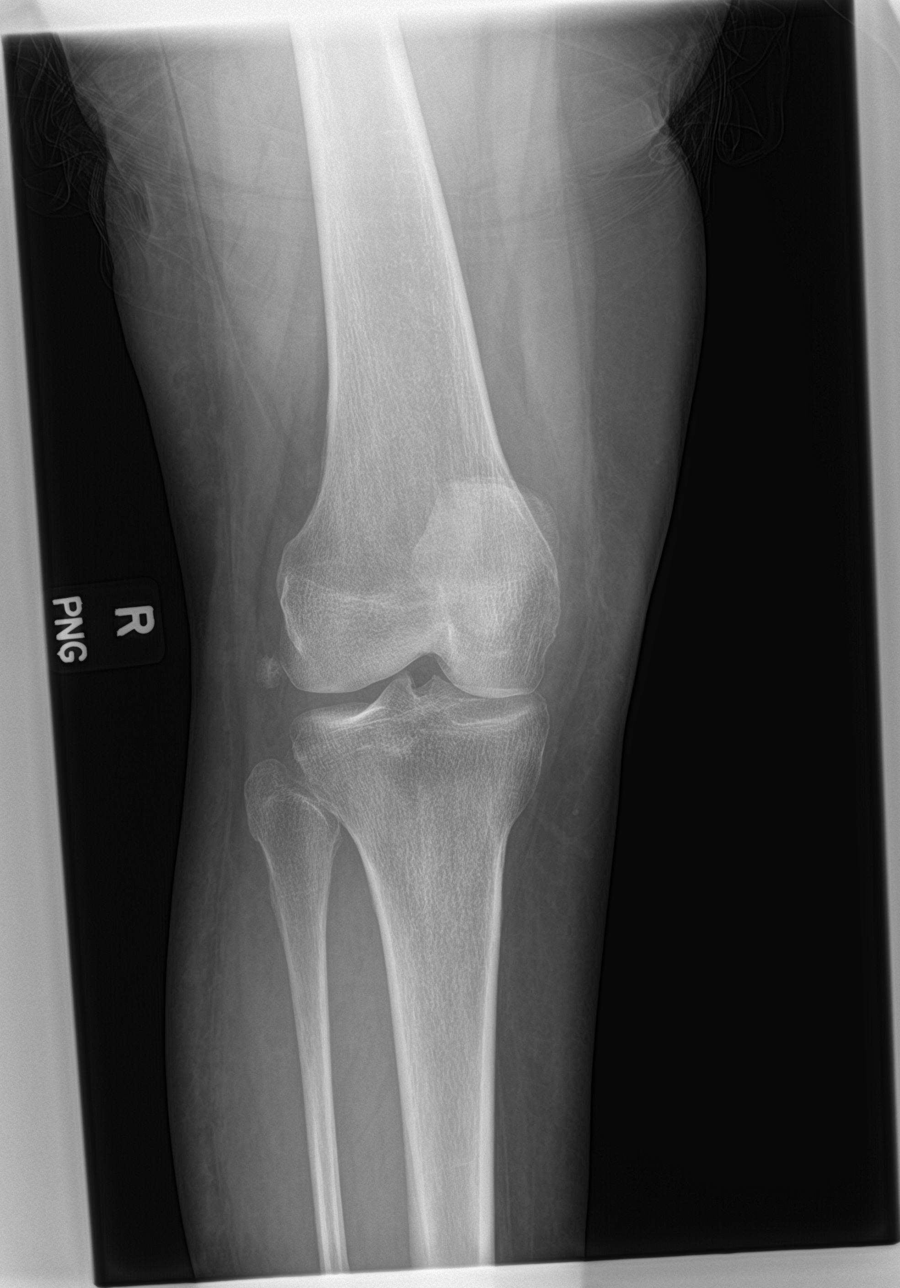

[4 of 4 positions shown; findings below may reference images not displayed]

FINDINGS: Osseous alignment is normal. Bone mineralization is normal. No
fracture line or displaced fracture fragment seen. No appreciable
joint effusion and adjacent soft tissues are unremarkable.

No degenerative change seen. Chronic appearing calcific fragment
along the lateral joint line is likely related to previous injury.
IMPRESSION: No acute findings. No osseous fracture or dislocation. No evidence
of joint effusion.

## 2018-08-23 NOTE — Progress Notes (Signed)
Subjective:    Patient ID: Brooke [name], female    DOB: February 13, 1951, 68 y.o.   MRN: [id number]  HPI The patient is here for an acute visit.   Since December she has been very busy with taking care of her husband and going back and forth to doctors appointment.   He had a stroke in December and was started on dialysis.  At times she feels tightness in her chest.  It started last week.  The pain is in her central chest and does not radiate.  She thinks she has had 4 episodes.  The pain is noticeable, not severe.  She has taken pepcid and it helps.    Last week she did a lot of raking and was carrying leaves and she did not feel it then.  She feels it this morning sitting here.  She did not eat this morning.  She typically does not have any GERD-she last had heartburn years ago. She denies pain with movement or deep breathes.  She is unsure if the pain occurs with just rest, and more both rest and activity.  She states most of the time she does not pay attention.  She does experience palpitations when she is rushing around only.  She denies any shortness of breathor lightheadedness.  Medications and allergies reviewed with patient and updated if appropriate.  Patient Active Problem List   Diagnosis Date Noted  . Chest tightness 08/24/2018  . Viral upper respiratory tract infection 05/04/2018  . Pain of left heel 01/11/2018  . Sleep difficulties 04/15/2016  . Prediabetes 05/07/2015  . Memory difficulties 05/07/2015  . Bilateral leg edema 05/07/2015  . Anemia 10/12/2013  . FH: CAD (coronary artery disease) 04/27/2012  . OSTEOARTHRITIS 03/30/2008  . Hyperlipidemia 07/22/2007  . OBESITY NOS 02/22/2007  . Allergic rhinitis 02/22/2007  . Essential hypertension 02/18/2007    Current Outpatient Medications on File Prior to Visit  Medication Sig Dispense Refill  . amLODipine (NORVASC) 10 MG tablet Take 1 tablet (10 mg total) by mouth daily. 90 tablet 3  . aspirin 81 MG chewable tablet  Chew 81 mg by mouth daily.    Marland Kitchen atenolol (TENORMIN) 25 MG tablet Take 1 tablet (25 mg total) by mouth daily. 90 tablet 3  . atorvastatin (LIPITOR) 20 MG tablet TAKE 1 TABLET BY MOUTH ONCE DAILY 90 tablet 3  . [facility] BL[facility] COHOSH PO Take 1 tablet by mouth.    . ferrous sulfate 325 (65 FE) MG tablet Take 324 mg by mouth daily with breakfast.    . potassium chloride (KLOR-CON M10) 10 MEQ tablet Take 1 tablet (10 mEq total) by mouth 2 (two) times daily. (Patient taking differently: Take 10 mEq by mouth 3 (three) times daily. ) 180 tablet 3  . triamterene-hydrochlorothiazide (MAXZIDE-25) 37.5-25 MG tablet Take 1 tablet by mouth daily. 90 tablet 3   No current facility-administered medications on file prior to visit.     Past Medical History:  Diagnosis Date  . FH: CAD (coronary artery disease)   . High cholesterol   . Hypertension   . Obesity     Past Surgical History:  Procedure Laterality Date  . CARPAL TUNNEL RELEASE     bilateral  . HIP SURGERY      Social History   Socioeconomic History  . Marital status: Married    Spouse name: Not on file  . Number of children: 2  . Years of education: 12th   . Highest education level:  Not on file  Occupational History  . 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 Financial resource strain: Not on file  . Food insecurity:    Worry: Not on file    Inability: Not on file  . Transportation needs:    Medical: Not on file    Non-medical: Not on file  Tobacco Use  . Smoking status: Never Smoker  . Smokeless tobacco: Never Used  Substance and Sexual Activity  . Alcohol use: No  . Drug use: No  . Sexual activity: Not on file  Lifestyle  . Physical activity:    Days per week: Not on file    Minutes per session: Not on file  . Stress: Not on file  Relationships  . Social connections:    Talks on phone: Not on file    Gets together: Not on file    Attends religious service: Not on file     Active member of club or organization: Not on file    Attends meetings of clubs or organizations: Not on file    Relationship status: Not on file  Other Topics Concern  . 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         Family History  Problem Relation Age of Onset  . CAD Sister   . CAD Father   . Kidney disease Sister   . Lung cancer Mother   . Brain cancer Mother     Review of Systems  Constitutional: Negative for chills, fatigue and fever.  Respiratory: Negative for cough, shortness of breath and wheezing.   Cardiovascular: Positive for chest pain and palpitations (when rushing only). Negative for leg swelling.  Gastrointestinal: Negative for abdominal pain and nausea.       No gerd  Neurological: Negative for light-headedness and headaches.       Objective:   Vitals:   08/24/18 0755  BP: (!) 144/70  Pulse: (!) 56  Resp: 16  Temp: 98.4 F (36.9 C)  SpO2: 99%   BP Readings from Last 3 Encounters:  08/24/18 (!) 144/70  05/04/18 122/66  04/21/18 128/66   Wt Readings from Last 3 Encounters:  08/24/18 181 lb 12.8 oz (82.5 kg)  05/04/18 183 lb 6.4 oz (83.2 kg)  04/21/18 184 lb 12.8 oz (83.8 kg)   Body mass index is 34.35 kg/m.   Physical Exam    Constitutional: Appears well-developed and well-nourished. No distress.  HENT:  Head: Normocephalic and atraumatic.  Neck: Neck supple. No tracheal deviation present. No thyromegaly present.  No cervical lymphadenopathy Cardiovascular: Normal rate, regular rhythm and normal heart sounds.   No murmur heard. No carotid bruit .  No edema Pulmonary/Chest: No reproducible chest pain.  Effort normal and breath sounds normal. No respiratory distress. No has no wheezes. No rales.  Skin: Skin is warm and dry. Not diaphoretic.  Psychiatric: Normal mood and affect. Behavior is normal.      Assessment & Plan:    See Problem List for Assessment and  Plan of chronic medical problems.

## 2018-08-24 ENCOUNTER — Encounter: Payer: Self-pay | Admitting: Internal Medicine

## 2018-08-24 ENCOUNTER — Ambulatory Visit (INDEPENDENT_AMBULATORY_CARE_PROVIDER_SITE_OTHER): Payer: Medicare Other | Admitting: Internal Medicine

## 2018-08-24 ENCOUNTER — Other Ambulatory Visit: Payer: Self-pay

## 2018-08-24 VITALS — BP 144/70 | HR 56 | Temp 98.4°F | Resp 16 | Ht 61.0 in | Wt 181.8 lb

## 2018-08-24 DIAGNOSIS — R0789 Other chest pain: Secondary | ICD-10-CM | POA: Insufficient documentation

## 2018-08-24 NOTE — Patient Instructions (Signed)
You had an EKG today.   A referral was ordered for cardiology.  They will call you to schedule.     Take the pepcid twice daily.   Work on reducing stress.     If your symptoms worsen with increased intensity or frequency please call me immediately.

## 2018-08-24 NOTE — Assessment & Plan Note (Signed)
She has had 4 episodes of chest pain that is mild.  It started last week.  Pain is in the central chest without radiation.  No associated symptoms. Pepcid has helped, but denies GERD Seems to occur at rest and maybe with activity-she has not really paid attention ?  Cardiac, GERD, stress EKG today shows sinus bradycardia at 54 bpm, nonspecific T wave abnormality.  No significant change compared to prior EKG from 10/2013 Advised her to start taking Pepcid twice daily every day Will refer to cardiology for further evaluation Advised her to call if her symptoms worsen in intensity or frequency

## 2018-09-30 ENCOUNTER — Telehealth: Payer: Self-pay

## 2018-09-30 NOTE — Telephone Encounter (Signed)
Spoke with patient to change office visit on 10/04/18 to virtual/ tele visit on same day and time. Patient then informed me that all she had was a little reflux and it has now gone. States she does not want to have a visit unless it is in the office. I then informed her that it may not be until September until that can happen. She says she is willing to wait.

## 2018-10-04 ENCOUNTER — Ambulatory Visit: Payer: Medicare Other | Admitting: Cardiology

## 2018-10-07 ENCOUNTER — Telehealth: Payer: Self-pay | Admitting: Internal Medicine

## 2018-10-07 NOTE — Telephone Encounter (Signed)
LVM to set up virtual.

## 2018-10-07 NOTE — Progress Notes (Deleted)
Virtual Visit via Video Note  I connected with Brooke [name] on 10/07/18 at  8:45 AM EDT by a video enabled telemedicine application and verified that I am speaking with the correct person using two identifiers.   I discussed the limitations of evaluation and management by telemedicine and the availability of in person appointments. The patient expressed understanding and agreed to proceed.  The patient is currently at home and I am in the office.    No referring provider.    History of Present Illness: This is an acute visit for low iron  She tried to donate blood recently and was told that she had low iron and was not able to donate.     Social History   Socioeconomic History  . Marital status: Married    Spouse name: Not on file  . Number of children: 2  . Years of education: 12th   . Highest education level: Not on file  Occupational History  . 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 Financial resource strain: Not on file  . Food insecurity:    Worry: Not on file    Inability: Not on file  . Transportation needs:    Medical: Not on file    Non-medical: Not on file  Tobacco Use  . Smoking status: Never Smoker  . Smokeless tobacco: Never Used  Substance and Sexual Activity  . Alcohol use: No  . Drug use: No  . Sexual activity: Not on file  Lifestyle  . Physical activity:    Days per week: Not on file    Minutes per session: Not on file  . Stress: Not on file  Relationships  . Social connections:    Talks on phone: Not on file    Gets together: Not on file    Attends religious service: Not on file    Active member of club or organization: Not on file    Attends meetings of clubs or organizations: Not on file    Relationship status: Not on file  Other Topics Concern  . 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           Observations/Objective: Appears well in NAD   Assessment and Plan:  See Problem List for Assessment and Plan of chronic medical problems.   Follow Up Instructions:    I discussed the assessment and treatment plan with the patient. The patient was provided an opportunity to ask questions and all were answered. The patient agreed with the plan and demonstrated an understanding of the instructions.   The patient was advised to call back or seek an in-person evaluation if the symptoms worsen or if the condition fails to improve as anticipated.    Binnie Rail, MD

## 2018-10-07 NOTE — Telephone Encounter (Signed)
Virtual Visit has been made for tomorrow 5/1.

## 2018-10-07 NOTE — Telephone Encounter (Signed)
Copied from [place] [id number]. Topic: Quick Communication - See Telephone Encounter >> Oct 07, 2018  1:00 PM Sheran Luz wrote: CRM for notification. See Telephone encounter for: 10/07/18.  Patient requesting call back from PCP as she was told today when trying to to donate blood that her iron is low. She would like to discuss further with Dr. Quay Burow or CMA. Please advise.

## 2018-10-08 ENCOUNTER — Ambulatory Visit (INDEPENDENT_AMBULATORY_CARE_PROVIDER_SITE_OTHER): Payer: Medicare Other | Admitting: Internal Medicine

## 2018-10-08 ENCOUNTER — Other Ambulatory Visit (INDEPENDENT_AMBULATORY_CARE_PROVIDER_SITE_OTHER): Payer: Medicare Other

## 2018-10-08 ENCOUNTER — Encounter: Payer: Self-pay | Admitting: Internal Medicine

## 2018-10-08 VITALS — BP 134/60 | HR 52 | Temp 98.3°F | Resp 16 | Ht 61.0 in | Wt 182.8 lb

## 2018-10-08 DIAGNOSIS — R232 Flushing: Secondary | ICD-10-CM

## 2018-10-08 DIAGNOSIS — Z1211 Encounter for screening for malignant neoplasm of colon: Secondary | ICD-10-CM | POA: Diagnosis not present

## 2018-10-08 DIAGNOSIS — D649 Anemia, unspecified: Secondary | ICD-10-CM

## 2018-10-08 DIAGNOSIS — I10 Essential (primary) hypertension: Secondary | ICD-10-CM | POA: Diagnosis not present

## 2018-10-08 DIAGNOSIS — R7303 Prediabetes: Secondary | ICD-10-CM

## 2018-10-08 LAB — CBC WITH DIFFERENTIAL/PLATELET
Basophils Absolute: 0 10*3/uL (ref 0.0–0.1)
Basophils Relative: 0.2 % (ref 0.0–3.0)
Eosinophils Absolute: 0.3 10*3/uL (ref 0.0–0.7)
Eosinophils Relative: 5.3 % — ABNORMAL HIGH (ref 0.0–5.0)
HCT: 38.6 % (ref 36.0–46.0)
Hemoglobin: 12.4 g/dL (ref 12.0–15.0)
Lymphocytes Relative: 26.3 % (ref 12.0–46.0)
Lymphs Abs: 1.7 10*3/uL (ref 0.7–4.0)
MCHC: 32.2 g/dL (ref 30.0–36.0)
MCV: 72.9 fl — ABNORMAL LOW (ref 78.0–100.0)
Monocytes Absolute: 0.6 10*3/uL (ref 0.1–1.0)
Monocytes Relative: 8.5 % (ref 3.0–12.0)
Neutro Abs: 3.9 10*3/uL (ref 1.4–7.7)
Neutrophils Relative %: 59.7 % (ref 43.0–77.0)
Platelets: 193 10*3/uL (ref 150.0–400.0)
RBC: 5.29 Mil/uL — ABNORMAL HIGH (ref 3.87–5.11)
RDW: 16 % — ABNORMAL HIGH (ref 11.5–15.5)
WBC: 6.5 10*3/uL (ref 4.0–10.5)

## 2018-10-08 LAB — COMPREHENSIVE METABOLIC PANEL
ALT: 12 U/L (ref 0–35)
AST: 15 U/L (ref 0–37)
Albumin: 4.1 g/dL (ref 3.5–5.2)
Alkaline Phosphatase: 93 U/L (ref 39–117)
BUN: 16 mg/dL (ref 6–23)
CO2: 28 mEq/L (ref 19–32)
Calcium: 9.8 mg/dL (ref 8.4–10.5)
Chloride: 102 mEq/L (ref 96–112)
Creatinine, Ser: 0.89 mg/dL (ref 0.40–1.20)
GFR: 75.99 mL/min (ref >60.00)
Glucose, Bld: 89 mg/dL (ref 70–99)
Potassium: 3.7 mEq/L (ref 3.5–5.1)
Sodium: 140 mEq/L (ref 135–145)
Total Bilirubin: 0.7 mg/dL (ref 0.2–1.2)
Total Protein: 7.6 g/dL (ref 6.0–8.3)

## 2018-10-08 LAB — FOLATE: Folate: 6.5 ng/mL (ref >5.9)

## 2018-10-08 LAB — TSH: TSH: 1.86 u[IU]/mL (ref 0.35–4.50)

## 2018-10-08 LAB — HEMOGLOBIN A1C: Hgb A1c MFr Bld: 6.1 % (ref 4.6–6.5)

## 2018-10-08 MED ORDER — POTASSIUM CHLORIDE CRYS ER 10 MEQ PO TBCR: 10.0000 meq | EXTENDED_RELEASE_TABLET | Freq: Every day | ORAL | 3 refills | Status: DC

## 2018-10-08 NOTE — Assessment & Plan Note (Signed)
BP well controlled Current regimen effective and well tolerated Continue current medications at current doses cmp  

## 2018-10-08 NOTE — Patient Instructions (Signed)
  Tests ordered today. Your results will be released to [place] (or called to you) after review, usually within 72hours after test completion. If any changes need to be made, you will be notified at that same time.   Medications reviewed and updated.  Changes include :   none   A referral was ordered for GI for your colonoscopy.

## 2018-10-08 NOTE — Assessment & Plan Note (Signed)
She is taking 1 iron pill daily without side effects He does have a family history of iron deficiency anemia No symptoms consistent with GI bleed and no symptoms suggestive of anemia Check CBC, iron panel, folic acid  She is overdue for colonoscopy-we will refer to GI so that they can get her on the list.  Her father had colon cancer

## 2018-10-08 NOTE — Assessment & Plan Note (Signed)
Check a1c Low sugar / carb diet Stressed regular exercise   

## 2018-10-08 NOTE — Progress Notes (Signed)
Subjective:    Patient ID: Brooke [name], female    DOB: 1950-10-18, 68 y.o.   MRN: [id number]  HPI This is an acute visit for low iron  She tried to donate blood yesterday and was told that she had low iron and was not able to donate.  She does take an iron pill on a daily basis because her level has been low in the past.  She states her mother had iron deficiency anemia.  She has not had any concerning symptoms including fatigue, shortness of breath, lightheadedness.  She denies any blood in the stool or black stool.  She has been experiencing some hot flashes, but is unsure why.   Prediabetes:  She is compliant with a low sugar/carbohydrate diet.  She is exercising regularly.  Hypertension: She is taking her medication daily. She is compliant with a low sodium diet.  She denies chest pain, palpitations, edema, shortness of breath and regular headaches.    Medications and allergies reviewed with patient and updated if appropriate.  Patient Active Problem List   Diagnosis Date Noted  . Chest tightness 08/24/2018  . Viral upper respiratory tract infection 05/04/2018  . Pain of left heel 01/11/2018  . Sleep difficulties 04/15/2016  . Prediabetes 05/07/2015  . Memory difficulties 05/07/2015  . Bilateral leg edema 05/07/2015  . Anemia 10/12/2013  . FH: CAD (coronary artery disease) 04/27/2012  . OSTEOARTHRITIS 03/30/2008  . Hyperlipidemia 07/22/2007  . OBESITY NOS 02/22/2007  . Allergic rhinitis 02/22/2007  . Essential hypertension 02/18/2007    Current Outpatient Medications on File Prior to Visit  Medication Sig Dispense Refill  . amLODipine (NORVASC) 10 MG tablet Take 1 tablet (10 mg total) by mouth daily. 90 tablet 3  . aspirin 81 MG chewable tablet Chew 81 mg by mouth daily.    Marland Kitchen atenolol (TENORMIN) 25 MG tablet Take 1 tablet (25 mg total) by mouth daily. 90 tablet 3  . atorvastatin (LIPITOR) 20 MG tablet TAKE 1 TABLET BY MOUTH ONCE DAILY 90 tablet 3  . [facility] BL[facility]  COHOSH PO Take 1 tablet by mouth.    . ferrous sulfate 325 (65 FE) MG tablet Take 324 mg by mouth daily with breakfast.    . potassium chloride (KLOR-CON M10) 10 MEQ tablet Take 1 tablet (10 mEq total) by mouth 2 (two) times daily. (Patient taking differently: Take 10 mEq by mouth 3 (three) times daily. ) 180 tablet 3  . triamterene-hydrochlorothiazide (MAXZIDE-25) 37.5-25 MG tablet Take 1 tablet by mouth daily. 90 tablet 3   No current facility-administered medications on file prior to visit.     Past Medical History:  Diagnosis Date  . FH: CAD (coronary artery disease)   . High cholesterol   . Hypertension   . Obesity     Past Surgical History:  Procedure Laterality Date  . CARPAL TUNNEL RELEASE     bilateral  . HIP SURGERY      Social History   Socioeconomic History  . Marital status: Married    Spouse name: Not on file  . Number of children: 2  . Years of education: 12th   . Highest education level: Not on file  Occupational History  . 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 Financial resource strain: Not on file  . Food insecurity:    Worry: Not on file    Inability: Not on file  . Transportation needs:  Medical: Not on file    Non-medical: Not on file  Tobacco Use  . Smoking status: Never Smoker  . Smokeless tobacco: Never Used  Substance and Sexual Activity  . Alcohol use: No  . Drug use: No  . Sexual activity: Not on file  Lifestyle  . Physical activity:    Days per week: Not on file    Minutes per session: Not on file  . Stress: Not on file  Relationships  . Social connections:    Talks on phone: Not on file    Gets together: Not on file    Attends religious service: Not on file    Active member of club or organization: Not on file    Attends meetings of clubs or organizations: Not on file    Relationship status: Not on file  Other Topics Concern  . Not on file  Social History Narrative   Pt  was recently laid off from working in Optician, dispensing.         Family History  Problem Relation Age of Onset  . CAD Sister   . CAD Father   . Kidney disease Sister   . Lung cancer Mother   . Brain cancer Mother     Review of Systems  Constitutional: Negative for chills, fatigue and fever.       Hotflashes - day and night x couple of weeks  Respiratory: Negative for cough, shortness of breath and wheezing.   Cardiovascular: Negative for chest pain, palpitations and leg swelling.  Gastrointestinal: Negative for blood in stool (no melena).  Neurological: Negative for dizziness, light-headedness and headaches.       Objective:   Vitals:   10/08/18 0856  BP: 134/60  Pulse: (!) 52  Resp: 16  Temp: 98.3 F (36.8 C)  SpO2: 99%   BP Readings from Last 3 Encounters:  10/08/18 134/60  08/24/18 (!) 144/70  05/04/18 122/66   Wt Readings from Last 3 Encounters:  10/08/18 182 lb 12.8 oz (82.9 kg)  08/24/18 181 lb 12.8 oz (82.5 kg)  05/04/18 183 lb 6.4 oz (83.2 kg)   Body mass index is 34.54 kg/m.   Physical Exam    Constitutional: Appears well-developed and well-nourished. No distress.  HENT:  Head: Normocephalic and atraumatic.  Neck: Neck supple. No tracheal deviation present. No thyromegaly present.  No cervical lymphadenopathy Cardiovascular: Normal rate, regular rhythm and normal heart sounds.   No murmur heard. No carotid bruit .  No edema Pulmonary/Chest: Effort normal and breath sounds normal. No respiratory distress. No has no wheezes. No rales.  Skin: Skin is warm and dry. Not diaphoretic.  Psychiatric: Normal mood and affect. Behavior is normal.      Assessment & Plan:    See Problem List for Assessment and Plan of chronic medical problems.

## 2018-10-08 NOTE — Assessment & Plan Note (Signed)
Has been experiencing hot flashes-?  Reason We will check CBC, TSH, CMP

## 2018-10-09 LAB — IRON,TIBC AND FERRITIN PANEL
%SAT: 22 % (calc) (ref 16–45)
Ferritin: 197 ng/mL (ref 16–288)
Iron: 70 ug/dL (ref 45–160)
TIBC: 315 mcg/dL (calc) (ref 250–450)

## 2018-12-02 ENCOUNTER — Telehealth: Payer: Self-pay | Admitting: Internal Medicine

## 2018-12-02 ENCOUNTER — Telehealth: Payer: Self-pay

## 2018-12-02 MED ORDER — POTASSIUM CHLORIDE CRYS ER 10 MEQ PO TBCR: 10.0000 meq | EXTENDED_RELEASE_TABLET | Freq: Every day | ORAL | 1 refills | Status: DC

## 2018-12-02 NOTE — Telephone Encounter (Signed)
Spoke with pharmacy to clarify directions.

## 2018-12-02 NOTE — Telephone Encounter (Signed)
Copied from [place] [id number]. Topic: General - Other >> Dec 02, 2018  1:21 PM Keene Breath wrote: Reason for CRM: Adonis Brook with [facility] call to get clarification on medication.  Please call back to clarify at [phone]

## 2018-12-02 NOTE — Telephone Encounter (Signed)
Medication Refill - Medication: potassium chloride (KLOR-CON M10) 10 MEQ tablet (patient says this needs to be re-written to say 3 tablets per day and a 90 day supply)  Has the patient contacted their pharmacy? Yes.   (Agent: If no, request that the patient contact the pharmacy for the refill.) (Agent: If yes, when and what did the pharmacy advise?)  Preferred Pharmacy (with phone number or street name):  [facility], [place] [address]  Phone: [phone] Fax: [phone]   Agent: Please be advised that RX refills may take up to 3 business days. We ask that you follow-up with your pharmacy.

## 2018-12-02 NOTE — Telephone Encounter (Signed)
Rx resent with updated directions.

## 2018-12-30 ENCOUNTER — Other Ambulatory Visit: Payer: Self-pay

## 2018-12-30 MED ORDER — POTASSIUM CHLORIDE CRYS ER 10 MEQ PO TBCR: EXTENDED_RELEASE_TABLET | ORAL | 1 refills | Status: DC

## 2019-01-17 ENCOUNTER — Encounter: Payer: Self-pay | Admitting: Internal Medicine

## 2019-01-25 DIAGNOSIS — H35373 Puckering of macula, bilateral: Secondary | ICD-10-CM | POA: Diagnosis not present

## 2019-01-25 DIAGNOSIS — H2513 Age-related nuclear cataract, bilateral: Secondary | ICD-10-CM | POA: Diagnosis not present

## 2019-01-25 DIAGNOSIS — H25013 Cortical age-related cataract, bilateral: Secondary | ICD-10-CM | POA: Diagnosis not present

## 2019-01-25 DIAGNOSIS — H04123 Dry eye syndrome of bilateral lacrimal glands: Secondary | ICD-10-CM | POA: Diagnosis not present

## 2019-04-24 ENCOUNTER — Other Ambulatory Visit: Payer: Self-pay | Admitting: Internal Medicine

## 2019-05-20 ENCOUNTER — Other Ambulatory Visit: Payer: Self-pay | Admitting: Internal Medicine

## 2019-05-26 ENCOUNTER — Other Ambulatory Visit: Payer: Self-pay | Admitting: Internal Medicine

## 2019-05-31 NOTE — Progress Notes (Signed)
Subjective:    Patient ID: Brooke [name], female    DOB: May 07, 1951, 69 y.o.   MRN: [id number]  HPI The patient is here for an acute visit.   Indigestion: She was here earlier this year and complained of indigestion.  It did go away , but came back a couple of weeks ago.  She is taking pepcid 20 mg prn and it helps.  She has been taking it daily, but as needed.  She is eating less than usual.  She denies changes in medications.  She may have increased stress.  Memory concerns:  She thinks her memory is getting worse.  Places she has gone before she has to ask her husband where it is and how to get there.  The other night she woke up in the middle of the night trying to figure out what she was supposed to be doing.  Her brother called last week and they were going to meet at the [facility] and she could not remember how to get there - she knew where it is but could not remember how to get there.  She does not remember things she has read - she has to go back and re-read it.  She is under more stress with her husband - he is on HD and she has to do a lot for him.  She can remember some of conversations, but not all of it.  She has been watching the news a lot and wonders if that is contributing.  Her memory has always been very sharp.    Medications and allergies reviewed with patient and updated if appropriate.  Patient Active Problem List   Diagnosis Date Noted  . GERD (gastroesophageal reflux disease) 06/01/2019  . Hot flashes 10/08/2018  . Chest tightness 08/24/2018  . Pain of left heel 01/11/2018  . Sleep difficulties 04/15/2016  . Prediabetes 05/07/2015  . Memory difficulties 05/07/2015  . Bilateral leg edema 05/07/2015  . Anemia 10/12/2013  . FH: CAD (coronary artery disease) 04/27/2012  . OSTEOARTHRITIS 03/30/2008  . Hyperlipidemia 07/22/2007  . OBESITY NOS 02/22/2007  . Allergic rhinitis 02/22/2007  . Essential hypertension 02/18/2007    Current Outpatient Medications on  File Prior to Visit  Medication Sig Dispense Refill  . amLODipine (NORVASC) 10 MG tablet Take 1 tablet by mouth once daily 90 tablet 0  . aspirin 81 MG chewable tablet Chew 81 mg by mouth daily.    Marland Kitchen atenolol (TENORMIN) 25 MG tablet Take 1 tablet (25 mg total) by mouth daily. **OFFICE VISIT IS DUE FOR FUTURE REFILLS** 30 tablet 0  . atorvastatin (LIPITOR) 20 MG tablet TAKE 1 TABLET BY MOUTH ONCE DAILY . APPOINTMENT REQUIRED FOR FUTURE REFILLS 30 tablet 0  . [facility] BLACK COHOSH PO Take 1 tablet by mouth.    . ferrous sulfate 325 (65 FE) MG tablet Take 324 mg by mouth daily with breakfast.    . potassium chloride (KLOR-CON M10) 10 MEQ tablet Take 1 tablet (10 mEq total) by mouth three times daily. 270 tablet 1  . triamterene-hydrochlorothiazide (MAXZIDE-25) 37.5-25 MG tablet Take 1 tablet by mouth once daily 90 tablet 0   No current facility-administered medications on file prior to visit.    Past Medical History:  Diagnosis Date  . FH: CAD (coronary artery disease)   . High cholesterol   . Hypertension   . Obesity     Past Surgical History:  Procedure Laterality Date  . CARPAL TUNNEL RELEASE  bilateral  . HIP SURGERY      Social History   Socioeconomic History  . Marital status: Married    Spouse name: Not on file  . Number of children: 2  . Years of education: 12th   . Highest education level: Not on file  Occupational History  . Occupation: Psychologist, sport and exercise for [employer]    Comment: Laid Off  Tobacco Use  . Smoking status: Never Smoker  . Smokeless tobacco: Never Used  Substance and Sexual Activity  . Alcohol use: No  . Drug use: No  . Sexual activity: Not on file  Other Topics Concern  . 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        Social Determinants of Health   Financial Resource Strain:   . Difficulty of Paying Living Expenses: Not on file  Food Insecurity:   . Worried About  Charity fundraiser in the Last Year: Not on file  . Ran Out of Food in the Last Year: Not on file  Transportation Needs:   . Lack of Transportation (Medical): Not on file  . Lack of Transportation (Non-Medical): Not on file  Physical Activity:   . Days of Exercise per Week: Not on file  . Minutes of Exercise per Session: Not on file  Stress:   . Feeling of Stress : Not on file  Social Connections:   . Frequency of Communication with Friends and Family: Not on file  . Frequency of Social Gatherings with Friends and Family: Not on file  . Attends Religious Services: Not on file  . Active Member of Clubs or Organizations: Not on file  . Attends Archivist Meetings: Not on file  . Marital Status: Not on file    Family History  Problem Relation Age of Onset  . CAD Sister   . CAD Father   . Kidney disease Sister   . Lung cancer Mother   . Brain cancer Mother     Review of Systems  Constitutional: Negative for fever.  HENT: Negative for trouble swallowing.   Respiratory: Negative for shortness of breath.   Cardiovascular: Negative for chest pain and palpitations.  Gastrointestinal: Negative for abdominal pain and nausea.  Neurological: Negative for light-headedness and headaches.  Psychiatric/Behavioral: Positive for sleep disturbance. Negative for dysphoric mood. The patient is nervous/anxious.        Objective:   Vitals:   06/01/19 0844  BP: 128/70  Pulse: 60  Resp: 16  Temp: 99 F (37.2 C)  SpO2: 98%   BP Readings from Last 3 Encounters:  06/01/19 128/70  10/08/18 134/60  08/24/18 (!) 144/70   Wt Readings from Last 3 Encounters:  06/01/19 183 lb (83 kg)  10/08/18 182 lb 12.8 oz (82.9 kg)  08/24/18 181 lb 12.8 oz (82.5 kg)   Body mass index is 34.58 kg/m.   Physical Exam    Constitutional: Appears well-developed and well-nourished. No distress.  HENT:  Head: Normocephalic and atraumatic.  Neck: Neck supple. No tracheal deviation present. No  thyromegaly present.  No cervical lymphadenopathy Cardiovascular: Normal rate, regular rhythm and normal heart sounds.   No murmur heard. No carotid bruit .  No edema Pulmonary/Chest: Effort normal and breath sounds normal. No respiratory distress. No has no wheezes. No rales.  Skin: Skin is warm and dry. Not diaphoretic.  Psychiatric: Normal mood and affect. Behavior is normal.  Assessment & Plan:    See Problem List for Assessment and Plan of chronic medical problems.    This visit occurred during the SARS-CoV-2 public health emergency.  Safety protocols were in place, including screening questions prior to the visit, additional usage of staff PPE, and extensive cleaning of exam room while observing appropriate contact time as indicated for disinfecting solutions.

## 2019-06-01 ENCOUNTER — Ambulatory Visit (INDEPENDENT_AMBULATORY_CARE_PROVIDER_SITE_OTHER): Payer: Medicare Other | Admitting: Internal Medicine

## 2019-06-01 ENCOUNTER — Other Ambulatory Visit: Payer: Self-pay

## 2019-06-01 ENCOUNTER — Encounter: Payer: Self-pay | Admitting: Internal Medicine

## 2019-06-01 VITALS — BP 128/70 | HR 60 | Temp 99.0°F | Resp 16 | Ht 61.0 in | Wt 183.0 lb

## 2019-06-01 DIAGNOSIS — K219 Gastro-esophageal reflux disease without esophagitis: Secondary | ICD-10-CM

## 2019-06-01 DIAGNOSIS — R413 Other amnesia: Secondary | ICD-10-CM

## 2019-06-01 DIAGNOSIS — Z23 Encounter for immunization: Secondary | ICD-10-CM

## 2019-06-01 HISTORY — DX: Gastro-esophageal reflux disease without esophagitis: K21.9

## 2019-06-01 MED ORDER — FAMOTIDINE 40 MG PO TABS: 40.0000 mg | ORAL_TABLET | Freq: Every day | ORAL | 5 refills | Status: DC

## 2019-06-01 NOTE — Patient Instructions (Signed)
Take pepcid 40 mg daily for 2-3 weeks daily.  You can then stop it and take 20 mg or 40 mg as needed.  The least amount of medication the better.    Work on making the lifestyle changes we discussed.  If your memory is still a concern and you want to be referred for official testing let me know.

## 2019-06-01 NOTE — Assessment & Plan Note (Signed)
Increased GERD, occurring daily No obvious food cause, but stress may be a potential cause Start Pepcid 40 mg daily-take for numeral 68--year-old 3 weeks and then try to taper off Goal is to get to 20 mg as needed again Advised her to call with any questions or concerns

## 2019-06-01 NOTE — Assessment & Plan Note (Signed)
Having some memory issues-in particular difficulty remembering which she read and difficulty remembering how to get places that she has been before After discussing this with her she is under more stress than she realizes Discussed further evaluating memory with referral to neurology/neuropsychology testing, but she deferred for right now We discussed several things she can change in her lifestyle that may help She will not watch the news is much She will try to start doing some the activities and hobbies that she enjoys to help control some of the stress with caring for her husband who is on hemodialysis She deferred medication or therapy If she does not see improvement she will let me know and I can refer her for further evaluation at that time

## 2019-06-04 ENCOUNTER — Ambulatory Visit (HOSPITAL_COMMUNITY)
Admission: EM | Admit: 2019-06-04 | Discharge: 2019-06-04 | Disposition: A | Discharge status: Home / Self Care | Payer: Medicare Other | Attending: Emergency Medicine | Admitting: Emergency Medicine

## 2019-06-04 ENCOUNTER — Encounter (HOSPITAL_COMMUNITY): Payer: Self-pay

## 2019-06-04 ENCOUNTER — Other Ambulatory Visit: Payer: Self-pay

## 2019-06-04 DIAGNOSIS — Z20822 Contact with and (suspected) exposure to covid-19: Secondary | ICD-10-CM

## 2019-06-04 DIAGNOSIS — Z20828 Contact with and (suspected) exposure to other viral communicable diseases: Secondary | ICD-10-CM | POA: Diagnosis not present

## 2019-06-04 NOTE — Discharge Instructions (Signed)
Person Under Monitoring Name: Brooke [name]  Location: [address] [place] [address]   Infection Prevention Recommendations for Individuals Confirmed to have, or Being Evaluated for, 2019 Novel Coronavirus (COVID-19) Infection Who Receive Care at Home  Individuals who are confirmed to have, or are being evaluated for, COVID-19 should follow the prevention steps below until a healthcare provider or local or state health department says they can return to normal activities.  Stay home except to get medical care You should restrict activities outside your home, except for getting medical care. Do not go to work, school, or public areas, and do not use public transportation or taxis.  Call ahead before visiting your doctor Before your medical appointment, call the healthcare provider and tell them that you have, or are being evaluated for, COVID-19 infection. This will help the healthcare provider's office take steps to keep other people from getting infected. Ask your healthcare provider to call the local or state health department.  Monitor your symptoms Seek prompt medical attention if your illness is worsening (e.g., difficulty breathing). Before going to your medical appointment, call the healthcare provider and tell them that you have, or are being evaluated for, COVID-19 infection. Ask your healthcare provider to call the local or state health department.  Wear a facemask You should wear a facemask that covers your nose and mouth when you are in the same room with other people and when you visit a healthcare provider. People who live with or visit you should also wear a facemask while they are in the same room with you.  Separate yourself from other people in your home As much as possible, you should stay in a different room from other people in your home. Also, you should use a separate bathroom, if available.  Avoid sharing household items You should not  share dishes, drinking glasses, cups, eating utensils, towels, bedding, or other items with other people in your home. After using these items, you should wash them thoroughly with soap and water.  Cover your coughs and sneezes Cover your mouth and nose with a tissue when you cough or sneeze, or you can cough or sneeze into your sleeve. Throw used tissues in a lined trash can, and immediately wash your hands with soap and water for at least 20 seconds or use an alcohol-based hand rub.  Wash your [facility] your hands often and thoroughly with soap and water for at least 20 seconds. You can use an alcohol-based hand sanitizer if soap and water are not available and if your hands are not visibly dirty. Avoid touching your eyes, nose, and mouth with unwashed hands.   Prevention Steps for Caregivers and Household Members of Individuals Confirmed to have, or Being Evaluated for, COVID-19 Infection Being Cared for in the Home  If you live with, or provide care at home for, a person confirmed to have, or being evaluated for, COVID-19 infection please follow these guidelines to prevent infection:  Follow healthcare provider's instructions Make sure that you understand and can help the patient follow any healthcare provider instructions for all care.  Provide for the patient's basic needs You should help the patient with basic needs in the home and provide support for getting groceries, prescriptions, and other personal needs.  Monitor the patient's symptoms If they are getting sicker, call his or her medical provider and tell them that the patient has, or is being evaluated for, COVID-19 infection. This will help the healthcare provider's office  take steps to keep other people from getting infected. Ask the healthcare provider to call the local or state health department.  Limit the number of people who have contact with the patient If possible, have only one caregiver for the  patient. Other household members should stay in another home or place of residence. If this is not possible, they should stay in another room, or be separated from the patient as much as possible. Use a separate bathroom, if available. Restrict visitors who do not have an essential need to be in the home.  Keep older adults, very young children, and other sick people away from the patient Keep older adults, very young children, and those who have compromised immune systems or chronic health conditions away from the patient. This includes people with chronic heart, lung, or kidney conditions, diabetes, and cancer.  Ensure good ventilation Make sure that shared spaces in the home have good air flow, such as from an air conditioner or an opened window, weather permitting.  Wash your hands often Wash your hands often and thoroughly with soap and water for at least 20 seconds. You can use an alcohol based hand sanitizer if soap and water are not available and if your hands are not visibly dirty. Avoid touching your eyes, nose, and mouth with unwashed hands. Use disposable paper towels to dry your hands. If not available, use dedicated cloth towels and replace them when they become wet.  Wear a facemask and gloves Wear a disposable facemask at all times in the room and gloves when you touch or have contact with the patient's blood, body fluids, and/or secretions or excretions, such as sweat, saliva, sputum, nasal mucus, vomit, urine, or feces.  Ensure the mask fits over your nose and mouth tightly, and do not touch it during use. Throw out disposable facemasks and gloves after using them. Do not reuse. Wash your hands immediately after removing your facemask and gloves. If your personal clothing becomes contaminated, carefully remove clothing and launder. Wash your hands after handling contaminated clothing. Place all used disposable facemasks, gloves, and other waste in a lined container before  disposing them with other household waste. Remove gloves and wash your hands immediately after handling these items.  Do not share dishes, glasses, or other household items with the patient Avoid sharing household items. You should not share dishes, drinking glasses, cups, eating utensils, towels, bedding, or other items with a patient who is confirmed to have, or being evaluated for, COVID-19 infection. After the person uses these items, you should wash them thoroughly with soap and water.  Wash laundry thoroughly Immediately remove and wash clothes or bedding that have blood, body fluids, and/or secretions or excretions, such as sweat, saliva, sputum, nasal mucus, vomit, urine, or feces, on them. Wear gloves when handling laundry from the patient. Read and follow directions on labels of laundry or clothing items and detergent. In general, wash and dry with the warmest temperatures recommended on the label.  Clean all areas the individual has used often Clean all touchable surfaces, such as counters, tabletops, doorknobs, bathroom fixtures, toilets, phones, keyboards, tablets, and bedside tables, every day. Also, clean any surfaces that may have blood, body fluids, and/or secretions or excretions on them. Wear gloves when cleaning surfaces the patient has come in contact with. Use a diluted bleach solution (e.g., dilute bleach with 1 part bleach and 10 parts water) or a household disinfectant with a label that says EPA-registered for coronaviruses. To make a bleach  solution at home, add 1 tablespoon of bleach to 1 quart (4 cups) of water. For a larger supply, add  cup of bleach to 1 gallon (16 cups) of water. Read labels of cleaning products and follow recommendations provided on product labels. Labels contain instructions for safe and effective use of the cleaning product including precautions you should take when applying the product, such as wearing gloves or eye protection and making sure you  have good ventilation during use of the product. Remove gloves and wash hands immediately after cleaning.  Monitor yourself for signs and symptoms of illness Caregivers and household members are considered close contacts, should monitor their health, and will be asked to limit movement outside of the home to the extent possible. Follow the monitoring steps for close contacts listed on the symptom monitoring form.   ? If you have additional questions, contact your local health department or call the epidemiologist on call at [phone] (available 24/7). ? This guidance is subject to change. For the most up-to-date guidance from [facility], please refer to their website: [email or web address]

## 2019-06-04 NOTE — ED Triage Notes (Signed)
Pt presents for covid testing with non direct exposure and no symptoms. 

## 2019-06-05 LAB — NOVEL CORONAVIRUS, NAA (HOSP ORDER, SEND-OUT TO REF LAB; TAT 18-24 HRS): SARS-CoV-2, NAA: NOT DETECTED

## 2019-06-05 NOTE — ED Provider Notes (Signed)
[place]    CSN: [id number] Arrival date & time: 06/04/19  1517      History   Chief Complaint Chief Complaint  Patient presents with  . Covid Testing    HPI Brooke [name] is a 68 y.o. female history of hypertension, hyperlipidemia, GERD, presenting today for Covid testing.  Patient had indirect exposure to Covid yesterday.  Patient states that she was around her brother yesterday who recently found out a coworker's wife is positive.  She herself has not had any symptoms.  Denies cough, congestion or sore throat.  Denies fevers chills or body aches.  Denies nausea vomiting or diarrhea.  Denies loss of appetite, smell or taste.  HPI  Past Medical History:  Diagnosis Date  . FH: CAD (coronary artery disease)   . High cholesterol   . Hypertension   . Obesity     Patient Active Problem List   Diagnosis Date Noted  . GERD (gastroesophageal reflux disease) 06/01/2019  . Hot flashes 10/08/2018  . Chest tightness 08/24/2018  . Pain of left heel 01/11/2018  . Sleep difficulties 04/15/2016  . Prediabetes 05/07/2015  . Memory difficulties 05/07/2015  . Bilateral leg edema 05/07/2015  . Anemia 10/12/2013  . FH: CAD (coronary artery disease) 04/27/2012  . OSTEOARTHRITIS 03/30/2008  . Hyperlipidemia 07/22/2007  . OBESITY NOS 02/22/2007  . Allergic rhinitis 02/22/2007  . Essential hypertension 02/18/2007    Past Surgical History:  Procedure Laterality Date  . CARPAL TUNNEL RELEASE     bilateral  . HIP SURGERY      OB History   No obstetric history on file.      Home Medications    Prior to Admission medications   Medication Sig Start Date End Date Taking? Authorizing Provider  amLODipine (NORVASC) 10 MG tablet Take 1 tablet by mouth once daily 05/26/19   Binnie Rail, MD  aspirin 81 MG chewable tablet Chew 81 mg by mouth daily.    [provider]  atenolol (TENORMIN) 25 MG tablet Take 1 tablet (25 mg total) by mouth daily. **OFFICE  VISIT IS DUE FOR FUTURE REFILLS** 05/20/19   Binnie Rail, MD  atorvastatin (LIPITOR) 20 MG tablet TAKE 1 TABLET BY MOUTH ONCE DAILY . APPOINTMENT REQUIRED FOR FUTURE REFILLS 05/20/19   Binnie Rail, MD  [facility] PO Take 1 tablet by mouth.    [provider]  famotidine (PEPCID) 40 MG tablet Take 1 tablet (40 mg total) by mouth daily. 06/01/19   Binnie Rail, MD  ferrous sulfate 325 (65 FE) MG tablet Take 324 mg by mouth daily with breakfast.    [provider]  potassium chloride (KLOR-CON M10) 10 MEQ tablet Take 1 tablet (10 mEq total) by mouth three times daily. 12/30/18   Binnie Rail, MD  triamterene-hydrochlorothiazide [facility]) 37.5-25 MG tablet Take 1 tablet by mouth once daily 05/26/19   Binnie Rail, MD    Family History Family History  Problem Relation Age of Onset  . CAD Sister   . CAD Father   . Kidney disease Sister   . Lung cancer Mother   . Brain cancer Mother     Social History Social History   Tobacco Use  . Smoking status: Never Smoker  . Smokeless tobacco: Never Used  Substance Use Topics  . Alcohol use: No  . Drug use: No     Allergies   Penicillins   Review of Systems Review of Systems  Constitutional:  Negative for activity change, appetite change, chills, fatigue and fever.  HENT: Negative for congestion, ear pain, rhinorrhea, sinus pressure, sore throat and trouble swallowing.   Eyes: Negative for discharge and redness.  Respiratory: Negative for cough, chest tightness and shortness of breath.   Cardiovascular: Negative for chest pain.  Gastrointestinal: Negative for abdominal pain, diarrhea, nausea and vomiting.  Musculoskeletal: Negative for myalgias.  Skin: Negative for rash.  Neurological: Negative for dizziness, light-headedness and headaches.     Physical Exam Triage Vital Signs ED Triage Vitals [06/04/19 1552]  Enc Vitals Group     BP (!) 144/57     Pulse Rate 71     Resp (!) 22     Temp 98.4  F (36.9 C)     Temp Source Oral     SpO2 99 %     Weight      Height      Head Circumference      Peak Flow      Pain Score 0     Pain Loc      Pain Edu?      Excl. in [place]?    No data found.  Updated Vital Signs BP (!) 144/57 (BP Location: Right Arm)   Pulse 71   Temp 98.4 F (36.9 C) (Oral)   Resp (!) 22   SpO2 99%   Visual Acuity Right Eye Distance:   Left Eye Distance:   Bilateral Distance:    Right Eye Near:   Left Eye Near:    Bilateral Near:     Physical Exam Vitals and nursing note reviewed.  Constitutional:      Appearance: She is well-developed.     Comments: No acute distress  HENT:     Head: Normocephalic and atraumatic.     Nose: Nose normal.  Eyes:     Conjunctiva/sclera: Conjunctivae normal.  Cardiovascular:     Rate and Rhythm: Normal rate.  Pulmonary:     Effort: Pulmonary effort is normal. No respiratory distress.     Comments: Breathing comfortably at rest, CTABL, no wheezing, rales or other adventitious sounds auscultated Abdominal:     General: There is no distension.  Musculoskeletal:        General: Normal range of motion.     Cervical back: Neck supple.  Skin:    General: Skin is warm and dry.  Neurological:     Mental Status: She is alert and oriented to person, place, and time.      UC Treatments / Results  Labs (all labs ordered are listed, but only abnormal results are displayed) Labs Reviewed  NOVEL CORONAVIRUS, NAA (HOSP ORDER, SEND-OUT TO REF LAB; TAT 18-24 HRS)    EKG   Radiology No results found.  Procedures Procedures (including critical care time)  Medications Ordered in UC Medications - No data to display  Initial Impression / Assessment and Plan / UC Course  I have reviewed the triage vital signs and the nursing notes.  Pertinent labs & imaging results that were available during my care of the patient were reviewed by me and considered in my medical decision making (see chart for details).      Covid PCR pending.  Currently asymptomatic.  Discussed possible false negatives depending on timing of exposure.  Recommended to continue to monitor symptoms, recommending repeat testing if developing any concerning symptoms for Covid.  Discussed strict return precautions. Patient verbalized understanding and is agreeable with plan.  Final Clinical Impressions(s) / UC  Diagnoses   Final diagnoses:  Encounter for laboratory testing for COVID-19 virus  Exposure to COVID-19 virus     Discharge Instructions        Person Under Monitoring Name: Brooke [name]  Location: [address] [place] [address]   Infection Prevention Recommendations for Individuals Confirmed to have, or Being Evaluated for, 2019 Novel Coronavirus (COVID-19) Infection Who Receive Care at Home  Individuals who are confirmed to have, or are being evaluated for, COVID-19 should follow the prevention steps below until a healthcare provider or local or state health department says they can return to normal activities.  Stay home except to get medical care You should restrict activities outside your home, except for getting medical care. Do not go to work, school, or public areas, and do not use public transportation or taxis.  Call ahead before visiting your doctor Before your medical appointment, call the healthcare provider and tell them that you have, or are being evaluated for, COVID-19 infection. This will help the healthcare provider's office take steps to keep other people from getting infected. Ask your healthcare provider to call the local or state health department.  Monitor your symptoms Seek prompt medical attention if your illness is worsening (e.g., difficulty breathing). Before going to your medical appointment, call the healthcare provider and tell them that you have, or are being evaluated for, COVID-19 infection. Ask your healthcare provider to call the local or state health  department.  Wear a facemask You should wear a facemask that covers your nose and mouth when you are in the same room with other people and when you visit a healthcare provider. People who live with or visit you should also wear a facemask while they are in the same room with you.  Separate yourself from other people in your home As much as possible, you should stay in a different room from other people in your home. Also, you should use a separate bathroom, if available.  Avoid sharing household items You should not share dishes, drinking glasses, cups, eating utensils, towels, bedding, or other items with other people in your home. After using these items, you should wash them thoroughly with soap and water.  Cover your coughs and sneezes Cover your mouth and nose with a tissue when you cough or sneeze, or you can cough or sneeze into your sleeve. Throw used tissues in a lined trash can, and immediately wash your hands with soap and water for at least 20 seconds or use an alcohol-based hand rub.  Wash your [facility] your hands often and thoroughly with soap and water for at least 20 seconds. You can use an alcohol-based hand sanitizer if soap and water are not available and if your hands are not visibly dirty. Avoid touching your eyes, nose, and mouth with unwashed hands.   Prevention Steps for Caregivers and Household Members of Individuals Confirmed to have, or Being Evaluated for, COVID-19 Infection Being Cared for in the Home  If you live with, or provide care at home for, a person confirmed to have, or being evaluated for, COVID-19 infection please follow these guidelines to prevent infection:  Follow healthcare provider's instructions Make sure that you understand and can help the patient follow any healthcare provider instructions for all care.  Provide for the patient's basic needs You should help the patient with basic needs in the home and provide support for getting  groceries, prescriptions, and other personal needs.  Monitor the patient's symptoms If they  are getting sicker, call his or her medical provider and tell them that the patient has, or is being evaluated for, COVID-19 infection. This will help the healthcare provider's office take steps to keep other people from getting infected. Ask the healthcare provider to call the local or state health department.  Limit the number of people who have contact with the patient  If possible, have only one caregiver for the patient.  Other household members should stay in another home or place of residence. If this is not possible, they should stay  in another room, or be separated from the patient as much as possible. Use a separate bathroom, if available.  Restrict visitors who do not have an essential need to be in the home.  Keep older adults, very young children, and other sick people away from the patient Keep older adults, very young children, and those who have compromised immune systems or chronic health conditions away from the patient. This includes people with chronic heart, lung, or kidney conditions, diabetes, and cancer.  Ensure good ventilation Make sure that shared spaces in the home have good air flow, such as from an air conditioner or an opened window, weather permitting.  Wash your hands often  Wash your hands often and thoroughly with soap and water for at least 20 seconds. You can use an alcohol based hand sanitizer if soap and water are not available and if your hands are not visibly dirty.  Avoid touching your eyes, nose, and mouth with unwashed hands.  Use disposable paper towels to dry your hands. If not available, use dedicated cloth towels and replace them when they become wet.  Wear a facemask and gloves  Wear a disposable facemask at all times in the room and gloves when you touch or have contact with the patient's blood, body fluids, and/or secretions or excretions,  such as sweat, saliva, sputum, nasal mucus, vomit, urine, or feces.  Ensure the mask fits over your nose and mouth tightly, and do not touch it during use.  Throw out disposable facemasks and gloves after using them. Do not reuse.  Wash your hands immediately after removing your facemask and gloves.  If your personal clothing becomes contaminated, carefully remove clothing and launder. Wash your hands after handling contaminated clothing.  Place all used disposable facemasks, gloves, and other waste in a lined container before disposing them with other household waste.  Remove gloves and wash your hands immediately after handling these items.  Do not share dishes, glasses, or other household items with the patient  Avoid sharing household items. You should not share dishes, drinking glasses, cups, eating utensils, towels, bedding, or other items with a patient who is confirmed to have, or being evaluated for, COVID-19 infection.  After the person uses these items, you should wash them thoroughly with soap and water.  Wash laundry thoroughly  Immediately remove and wash clothes or bedding that have blood, body fluids, and/or secretions or excretions, such as sweat, saliva, sputum, nasal mucus, vomit, urine, or feces, on them.  Wear gloves when handling laundry from the patient.  Read and follow directions on labels of laundry or clothing items and detergent. In general, wash and dry with the warmest temperatures recommended on the label.  Clean all areas the individual has used often  Clean all touchable surfaces, such as counters, tabletops, doorknobs, bathroom fixtures, toilets, phones, keyboards, tablets, and bedside tables, every day. Also, clean any surfaces that may have blood, body fluids, and/or secretions  or excretions on them.  Wear gloves when cleaning surfaces the patient has come in contact with.  Use a diluted bleach solution (e.g., dilute bleach with 1 part bleach and 10  parts water) or a household disinfectant with a label that says EPA-registered for coronaviruses. To make a bleach solution at home, add 1 tablespoon of bleach to 1 quart (4 cups) of water. For a larger supply, add  cup of bleach to 1 gallon (16 cups) of water.  Read labels of cleaning products and follow recommendations provided on product labels. Labels contain instructions for safe and effective use of the cleaning product including precautions you should take when applying the product, such as wearing gloves or eye protection and making sure you have good ventilation during use of the product.  Remove gloves and wash hands immediately after cleaning.  Monitor yourself for signs and symptoms of illness Caregivers and household members are considered close contacts, should monitor their health, and will be asked to limit movement outside of the home to the extent possible. Follow the monitoring steps for close contacts listed on the symptom monitoring form.   ? If you have additional questions, contact your local health department or call the epidemiologist on call at [phone] (available 24/7). ? This guidance is subject to change. For the most up-to-date guidance from [facility], please refer to their website: YouBlogs.pl   ED Prescriptions    None     PDMP not reviewed this encounter.   Janith Lima, [place] 06/05/19 [phone]

## 2019-07-01 ENCOUNTER — Other Ambulatory Visit: Payer: Self-pay | Admitting: Internal Medicine

## 2019-07-01 MED ORDER — POTASSIUM CHLORIDE CRYS ER 10 MEQ PO TBCR: EXTENDED_RELEASE_TABLET | ORAL | 0 refills | Status: DC

## 2019-07-01 MED ORDER — FAMOTIDINE 40 MG PO TABS: 40.0000 mg | ORAL_TABLET | Freq: Every day | ORAL | 0 refills | Status: DC

## 2019-07-01 MED ORDER — TRIAMTERENE-HCTZ 37.5-25 MG PO TABS: 1.0000 | ORAL_TABLET | Freq: Every day | ORAL | 0 refills | Status: DC

## 2019-07-01 MED ORDER — AMLODIPINE BESYLATE 10 MG PO TABS: 10.0000 mg | ORAL_TABLET | Freq: Every day | ORAL | 0 refills | Status: DC

## 2019-07-01 NOTE — Telephone Encounter (Signed)
        1. Which medications need to be refilled? (please list name of each medication and dose if known)  amLODipine (NORVASC) 10 MG tablet atenolol (TENORMIN) 25 MG tablet atorvastatin (LIPITOR) 20 MG tablet famotidine (PEPCID) 40 MG tablet potassium chloride (KLOR-CON M10) 10 MEQ tablet triamterene-hydrochlorothiazide (MAXZIDE-25) 37.5-25 MG tablet   2. Which pharmacy/location (including street and city if local pharmacy) is medication to be sent to? [place], [address]  3. Do they need a 30 day or 90 day supply? [place]

## 2019-07-01 NOTE — Addendum Note (Signed)
Addended by: Earnstine Regal on: 07/01/2019 01:28 PM   Modules accepted: Orders

## 2019-07-02 ENCOUNTER — Ambulatory Visit: Payer: Medicare Other | Attending: Internal Medicine

## 2019-07-02 DIAGNOSIS — Z23 Encounter for immunization: Secondary | ICD-10-CM | POA: Insufficient documentation

## 2019-07-12 ENCOUNTER — Encounter: Payer: Self-pay | Admitting: Internal Medicine

## 2019-07-12 ENCOUNTER — Ambulatory Visit (INDEPENDENT_AMBULATORY_CARE_PROVIDER_SITE_OTHER): Payer: Medicare Other | Admitting: Internal Medicine

## 2019-07-12 ENCOUNTER — Other Ambulatory Visit: Payer: Self-pay

## 2019-07-12 VITALS — BP 140/72 | HR 51 | Temp 98.8°F | Resp 16 | Ht 61.0 in | Wt 182.4 lb

## 2019-07-12 DIAGNOSIS — R7303 Prediabetes: Secondary | ICD-10-CM | POA: Diagnosis not present

## 2019-07-12 DIAGNOSIS — K219 Gastro-esophageal reflux disease without esophagitis: Secondary | ICD-10-CM | POA: Diagnosis not present

## 2019-07-12 DIAGNOSIS — I10 Essential (primary) hypertension: Secondary | ICD-10-CM

## 2019-07-12 DIAGNOSIS — E7849 Other hyperlipidemia: Secondary | ICD-10-CM

## 2019-07-12 DIAGNOSIS — Z0000 Encounter for general adult medical examination without abnormal findings: Secondary | ICD-10-CM

## 2019-07-12 DIAGNOSIS — E6609 Other obesity due to excess calories: Secondary | ICD-10-CM

## 2019-07-12 DIAGNOSIS — Z6834 Body mass index (BMI) 34.0-34.9, adult: Secondary | ICD-10-CM

## 2019-07-12 LAB — CBC WITH DIFFERENTIAL/PLATELET
Basophils Absolute: 0.1 10*3/uL (ref 0.0–0.1)
Basophils Relative: 1.7 % (ref 0.0–3.0)
Eosinophils Absolute: 0.4 10*3/uL (ref 0.0–0.7)
Eosinophils Relative: 5.3 % — ABNORMAL HIGH (ref 0.0–5.0)
HCT: 37.3 % (ref 36.0–46.0)
Hemoglobin: 11.8 g/dL — ABNORMAL LOW (ref 12.0–15.0)
Lymphocytes Relative: 25.5 % (ref 12.0–46.0)
Lymphs Abs: 1.9 10*3/uL (ref 0.7–4.0)
MCHC: 31.5 g/dL (ref 30.0–36.0)
MCV: 73.7 fl — ABNORMAL LOW (ref 78.0–100.0)
Monocytes Absolute: 0.7 10*3/uL (ref 0.1–1.0)
Monocytes Relative: 9.3 % (ref 3.0–12.0)
Neutro Abs: 4.3 10*3/uL (ref 1.4–7.7)
Neutrophils Relative %: 58.2 % (ref 43.0–77.0)
Platelets: 194 10*3/uL (ref 150.0–400.0)
RBC: 5.06 Mil/uL (ref 3.87–5.11)
RDW: 16 % — ABNORMAL HIGH (ref 11.5–15.5)
WBC: 7.5 10*3/uL (ref 4.0–10.5)

## 2019-07-12 LAB — COMPREHENSIVE METABOLIC PANEL
ALT: 20 U/L (ref 0–35)
AST: 18 U/L (ref 0–37)
Albumin: 3.8 g/dL (ref 3.5–5.2)
Alkaline Phosphatase: 81 U/L (ref 39–117)
BUN: 13 mg/dL (ref 6–23)
CO2: 28 mEq/L (ref 19–32)
Calcium: 10 mg/dL (ref 8.4–10.5)
Chloride: 103 mEq/L (ref 96–112)
Creatinine, Ser: 1.05 mg/dL (ref 0.40–1.20)
GFR: 62.65 mL/min (ref >60.00)
Glucose, Bld: 95 mg/dL (ref 70–99)
Potassium: 3.9 mEq/L (ref 3.5–5.1)
Sodium: 138 mEq/L (ref 135–145)
Total Bilirubin: 0.6 mg/dL (ref 0.2–1.2)
Total Protein: 7.2 g/dL (ref 6.0–8.3)

## 2019-07-12 LAB — LIPID PANEL
Cholesterol: 145 mg/dL (ref 0–200)
HDL: 54.5 mg/dL
LDL Cholesterol: 71 mg/dL (ref 0–99)
NonHDL: 90.92
Total CHOL/HDL Ratio: 3
Triglycerides: 100 mg/dL (ref 0.0–149.0)
VLDL: 20 mg/dL (ref 0.0–40.0)

## 2019-07-12 LAB — HEMOGLOBIN A1C: Hgb A1c MFr Bld: 6.2 % (ref 4.6–6.5)

## 2019-07-12 NOTE — Assessment & Plan Note (Signed)
Chronic Check lipid panel  Continue daily statin Regular exercise and healthy diet encouraged  

## 2019-07-12 NOTE — Patient Instructions (Addendum)
Blood work was ordered.    All other Health Maintenance issues reviewed.   All recommended immunizations and age-appropriate screenings are up-to-date or discussed.  No immunization administered today.   Medications reviewed and updated.  Changes include :   none   Please followup in 6 months   Please call GI to schedule your colonoscopy at [phone]  Please schedule your mammogram.   Your BMI or body mass index is 34.46, which classifies you as obese.  You need to work on weight loss.       Health Maintenance, Female Adopting a healthy lifestyle and getting preventive care are important in promoting health and wellness. Ask your health care provider about:  The right schedule for you to have regular tests and exams.  Things you can do on your own to prevent diseases and keep yourself healthy. What should I know about diet, weight, and exercise? Eat a healthy diet   Eat a diet that includes plenty of vegetables, fruits, low-fat dairy products, and lean protein.  Do not eat a lot of foods that are high in solid fats, added sugars, or sodium. Maintain a healthy weight Body mass index (BMI) is used to identify weight problems. It estimates body fat based on height and weight. Your health care provider can help determine your BMI and help you achieve or maintain a healthy weight. Get regular exercise Get regular exercise. This is one of the most important things you can do for your health. Most adults should:  Exercise for at least 150 minutes each week. The exercise should increase your heart rate and make you sweat (moderate-intensity exercise).  Do strengthening exercises at least twice a week. This is in addition to the moderate-intensity exercise.  Spend less time sitting. Even light physical activity can be beneficial. Watch cholesterol and blood lipids Have your blood tested for lipids and cholesterol at 71 years of age, then have this test every 5 years. Have  your cholesterol levels checked more often if:  Your lipid or cholesterol levels are high.  You are older than 71 years of age.  You are at high risk for heart disease. What should I know about cancer screening? Depending on your health history and family history, you may need to have cancer screening at various ages. This may include screening for:  Breast cancer.  Cervical cancer.  Colorectal cancer.  Skin cancer.  Lung cancer. What should I know about heart disease, diabetes, and high blood pressure? Blood pressure and heart disease  High blood pressure causes heart disease and increases the risk of stroke. This is more likely to develop in people who have high blood pressure readings, are of African descent, or are overweight.  Have your blood pressure checked: ? Every 3-5 years if you are 61-14 years of age. ? Every year if you are 59 years old or older. Diabetes Have regular diabetes screenings. This checks your fasting blood sugar level. Have the screening done:  Once every three years after age 71 if you are at a normal weight and have a low risk for diabetes.  More often and at a younger age if you are overweight or have a high risk for diabetes. What should I know about preventing infection? Hepatitis B If you have a higher risk for hepatitis B, you should be screened for this virus. Talk with your health care provider to find out if you are at risk for hepatitis B infection. Hepatitis C Testing is recommended for:  Everyone born from [birth year] through 1967.  Anyone with known risk factors for hepatitis C. Sexually transmitted infections (STIs)  Get screened for STIs, including gonorrhea and chlamydia, if: ? You are sexually active and are younger than 71 years of age. ? You are older than 71 years of age and your health care provider tells you that you are at risk for this type of infection. ? Your sexual activity has changed since you were last screened, and you  are at increased risk for chlamydia or gonorrhea. Ask your health care provider if you are at risk.  Ask your health care provider about whether you are at high risk for HIV. Your health care provider may recommend a prescription medicine to help prevent HIV infection. If you choose to take medicine to prevent HIV, you should first get tested for HIV. You should then be tested every 3 months for as long as you are taking the medicine. Pregnancy  If you are about to stop having your period (premenopausal) and you may become pregnant, seek counseling before you get pregnant.  Take 400 to 800 micrograms (mcg) of folic acid every day if you become pregnant.  Ask for birth control (contraception) if you want to prevent pregnancy. Osteoporosis and menopause Osteoporosis is a disease in which the bones lose minerals and strength with aging. This can result in bone fractures. If you are 88 years old or older, or if you are at risk for osteoporosis and fractures, ask your health care provider if you should:  Be screened for bone loss.  Take a calcium or vitamin D supplement to lower your risk of fractures.  Be given hormone replacement therapy (HRT) to treat symptoms of menopause. Follow these instructions at home: Lifestyle  Do not use any products that contain nicotine or tobacco, such as cigarettes, e-cigarettes, and chewing tobacco. If you need help quitting, ask your health care provider.  Do not use street drugs.  Do not share needles.  Ask your health care provider for help if you need support or information about quitting drugs. Alcohol use  Do not drink alcohol if: ? Your health care provider tells you not to drink. ? You are pregnant, may be pregnant, or are planning to become pregnant.  If you drink alcohol: ? Limit how much you use to 0-1 drink a day. ? Limit intake if you are breastfeeding.  Be aware of how much alcohol is in your drink. In the [place] one drink equals one 12  oz bottle of beer (355 mL), one 5 oz glass of wine (148 mL), or one 1 oz glass of hard liquor (44 mL). General instructions  Schedule regular health, dental, and eye exams.  Stay current with your vaccines.  Tell your health care provider if: ? You often feel depressed. ? You have ever been abused or do not feel safe at home. Summary  Adopting a healthy lifestyle and getting preventive care are important in promoting health and wellness.  Follow your health care provider's instructions about healthy diet, exercising, and getting tested or screened for diseases.  Follow your health care provider's instructions on monitoring your cholesterol and blood pressure. This information is not intended to replace advice given to you by your health care provider. Make sure you discuss any questions you have with your health care provider. Document Revised: 05/19/2018 Document Reviewed: 05/19/2018 Elsevier Patient Education  2020 [employer].

## 2019-07-12 NOTE — Progress Notes (Signed)
Subjective:    Patient ID: Brooke [name], female    DOB: 1951-01-08, 69 y.o.   MRN: [id number]  HPI She is here for a physical exam.   She denies any changes in her health and has no concerns.    Medications and allergies reviewed with patient and updated if appropriate.  Patient Active Problem List   Diagnosis Date Noted  . GERD (gastroesophageal reflux disease) 06/01/2019  . Hot flashes 10/08/2018  . Chest tightness 08/24/2018  . Pain of left heel 01/11/2018  . Sleep difficulties 04/15/2016  . Prediabetes 05/07/2015  . Memory difficulties 05/07/2015  . Bilateral leg edema 05/07/2015  . Anemia 10/12/2013  . FH: CAD (coronary artery disease) 04/27/2012  . OSTEOARTHRITIS 03/30/2008  . Hyperlipidemia 07/22/2007  . OBESITY NOS 02/22/2007  . Allergic rhinitis 02/22/2007  . Essential hypertension 02/18/2007    Current Outpatient Medications on File Prior to Visit  Medication Sig Dispense Refill  . amLODipine (NORVASC) 10 MG tablet Take 1 tablet (10 mg total) by mouth daily. Annual appt is due must see provider for future refills 30 tablet 0  . aspirin 81 MG chewable tablet Chew 81 mg by mouth daily.    Marland Kitchen atenolol (TENORMIN) 25 MG tablet TAKE 1 TABLET BY MOUTH ONCE DAILY . APPOINTMENT REQUIRED FOR FUTURE REFILLS 30 tablet 0  . atorvastatin (LIPITOR) 20 MG tablet TAKE 1 TABLET BY MOUTH ONCE DAILY . APPOINTMENT REQUIRED FOR FUTURE REFILLS 30 tablet 0  . [facility] BL[facility] COHOSH PO Take 1 tablet by mouth.    . famotidine (PEPCID) 40 MG tablet Take 1 tablet (40 mg total) by mouth daily. Annual appt is due must see provider for future refills 30 tablet 0  . ferrous sulfate 325 (65 FE) MG tablet Take 324 mg by mouth daily with breakfast.    . potassium chloride (KLOR-CON M10) 10 MEQ tablet Take 1 tablet (10 mEq total) by mouth three times daily. 90 tablet 0  . triamterene-hydrochlorothiazide (MAXZIDE-25) 37.5-25 MG tablet Take 1 tablet by mouth daily. Annual appt is due must see provider  for future refills 30 tablet 0   No current facility-administered medications on file prior to visit.    Past Medical History:  Diagnosis Date  . FH: CAD (coronary artery disease)   . High cholesterol   . Hypertension   . Obesity     Past Surgical History:  Procedure Laterality Date  . CARPAL TUNNEL RELEASE     bilateral  . HIP SURGERY      Social History   Socioeconomic History  . Marital status: Married    Spouse name: Not on file  . Number of children: 2  . Years of education: 12th   . Highest education level: Not on file  Occupational History  . Occupation: Psychologist, sport and exercise for [employer]    Comment: Laid Off  Tobacco Use  . Smoking status: Never Smoker  . Smokeless tobacco: Never Used  Substance and Sexual Activity  . Alcohol use: No  . Drug use: No  . Sexual activity: Not on file  Other Topics Concern  . 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        Social Determinants of Health   Financial Resource Strain:   . Difficulty of Paying Living Expenses: Not on file  Food Insecurity:   . Worried About Charity fundraiser in the Last Year: Not  on file  . Ran Out of Food in the Last Year: Not on file  Transportation Needs:   . Lack of Transportation (Medical): Not on file  . Lack of Transportation (Non-Medical): Not on file  Physical Activity:   . Days of Exercise per Week: Not on file  . Minutes of Exercise per Session: Not on file  Stress:   . Feeling of Stress : Not on file  Social Connections:   . Frequency of Communication with Friends and Family: Not on file  . Frequency of Social Gatherings with Friends and Family: Not on file  . Attends Religious Services: Not on file  . Active Member of Clubs or Organizations: Not on file  . Attends Archivist Meetings: Not on file  . Marital Status: Not on file    Family History  Problem Relation Age of Onset  . CAD  Sister   . CAD Father   . Kidney disease Sister   . Lung cancer Mother   . Brain cancer Mother     Review of Systems     Objective:   Vitals:   07/12/19 1402  BP: 140/72  Pulse: (!) 51  Resp: 16  Temp: 98.8 F (37.1 C)  SpO2: 98%   Filed Weights   07/12/19 1402  Weight: 182 lb 6.4 oz (82.7 kg)   Body mass index is 34.46 kg/m.  BP Readings from Last 3 Encounters:  07/12/19 140/72  06/04/19 (!) 144/57  06/01/19 128/70    Wt Readings from Last 3 Encounters:  07/12/19 182 lb 6.4 oz (82.7 kg)  06/01/19 183 lb (83 kg)  10/08/18 182 lb 12.8 oz (82.9 kg)     Physical Exam Constitutional: She appears well-developed and well-nourished. No distress.  HENT:  Head: Normocephalic and atraumatic.  Right Ear: External ear normal. Normal ear canal and TM Left Ear: External ear normal.  Normal ear canal and TM Mouth/Throat: Oropharynx is clear and moist.  Eyes: Conjunctivae and EOM are normal.  Neck: Neck supple. No tracheal deviation present. No thyromegaly present.  No carotid bruit  Cardiovascular: Normal rate, regular rhythm and normal heart sounds.   No murmur heard.  No edema. Pulmonary/Chest: Effort normal and breath sounds normal. No respiratory distress. She has no wheezes. She has no rales.  Breast: deferred   Abdominal: Soft. She exhibits no distension. There is no tenderness.  Lymphadenopathy: She has no cervical adenopathy.  Skin: Skin is warm and dry. She is not diaphoretic.  Psychiatric: She has a normal mood and affect. Her behavior is normal.        Assessment & Plan:   Physical exam: Screening blood work    ordered Immunizations  Discussed tdap, shingrix.  Had covid #1 Colonoscopy  Due, 03/2018 Mammogram  Due -  Will schedule Gyn  N/a   Dexa  Up to date Eye exams  Up to date  Exercise  Some weights, resistance bands. No cardio Weight  Encouraged weight loss Substance abuse   none  See Problem List for Assessment and Plan of chronic medical  problems.    This visit occurred during the SARS-CoV-2 public health emergency.  Safety protocols were in place, including screening questions prior to the visit, additional usage of staff PPE, and extensive cleaning of exam room while observing appropriate contact time as indicated for disinfecting solutions.

## 2019-07-12 NOTE — Assessment & Plan Note (Signed)
GERD intermittently Taking pepcid prn only - about once a month continue

## 2019-07-12 NOTE — Assessment & Plan Note (Addendum)
Encouraged increased exercise and weight loss

## 2019-07-12 NOTE — Assessment & Plan Note (Signed)
BP Readings from Last 3 Encounters:  07/12/19 140/72  06/04/19 (!) 144/57  06/01/19 128/70   Chronic BP well controlled Current regimen effective and well tolerated Continue current medications at current doses cmp

## 2019-07-12 NOTE — Assessment & Plan Note (Signed)
Chronic Check a1c Low sugar / carb diet Stressed regular exercise Encouraged weight loss 

## 2019-07-13 ENCOUNTER — Encounter: Payer: Self-pay | Admitting: Internal Medicine

## 2019-07-13 LAB — TSH: TSH: 2.03 u[IU]/mL (ref 0.35–4.50)

## 2019-07-15 DIAGNOSIS — H04213 Epiphora due to excess lacrimation, bilateral lacrimal glands: Secondary | ICD-10-CM | POA: Diagnosis not present

## 2019-07-15 DIAGNOSIS — H00025 Hordeolum internum left lower eyelid: Secondary | ICD-10-CM | POA: Diagnosis not present

## 2019-07-15 DIAGNOSIS — H04123 Dry eye syndrome of bilateral lacrimal glands: Secondary | ICD-10-CM | POA: Diagnosis not present

## 2019-07-16 ENCOUNTER — Other Ambulatory Visit: Payer: Self-pay | Admitting: Internal Medicine

## 2019-07-22 DIAGNOSIS — H04213 Epiphora due to excess lacrimation, bilateral lacrimal glands: Secondary | ICD-10-CM | POA: Diagnosis not present

## 2019-07-22 DIAGNOSIS — H00025 Hordeolum internum left lower eyelid: Secondary | ICD-10-CM | POA: Diagnosis not present

## 2019-07-22 DIAGNOSIS — H04123 Dry eye syndrome of bilateral lacrimal glands: Secondary | ICD-10-CM | POA: Diagnosis not present

## 2019-07-30 ENCOUNTER — Ambulatory Visit: Payer: Medicare Other

## 2019-08-01 ENCOUNTER — Other Ambulatory Visit: Payer: Self-pay | Admitting: Internal Medicine

## 2019-08-06 ENCOUNTER — Ambulatory Visit: Payer: Medicare Other | Attending: Internal Medicine

## 2019-08-06 DIAGNOSIS — Z23 Encounter for immunization: Secondary | ICD-10-CM

## 2019-08-06 NOTE — Progress Notes (Signed)
   Covid-19 Vaccination Clinic  Name:  Brooke [name]    MRN: [id number] DOB: 04-08-1951  08/06/2019  [name] was observed post Covid-19 immunization for 15 minutes without incidence. She was provided with Vaccine Information Sheet and instruction to access the V-Safe system.   [name] was instructed to call 911 with any severe reactions post vaccine: Marland Kitchen Difficulty breathing  . Swelling of your face and throat  . A fast heartbeat  . A bad rash all over your body  . Dizziness and weakness    Immunizations Administered    Name Date Dose VIS Date Route   Moderna COVID-19 Vaccine 08/06/2019 11:25 AM 0.5 mL 05/10/2019 Intramuscular   Manufacturer: Moderna   Lot: [id number]   [id number]

## 2019-08-26 ENCOUNTER — Other Ambulatory Visit: Payer: Self-pay | Admitting: Internal Medicine

## 2019-08-26 DIAGNOSIS — Z1231 Encounter for screening mammogram for malignant neoplasm of breast: Secondary | ICD-10-CM

## 2019-08-29 ENCOUNTER — Encounter: Payer: Self-pay | Admitting: Gastroenterology

## 2019-08-29 ENCOUNTER — Ambulatory Visit: Payer: Medicare Other | Admitting: Gastroenterology

## 2019-08-29 ENCOUNTER — Other Ambulatory Visit: Payer: Self-pay

## 2019-08-29 VITALS — BP 130/62 | HR 60 | Temp 99.1°F | Ht 60.5 in | Wt 175.0 lb

## 2019-08-29 DIAGNOSIS — Z1211 Encounter for screening for malignant neoplasm of colon: Secondary | ICD-10-CM | POA: Diagnosis not present

## 2019-08-29 NOTE — Progress Notes (Signed)
[facility] Consult Note:  History: Brooke [name] 08/29/2019  Referring provider: Binnie Rail, MD  Reason for consult/chief complaint: No chief complaint on file.   Subjective  HPI: Normal colonoscopy performed by Dr. 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 Brooke [name] was here with her husband Brooke [name], who is also my patient seen the same day.  Brooke [name] is due for screening colonoscopy.  She denies abdominal pain, altered bowel habits or rectal bleeding.  Denies family history of colorectal cancer. Her appetite is reportedly good, with no nausea, vomiting, dysphagia, odynophagia, early satiety or weight loss.  Takes occasional Pepcid for heartburn  ROS:  Review of Systems She denies chest pain, dyspnea or dysuria  Past Medical History: Past Medical History:  Diagnosis Date  . FH: CAD (coronary artery disease)   . High cholesterol   . Hypertension   . Memory loss   . Obesity      Past Surgical History: Past Surgical History:  Procedure Laterality Date  . CARPAL TUNNEL RELEASE     bilateral  . HIP SURGERY       Family History: Family History  Problem Relation Age of Onset  . CAD Sister   . CAD Father   . Kidney disease Sister   . Lung cancer Mother   . Brain cancer Mother   . Colon cancer Neg Hx   . Stomach cancer Neg Hx   . Pancreatic cancer Neg Hx   . Esophageal cancer Neg Hx     Social History: Social History   Socioeconomic History  . Marital status: Married    Spouse name: Not on file  . Number of children: 2  . Years of education: 12th   . Highest education level: Not on file  Occupational History  . Occupation: Psychologist, sport and exercise for [employer]    Comment: Laid Off  Tobacco Use  . Smoking status: Never Smoker  . Smokeless tobacco: Never Used  Substance and Sexual Activity  . Alcohol use: No  . Drug use: No  . Sexual activity: Not on file  Other Topics Concern  . Not on  file  Social History Narrative   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        Social Determinants of Health   Financial Resource Strain:   . Difficulty of Paying Living Expenses:   Food Insecurity:   . Worried About Charity fundraiser in the Last Year:   . Arboriculturist in the Last Year:   Transportation Needs:   . Film/video editor (Medical):   Marland Kitchen Lack of Transportation (Non-Medical):   Physical Activity:   . Days of Exercise per Week:   . Minutes of Exercise per Session:   Stress:   . Feeling of Stress :   Social Connections:   . Frequency of Communication with Friends and Family:   . Frequency of Social Gatherings with Friends and Family:   . Attends Religious Services:   . Active Member of Clubs or Organizations:   . Attends Archivist Meetings:   Marland Kitchen Marital Status:     Allergies: Allergies  Allergen Reactions  . Penicillins Hives    Outpatient Meds: Current Outpatient Medications  Medication Sig Dispense Refill  . amLODipine (NORVASC) 10 MG tablet Take 1 tablet (10 mg total) by mouth daily. Annual appt is due must see provider for future refills 30 tablet 0  . aspirin  81 MG chewable tablet Chew 81 mg by mouth daily.    Marland Kitchen atenolol (TENORMIN) 25 MG tablet TAKE 1 TABLET BY MOUTH ONCE DAILY . APPOINTMENT REQUIRED FOR FUTURE REFILLS 90 tablet 1  . atorvastatin (LIPITOR) 20 MG tablet Take 1 tablet by mouth daily. 90 tablet 1  . [facility] COHOSH PO Take 1 tablet by mouth. As needed    . famotidine (PEPCID) 40 MG tablet Take 1 tablet (40 mg total) by mouth daily. Annual appt is due must see provider for future refills 30 tablet 0  . ferrous sulfate 325 (65 FE) MG tablet Take 324 mg by mouth daily with breakfast.    . potassium chloride (KLOR-CON) 10 MEQ tablet TAKE 1 TABLET BY MOUTH THREE TIMES DAILY . 270 tablet 1  . triamterene-hydrochlorothiazide (MAXZIDE-25) 37.5-25 MG tablet Take 1 tablet by mouth daily. Annual appt is due  must see provider for future refills 30 tablet 0   No current facility-administered medications for this visit.      ___________________________________________________________________ Objective   Exam:  BP 130/62   Pulse 60   Temp 99.1 F (37.3 C)   Ht 5' 0.5" (1.537 m) Comment: height measured without shoes  Wt 175 lb (79.4 kg)   BMI 33.61 kg/m    General: Well-appearing  Eyes: sclera anicteric, no redness  ENT: oral mucosa moist without lesions, no cervical or supraclavicular lymphadenopathy  CV: RRR without murmur, S1/S2, no JVD, no peripheral edema  Resp: clear to auscultation bilaterally, normal RR and effort noted  GI: soft, no tenderness, with active bowel sounds. No guarding or palpable organomegaly noted.  Skin; warm and dry, no rash or jaundice noted  Neuro: awake, alert and oriented x 3. Normal gross motor function and fluent speech  Labs:  CBC Latest Ref Rng & Units 07/12/2019 10/08/2018 04/21/2018  WBC 4.0 - 10.5 K/uL 7.5 6.5 7.4  Hemoglobin 12.0 - 15.0 g/dL 11.8(L) 12.4 11.8(L)  Hematocrit 36.0 - 46.0 % 37.3 38.6 37.1  Platelets 150.0 - 400.0 K/uL 194.0 193.0 195.0   CMP Latest Ref Rng & Units 07/12/2019 10/08/2018 04/21/2018  Glucose 70 - 99 mg/dL 95 89 113(H)  BUN 6 - 23 mg/dL 13 16 21   Creatinine 0.40 - 1.20 mg/dL 1.05 0.89 1.14  Sodium 135 - 145 mEq/L 138 140 141  Potassium 3.5 - 5.1 mEq/L 3.9 3.7 3.4(L)  Chloride 96 - 112 mEq/L 103 102 104  CO2 19 - 32 mEq/L 28 28 30   Calcium 8.4 - 10.5 mg/dL 10.0 9.8 9.9  Total Protein 6.0 - 8.3 g/dL 7.2 7.6 7.5  Total Bilirubin 0.2 - 1.2 mg/dL 0.6 0.7 0.6  Alkaline Phos 39 - 117 U/L 81 93 96  AST 0 - 37 U/L 18 15 21   ALT 0 - 35 U/L 20 12 19    Last iron studies 10/2018:  Iron 70, TIBC 315 (22% Sat)  Ferritin 197   Assessment: Encounter Diagnosis  Name Primary?  . Special screening for malignant neoplasms, colon Yes    Average risk for colorectal cancer.  Plan:  Colonoscopy.  She is agreeable after  discussion of procedure and risks.  The benefits and risks of the planned procedure were described in detail with the patient or (when appropriate) their health care proxy.  Risks were outlined as including, but not limited to, bleeding, infection, perforation, adverse medication reaction leading to cardiac or pulmonary decompensation, pancreatitis (if ERCP).  The limitation of incomplete mucosal visualization was also discussed.  No guarantees or warranties  were given.   Thank you for the courtesy of this consult.  Please call me with any questions or concerns.  Nelida Meuse III  CC: Referring provider noted above

## 2019-08-29 NOTE — Patient Instructions (Signed)
If you are age 71 or older, your body mass index should be between 23-30. Your Body mass index is 35.04 kg/m. If this is out of the aforementioned range listed, please consider follow up with your Primary Care Provider.  If you are age 90 or younger, your body mass index should be between 19-25. Your Body mass index is 35.04 kg/m. If this is out of the aformentioned range listed, please consider follow up with your Primary Care Provider.   You have been scheduled for a colonoscopy. Please follow written instructions given to you at your visit today.  Please pick up your prep supplies at the pharmacy within the next 1-3 days. If you use inhalers (even only as needed), please bring them with you on the day of your procedure.   It was a pleasure to see you today!  Dr. Loletha Carrow

## 2019-09-21 ENCOUNTER — Other Ambulatory Visit: Payer: Self-pay

## 2019-09-21 ENCOUNTER — Ambulatory Visit
Admission: RE | Admit: 2019-09-21 | Discharge: 2019-09-21 | Disposition: A | Discharge status: Home / Self Care | Payer: Medicare Other | Source: Ambulatory Visit | Attending: Internal Medicine | Admitting: Internal Medicine

## 2019-09-21 DIAGNOSIS — Z1231 Encounter for screening mammogram for malignant neoplasm of breast: Secondary | ICD-10-CM

## 2019-10-07 ENCOUNTER — Encounter: Payer: Medicare Other | Admitting: Gastroenterology

## 2019-10-07 ENCOUNTER — Other Ambulatory Visit: Payer: Self-pay | Admitting: *Deleted

## 2019-10-07 ENCOUNTER — Telehealth: Payer: Self-pay | Admitting: *Deleted

## 2019-10-07 NOTE — Telephone Encounter (Signed)
Pt. On schedule for today but paper work shows pt. Scheduled for 10/10/19 call placed concerning the appointment times after speaking with Dr. Estil Daft stated that he would be willing to do pt. At later time on Monday,pt. Offered to schedule appointment @ 4 pm on Monday 10/10/19,pt. Stated I will have to check with my grandaughter and I will call you back.

## 2019-10-07 NOTE — Telephone Encounter (Signed)
Received call from pt. She will come in Monday for procedure @ 4p.m. went over prep instructions from beginning to end with new times pt. Verbalized that she understands and made sure pt. Had on call number to call if she has any problems.

## 2019-10-08 ENCOUNTER — Other Ambulatory Visit: Payer: Self-pay | Admitting: Internal Medicine

## 2019-10-10 ENCOUNTER — Encounter: Payer: Self-pay | Admitting: Gastroenterology

## 2019-10-10 ENCOUNTER — Ambulatory Visit (AMBULATORY_SURGERY_CENTER): Payer: Medicare Other | Admitting: Gastroenterology

## 2019-10-10 ENCOUNTER — Other Ambulatory Visit: Payer: Self-pay

## 2019-10-10 VITALS — BP 114/51 | HR 41 | Temp 96.2°F | Resp 21 | Ht 60.5 in | Wt 175.0 lb

## 2019-10-10 DIAGNOSIS — Z1211 Encounter for screening for malignant neoplasm of colon: Secondary | ICD-10-CM | POA: Diagnosis not present

## 2019-10-10 DIAGNOSIS — D123 Benign neoplasm of transverse colon: Secondary | ICD-10-CM

## 2019-10-10 MED ORDER — SODIUM CHLORIDE 0.9 % IV SOLN: 500.0000 mL | Freq: Once | INTRAVENOUS | Status: DC

## 2019-10-10 NOTE — Progress Notes (Signed)
To PACU, VSS. Report to [email or web address].tb 

## 2019-10-10 NOTE — Progress Notes (Signed)
Called to room to assist during endoscopic procedure.  Patient ID and intended procedure confirmed with present staff. Received instructions for my participation in the procedure from the performing physician.  

## 2019-10-10 NOTE — Progress Notes (Signed)
Temp-LB VS-CW 

## 2019-10-10 NOTE — Patient Instructions (Signed)
HANDOUTS PROVIDED ON: POLYPS  The polyp removed today have been sent for pathology.  The results can take 1-3 weeks to receive.    You may resume your previous diet and medication schedule.  Thank you for allowing [place] to care for you today!!!   YOU HAD AN ENDOSCOPIC PROCEDURE TODAY AT [facility]:   Refer to the procedure report that was given to you for any specific questions about what was found during the examination.  If the procedure report does not answer your questions, please call your gastroenterologist to clarify.  If you requested that your care partner not be given the details of your procedure findings, then the procedure report has been included in a sealed envelope for you to review at your convenience later.  YOU SHOULD EXPECT: Some feelings of bloating in the abdomen. Passage of more gas than usual.  Walking can help get rid of the air that was put into your GI tract during the procedure and reduce the bloating. If you had a lower endoscopy (such as a colonoscopy or flexible sigmoidoscopy) you may notice spotting of blood in your stool or on the toilet paper. If you underwent a bowel prep for your procedure, you may not have a normal bowel movement for a few days.  Please Note:  You might notice some irritation and congestion in your nose or some drainage.  This is from the oxygen used during your procedure.  There is no need for concern and it should clear up in a day or so.  SYMPTOMS TO REPORT IMMEDIATELY:   Following lower endoscopy (colonoscopy or flexible sigmoidoscopy):  Excessive amounts of blood in the stool  Significant tenderness or worsening of abdominal pains  Swelling of the abdomen that is new, acute  Fever of 100F or higher  For urgent or emergent issues, a gastroenterologist can be reached at any hour by calling [phone]. Do not use MyChart messaging for urgent concerns.    DIET:  We do recommend a small meal at first, but then you  may proceed to your regular diet.  Drink plenty of fluids but you should avoid alcoholic beverages for 24 hours.  ACTIVITY:  You should plan to take it easy for the rest of today and you should NOT DRIVE or use heavy machinery until tomorrow (because of the sedation medicines used during the test).    FOLLOW UP: Our staff will call the number listed on your records 48-72 hours following your procedure to check on you and address any questions or concerns that you may have regarding the information given to you following your procedure. If we do not reach you, we will leave a message.  We will attempt to reach you two times.  During this call, we will ask if you have developed any symptoms of COVID 19. If you develop any symptoms (ie: fever, flu-like symptoms, shortness of breath, cough etc.) before then, please call [phone].  If you test positive for Covid 19 in the 2 weeks post procedure, please call and report this information to [place].    If any biopsies were taken you will be contacted by phone or by letter within the next 1-3 weeks.  Please call us at [phone] if you have not heard about the biopsies in 3 weeks.    SIGNATURES/CONFIDENTIALITY: You and/or your care partner have signed paperwork which will be entered into your electronic medical record.  These signatures attest to the fact that that the  information above on your After Visit Summary has been reviewed and is understood.  Full responsibility of the confidentiality of this discharge information lies with you and/or your care-partner.

## 2019-10-10 NOTE — Op Note (Signed)
[facility] Patient Name: Brooke [name] Procedure Date: 10/10/2019 3:00 PM MRN: [id number] Endoscopist: [place]. Loletha Carrow , MD Age: 69 Referring MD:  Date of Birth: 1951/02/15 Gender: Female Account #: [id number] Procedure:                Colonoscopy Indications:              Screening for colorectal malignant neoplasm (last                            colonoscopy 2009) Medicines:                Monitored Anesthesia Care Procedure:                Pre-Anesthesia Assessment:                           - Prior to the procedure, a History and Physical                            was performed, and patient medications and                            allergies were reviewed. The patient's tolerance of                            previous anesthesia was also reviewed. The risks                            and benefits of the procedure and the sedation                            options and risks were discussed with the patient.                            All questions were answered, and informed consent                            was obtained. Prior Anticoagulants: The patient has                            taken no previous anticoagulant or antiplatelet                            agents except for aspirin. ASA Grade Assessment:                            III - A patient with severe systemic disease. After                            reviewing the risks and benefits, the patient was                            deemed in satisfactory condition to undergo the  procedure.                           After obtaining informed consent, the colonoscope                            was passed under direct vision. Throughout the                            procedure, the patient's blood pressure, pulse, and                            oxygen saturations were monitored continuously. The                            Colonoscope was introduced through the anus and        advanced to the the cecum, identified by                            appendiceal orifice and ileocecal valve. The                            colonoscopy was performed without difficulty. The                            patient tolerated the procedure well. The quality                            of the bowel preparation was excellent. The                            ileocecal valve, appendiceal orifice, and rectum                            were photographed. The bowel preparation used was                            Miralax. Scope In: 3:37:27 PM Scope Out: 3:50:30 PM Scope Withdrawal Time: 0 hours 9 minutes 52 seconds  Total Procedure Duration: 0 hours 13 minutes 3 seconds  Findings:                 The perianal and digital rectal examinations were                            normal.                           A diminutive polyp was found in the proximal                            transverse colon. The polyp was sessile. The polyp                            was removed with a cold snare. Resection and  retrieval were complete.                           Retroflexion in the rectum was not performed due to                            narrow anatomy.                           The exam was otherwise without abnormality. Complications:            No immediate complications. Estimated Blood Loss:     Estimated blood loss was minimal. Impression:               - One diminutive polyp in the proximal transverse                            colon, removed with a cold snare. Resected and                            retrieved.                           - The examination was otherwise normal. Recommendation:           - Patient has a contact number available for                            emergencies. The signs and symptoms of potential                            delayed complications were discussed with the                            patient. Return to normal activities  tomorrow.                            Written discharge instructions were provided to the                            patient.                           - Resume previous diet.                           - Continue present medications.                           - Await pathology results.                           - Based on current guidelines and personal history,                            no repeat surveillance colonoscopy. Eusebio Blazejewski L. Loletha Carrow, MD 10/10/2019 3:55:29 PM This report has been signed electronically.

## 2019-10-12 ENCOUNTER — Telehealth: Payer: Self-pay | Admitting: *Deleted

## 2019-10-12 NOTE — Telephone Encounter (Signed)
1. Have you developed a fever since your procedure? no  2.   Have you had an respiratory symptoms (SOB or cough) since your procedure? no  3.   Have you tested positive for COVID 19 since your procedure no  4.   Have you had any family members/close contacts diagnosed with the COVID 19 since your procedure?  no   If yes to any of these questions please route to Joylene John, RN and Erenest Rasher, RN Follow up Call-  Call back number 10/10/2019  Post procedure Call Back phone  # [phone]  Permission to leave phone message Yes  Some recent data might be hidden     Patient questions:  Do you have a fever, pain , or abdominal swelling? No. Pain Score  0 *  Have you tolerated food without any problems? Yes.    Have you been able to return to your normal activities? Yes.    Do you have any questions about your discharge instructions: Diet   No. Medications  No. Follow up visit  No.  Do you have questions or concerns about your Care? No.  Actions: * If pain score is 4 or above: No action needed, pain <4.

## 2019-10-14 ENCOUNTER — Encounter: Payer: Self-pay | Admitting: Gastroenterology

## 2019-11-01 ENCOUNTER — Other Ambulatory Visit: Payer: Self-pay | Admitting: Internal Medicine

## 2019-12-05 ENCOUNTER — Encounter: Payer: Self-pay | Admitting: Internal Medicine

## 2019-12-05 ENCOUNTER — Ambulatory Visit (INDEPENDENT_AMBULATORY_CARE_PROVIDER_SITE_OTHER): Payer: Medicare Other | Admitting: Internal Medicine

## 2019-12-05 ENCOUNTER — Other Ambulatory Visit: Payer: Self-pay

## 2019-12-05 DIAGNOSIS — R21 Rash and other nonspecific skin eruption: Secondary | ICD-10-CM | POA: Insufficient documentation

## 2019-12-05 MED ORDER — CLOTRIMAZOLE-BETAMETHASONE 1-0.05 % EX CREA: 1.0000 "application " | TOPICAL_CREAM | Freq: Two times a day (BID) | CUTANEOUS | 0 refills | Status: DC

## 2019-12-05 NOTE — Patient Instructions (Addendum)
The cream on the rash twice daily.     Please call if there is no improvement in your symptoms.

## 2019-12-05 NOTE — Progress Notes (Signed)
Subjective:    Patient ID: Brooke [name], female    DOB: [birth year], 69 y.o.   MRN: [id number]  HPI The patient is here for an acute visit.   ? Rash of stomach:  Last week she was working outside in her garden. It was hot out and she was sweating a lot.  She showered after.  She used a lotion from bath and body works after and just put some on her stomach. At night her stomach was itching and mild pain and noticed bumps on her abdomen.   First it was just bumps, then they dried up and she was left with marks.    The itches at times, but it is intermittent.  She is concerned if it will go away.   Medications and allergies reviewed with patient and updated if appropriate.  Patient Active Problem List   Diagnosis Date Noted  . GERD (gastroesophageal reflux disease) 06/01/2019  . Hot flashes 10/08/2018  . Chest tightness 08/24/2018  . Pain of left heel 01/11/2018  . Prediabetes 05/07/2015  . Memory difficulties 05/07/2015  . Bilateral leg edema 05/07/2015  . Anemia 10/12/2013  . FH: CAD (coronary artery disease) 04/27/2012  . OSTEOARTHRITIS 03/30/2008  . Hyperlipidemia 07/22/2007  . OBESITY NOS 02/22/2007  . Allergic rhinitis 02/22/2007  . Essential hypertension 02/18/2007    Current Outpatient Medications on File Prior to Visit  Medication Sig Dispense Refill  . amLODipine (NORVASC) 10 MG tablet Take 1 tablet by mouth once daily 90 tablet 0  . aspirin 81 MG chewable tablet Chew 81 mg by mouth daily.    Marland Kitchen atenolol (TENORMIN) 25 MG tablet TAKE 1 TABLET BY MOUTH ONCE DAILY . APPOINTMENT REQUIRED FOR FUTURE REFILLS 90 tablet 1  . atorvastatin (LIPITOR) 20 MG tablet Take 1 tablet by mouth daily. 90 tablet 1  . [facility] BL[facility] COHOSH PO Take 1 tablet by mouth. As needed    . famotidine (PEPCID) 40 MG tablet Take 1 tablet (40 mg total) by mouth daily. Annual appt is due must see provider for future refills 30 tablet 0  . ferrous sulfate 325 (65 FE) MG tablet Take 324 mg by mouth daily  with breakfast.    . potassium chloride (KLOR-CON) 10 MEQ tablet TAKE 1 TABLET BY MOUTH THREE TIMES DAILY . 270 tablet 1  . traZODone (DESYREL) 50 MG tablet Take 50 mg by mouth as directed.    . triamterene-hydrochlorothiazide (MAXZIDE-25) 37.5-25 MG tablet Take 1 tablet by mouth once daily 90 tablet 0  . [DISCONTINUED] potassium chloride (KLOR-CON M10) 10 MEQ tablet Take 1 tablet (10 mEq total) by mouth three times daily. 90 tablet 0   No current facility-administered medications on file prior to visit.    Past Medical History:  Diagnosis Date  . FH: CAD (coronary artery disease)   . High cholesterol   . Hypertension   . Memory loss   . Obesity     Past Surgical History:  Procedure Laterality Date  . CARPAL TUNNEL RELEASE     bilateral  . HIP SURGERY      Social History   Socioeconomic History  . Marital status: Married    Spouse name: Not on file  . Number of children: 2  . Years of education: 12th   . Highest education level: Not on file  Occupational History  . Occupation: Psychologist, sport and exercise for [employer]    Comment: Laid Off  Tobacco Use  . Smoking status: Never Smoker  .  Smokeless tobacco: Never Used  Substance and Sexual Activity  . Alcohol use: No  . Drug use: No  . Sexual activity: Not on file  Other Topics Concern  . 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        Social Determinants of Health   Financial Resource Strain:   . Difficulty of Paying Living Expenses:   Food Insecurity:   . Worried About Charity fundraiser in the Last Year:   . Arboriculturist in the Last Year:   Transportation Needs:   . Film/video editor (Medical):   Marland Kitchen Lack of Transportation (Non-Medical):   Physical Activity:   . Days of Exercise per Week:   . Minutes of Exercise per Session:   Stress:   . Feeling of Stress :   Social Connections:   . Frequency of Communication with Friends and  Family:   . Frequency of Social Gatherings with Friends and Family:   . Attends Religious Services:   . Active Member of Clubs or Organizations:   . Attends Archivist Meetings:   Marland Kitchen Marital Status:     Family History  Problem Relation Age of Onset  . CAD Sister   . CAD Father   . Kidney disease Sister   . Lung cancer Mother   . Brain cancer Mother   . Colon cancer Neg Hx   . Stomach cancer Neg Hx   . Pancreatic cancer Neg Hx   . Esophageal cancer Neg Hx   . Colon polyps Neg Hx   . Rectal cancer Neg Hx     Review of Systems  Constitutional: Negative for chills and fever.  Skin: Positive for rash.       Objective:   Vitals:   12/05/19 1430  BP: 134/78  Pulse: 67  Temp: 98 F (36.7 C)  SpO2: 98%   BP Readings from Last 3 Encounters:  12/05/19 134/78  10/10/19 (!) 114/51  08/29/19 130/62   Wt Readings from Last 3 Encounters:  12/05/19 173 lb (78.5 kg)  10/10/19 175 lb (79.4 kg)  08/29/19 175 lb (79.4 kg)   Body mass index is 33.23 kg/m.   Physical Exam Constitutional:      General: She is not in acute distress.    Appearance: Normal appearance. She is not ill-appearing.  Skin:    General: Skin is warm and dry.     Findings: Rash (b/l anterior mid abdomen - dried rash with areas of erythema - irregular patchy, no discrete papules or blister.  no rash elsewhere) present.  Neurological:     Mental Status: She is alert.            Assessment & Plan:    See Problem List for Assessment and Plan of chronic medical problems.    This visit occurred during the SARS-CoV-2 public health emergency.  Safety protocols were in place, including screening questions prior to the visit, additional usage of staff PPE, and extensive cleaning of exam room while observing appropriate contact time as indicated for disinfecting solutions.

## 2019-12-05 NOTE — Assessment & Plan Note (Signed)
Acute Likely heat rash that got worse after putting lotion on area, not a typical yeast infection and not shingles Mild itching, discomfort Will treat with lotrisone She will call if there is no improvement

## 2019-12-06 ENCOUNTER — Telehealth: Payer: Self-pay

## 2019-12-06 NOTE — Telephone Encounter (Signed)
Patient calling and states that she just received something in MyChart from Dr Quay Burow, but is locked out of her account. Would like to know if her assistant could call back and inform her of the information? Please advise.

## 2019-12-08 NOTE — Telephone Encounter (Signed)
I did not send anything - ? From [facility]?

## 2019-12-08 NOTE — Telephone Encounter (Signed)
Spoke with patient.

## 2019-12-14 ENCOUNTER — Telehealth: Payer: Self-pay | Admitting: Internal Medicine

## 2019-12-14 NOTE — Progress Notes (Signed)
°  Chronic Care Management   Outreach Note  12/14/2019 Name: Brooke [name] MRN: [id number] DOB: May 16, 1951  Referred by: Binnie Rail, MD Reason for referral : No chief complaint on file.   An unsuccessful telephone outreach was attempted today. The patient was referred to the pharmacist for assistance with care management and care coordination. This note is not being shared with the patient for the following reason: To respect privacy (The patient or proxy has requested that the information not be shared).  Follow Up Plan:   Earney Hamburg [facility] Sched[facility]

## 2020-01-19 ENCOUNTER — Telehealth: Payer: Self-pay | Admitting: Internal Medicine

## 2020-01-19 NOTE — Progress Notes (Signed)
  Chronic Care Management   Note  01/19/2020 Name: Brooke [name] MRN: [id number] DOB: 1950-09-07  Brooke [name] is a 69 y.o. year old female who is a primary care patient of Burns, Claudina Lick, MD. I reached out to [name] by phone today in response to a referral sent by [name]'s PCP, Binnie Rail, MD.   [name] was given information about Chronic Care Management services today including:  1. CCM service includes personalized support from designated clinical staff supervised by her physician, including individualized plan of care and coordination with other care providers 2. 24/7 contact phone numbers for assistance for urgent and routine care needs. 3. Service will only be billed when office clinical staff spend 20 minutes or more in a month to coordinate care. 4. Only one practitioner may furnish and bill the service in a calendar month. 5. The patient may stop CCM services at any time (effective at the end of the month) by phone call to the office staff.   Patient agreed to services and verbal consent obtained.   Follow up plan:   Earney Hamburg [facility]

## 2020-01-20 ENCOUNTER — Other Ambulatory Visit: Payer: Self-pay | Admitting: Internal Medicine

## 2020-02-04 ENCOUNTER — Other Ambulatory Visit: Payer: Self-pay | Admitting: Internal Medicine

## 2020-03-12 DIAGNOSIS — H43812 Vitreous degeneration, left eye: Secondary | ICD-10-CM | POA: Diagnosis not present

## 2020-03-13 ENCOUNTER — Telehealth: Payer: Self-pay | Admitting: Pharmacist

## 2020-03-13 ENCOUNTER — Telehealth: Payer: Medicare Other

## 2020-03-13 NOTE — Progress Notes (Signed)
    Chronic Care Management Pharmacy Assistant   Name: Brooke [name]  MRN: [id number] DOB: 04-30-1951  Reason for Encounter: Initial Questions    Brooke [name],  69 y.o. , female presents for their Initial CCM visit with the clinical pharmacist via telephone.  PCP : Binnie Rail, MD  Allergies:   Allergies  Allergen Reactions  . Penicillins Hives    Medications: Outpatient Encounter Medications as of 03/13/2020  Medication Sig  . amLODipine (NORVASC) 10 MG tablet Take 1 tablet by mouth once daily  . aspirin 81 MG chewable tablet Chew 81 mg by mouth daily.  Marland Kitchen atenolol (TENORMIN) 25 MG tablet Take 1 tablet (25 mg total) by mouth daily.  Marland Kitchen atorvastatin (LIPITOR) 20 MG tablet Take 1 tablet by mouth once daily  . clotrimazole-betamethasone (LOTRISONE) cream Apply 1 application topically 2 (two) times daily.  . [facility] COHOSH PO Take 1 tablet by mouth. As needed  . famotidine (PEPCID) 40 MG tablet Take 1 tablet (40 mg total) by mouth daily. Annual appt is due must see provider for future refills  . ferrous sulfate 325 (65 FE) MG tablet Take 324 mg by mouth daily with breakfast.  . potassium chloride (KLOR-CON) 10 MEQ tablet TAKE 1 TABLET BY MOUTH THREE TIMES DAILY .  Marland Kitchen traZODone (DESYREL) 50 MG tablet Take 50 mg by mouth as directed.  . triamterene-hydrochlorothiazide (MAXZIDE-25) 37.5-25 MG tablet Take 1 tablet by mouth once daily  . [DISCONTINUED] potassium chloride (KLOR-CON M10) 10 MEQ tablet Take 1 tablet (10 mEq total) by mouth three times daily.   No facility-administered encounter medications on file as of 03/13/2020.    Current Diagnosis: Patient Active Problem List   Diagnosis Date Noted  . Rash and nonspecific skin eruption 12/05/2019  . GERD (gastroesophageal reflux disease) 06/01/2019  . Hot flashes 10/08/2018  . Chest tightness 08/24/2018  . Pain of left heel 01/11/2018  . Prediabetes 05/07/2015  . Memory difficulties 05/07/2015  . Bilateral leg edema  05/07/2015  . Anemia 10/12/2013  . FH: CAD (coronary artery disease) 04/27/2012  . OSTEOARTHRITIS 03/30/2008  . Hyperlipidemia 07/22/2007  . OBESITY NOS 02/22/2007  . Allergic rhinitis 02/22/2007  . Essential hypertension 02/18/2007    Goals Addressed   None     Follow-Up:  Pharmacist Review    Have you seen any other providers since your last visit? no Any changes in your medications or health? no Any side effects from any medications? no Do you have an symptoms or problems not managed by your medications? no Any concerns about your health right now? no Has your provider asked that you check blood pressure, blood sugar, or follow special diet at home? no Do you get any type of exercise on a regular basis? yes Can you think of a goal you would like to reach for your health? None  Do you have any problems getting your medications? no Is there anything that you would like to discuss during the appointment? None   Please bring medications and supplements to appointment   Rosendo Gros, [facility]  Practice Team Manager/ CPA (Clinical Pharmacist Assistant) [phone]

## 2020-03-14 ENCOUNTER — Telehealth: Payer: Self-pay | Admitting: Pharmacist

## 2020-03-14 ENCOUNTER — Other Ambulatory Visit: Payer: Self-pay

## 2020-03-14 ENCOUNTER — Ambulatory Visit: Payer: Medicare Other | Admitting: Pharmacist

## 2020-03-14 DIAGNOSIS — R7303 Prediabetes: Secondary | ICD-10-CM

## 2020-03-14 DIAGNOSIS — I10 Essential (primary) hypertension: Secondary | ICD-10-CM

## 2020-03-14 DIAGNOSIS — E7849 Other hyperlipidemia: Secondary | ICD-10-CM

## 2020-03-14 NOTE — Progress Notes (Signed)
    Chronic Care Management Pharmacy Assistant   Name: Brooke [name]  MRN: [id number] DOB: August 18, 1950  Reason for Encounter: Enrolling patient for Dispensing services   PCP : Binnie Rail, MD  Allergies:   Allergies  Allergen Reactions  . Penicillins Hives    Medications: Outpatient Encounter Medications as of 03/14/2020  Medication Sig  . amLODipine (NORVASC) 10 MG tablet Take 1 tablet by mouth once daily  . aspirin 81 MG chewable tablet Chew 81 mg by mouth daily.  Marland Kitchen atenolol (TENORMIN) 25 MG tablet Take 1 tablet (25 mg total) by mouth daily.  Marland Kitchen atorvastatin (LIPITOR) 20 MG tablet Take 1 tablet by mouth once daily  . clotrimazole-betamethasone (LOTRISONE) cream Apply 1 application topically 2 (two) times daily. (Patient not taking: Reported on 03/14/2020)  . [facility] BL[facility] COHOSH PO Take 1 tablet by mouth. As needed (Patient not taking: Reported on 03/14/2020)  . famotidine (PEPCID) 40 MG tablet Take 1 tablet (40 mg total) by mouth daily. Annual appt is due must see provider for future refills (Patient not taking: Reported on 03/14/2020)  . ferrous sulfate 325 (65 FE) MG tablet Take 324 mg by mouth daily with breakfast. (Patient not taking: Reported on 03/14/2020)  . potassium chloride (KLOR-CON) 10 MEQ tablet TAKE 1 TABLET BY MOUTH THREE TIMES DAILY . (Patient not taking: Reported on 03/14/2020)  . traZODone (DESYREL) 50 MG tablet Take 50 mg by mouth as directed.  . triamterene-hydrochlorothiazide (MAXZIDE-25) 37.5-25 MG tablet Take 1 tablet by mouth once daily  . [DISCONTINUED] potassium chloride (KLOR-CON M10) 10 MEQ tablet Take 1 tablet (10 mEq total) by mouth three times daily.   No facility-administered encounter medications on file as of 03/14/2020.    Current Diagnosis: Patient Active Problem List   Diagnosis Date Noted  . Rash and nonspecific skin eruption 12/05/2019  . GERD (gastroesophageal reflux disease) 06/01/2019  . Hot flashes 10/08/2018  . Chest tightness 08/24/2018   . Pain of left heel 01/11/2018  . Prediabetes 05/07/2015  . Memory difficulties 05/07/2015  . Bilateral leg edema 05/07/2015  . Anemia 10/12/2013  . FH: CAD (coronary artery disease) 04/27/2012  . OSTEOARTHRITIS 03/30/2008  . Hyperlipidemia 07/22/2007  . OBESITY NOS 02/22/2007  . Allergic rhinitis 02/22/2007  . Essential hypertension 02/18/2007    Goals Addressed   None     Follow-Up:  Pharmacist Review    The patient had an initial televisit with Clinical pharmacist Charlene Brooke on 03/14/2020. Upon completion of the visit the patient has agreed to try [facility] services for their dispensing and delivery of medications. Per clinical pharmacist request  I completed an on-boarding form with the list of the patients medications, current pharmacy, demographics, allergies and insurance information. The form was then forward to Clinical pharmacist for review.     Rosendo Gros, [facility]  Practice Team Manager/ CPA (Clinical Pharmacist Assistant) [phone]

## 2020-03-14 NOTE — Chronic Care Management (AMB) (Signed)
Chronic Care Management Pharmacy  Name: Brooke [name]  MRN: [id number] DOB: 09-Aug-1950   Chief Complaint/ HPI  Brooke [name],  69 y.o. , female presents for their Initial CCM visit with the clinical pharmacist via telephone due to COVID-19 Pandemic.  PCP : Brooke Rail, MD Patient Care Team: Brooke Rail, MD as PCP - General (Internal Medicine) Brooke [name], [facility] as Pharmacist (Pharmacist)  Their chronic conditions include: Hypertension, Hyperlipidemia, GERD, Osteoarthritis, Allergic Rhinitis and Prediabetes, Anemia, Insomnia  Lives with husband who is a dialysis patient. Cares for her granddaughter and great-granddaughter occasionally.  Office Visits: 12/05/19 Dr Brooke [name] OV: acute visit for rash on stomach. Rx'd Lotrisone cream  07/12/19 Dr Brooke [name] OV: chronic f/u, labs stable, no med changes.  Consult Visit: 10/10/19 colonoscopy 08/29/19 Dr Brooke [name] (GI): no GI complaints, scheduled for routine colonoscopy.  Allergies  Allergen Reactions  . Penicillins Hives    Medications: Outpatient Encounter Medications as of 03/14/2020  Medication Sig  . amLODipine (NORVASC) 10 MG tablet Take 1 tablet by mouth once daily  . aspirin 81 MG chewable tablet Chew 81 mg by mouth daily.  Marland Kitchen atenolol (TENORMIN) 25 MG tablet Take 1 tablet (25 mg total) by mouth daily.  Marland Kitchen atorvastatin (LIPITOR) 20 MG tablet Take 1 tablet by mouth once daily  . traZODone (DESYREL) 50 MG tablet Take 50 mg by mouth as directed.  . triamterene-hydrochlorothiazide (MAXZIDE-25) 37.5-25 MG tablet Take 1 tablet by mouth once daily  . clotrimazole-betamethasone (LOTRISONE) cream Apply 1 application topically 2 (two) times daily. (Patient not taking: Reported on 03/14/2020)  . [facility] BLACK COHOSH PO Take 1 tablet by mouth. As needed (Patient not taking: Reported on 03/14/2020)  . famotidine (PEPCID) 40 MG tablet Take 1 tablet (40 mg total) by mouth daily. Annual appt is due must see provider for future refills (Patient  not taking: Reported on 03/14/2020)  . ferrous sulfate 325 (65 FE) MG tablet Take 324 mg by mouth daily with breakfast. (Patient not taking: Reported on 03/14/2020)  . potassium chloride (KLOR-CON) 10 MEQ tablet TAKE 1 TABLET BY MOUTH THREE TIMES DAILY . (Patient not taking: Reported on 03/14/2020)  . [DISCONTINUED] potassium chloride (KLOR-CON M10) 10 MEQ tablet Take 1 tablet (10 mEq total) by mouth three times daily.   No facility-administered encounter medications on file as of 03/14/2020.    Wt Readings from Last 3 Encounters:  12/05/19 173 lb (78.5 kg)  10/10/19 175 lb (79.4 kg)  08/29/19 175 lb (79.4 kg)    Current Diagnosis/Assessment:  SDOH Interventions     Most Recent Value  SDOH Interventions  Financial Strain Interventions Intervention Not Indicated      Goals Addressed            This Visit's Progress   . Pharmacy Care Plan       CARE PLAN ENTRY (see longitudinal plan of care for additional care plan information)  Current Barriers:  . Chronic Disease Management support, education, and care coordination needs related to Hypertension and Hyperlipidemia   Hypertension BP Readings from Last 3 Encounters:  12/05/19 134/78  10/10/19 (!) 114/51  08/29/19 130/62 .  Pharmacist Clinical Goal(s): o Over the next 180 days, patient will work with PharmD and providers to maintain BP goal <130/80 . Current regimen:  o Amlodipine 10 mg daily o Atenolol 25 mg daily o Triamterene-HCTZ 37.5-25 mg daily . Interventions: o Discussed BP goals and benefits of medications for prevention of heart attack / stroke . Patient  self care activities - Over the next 180 days, patient will: o Check BP weekly, document, and provide at future appointments o Ensure daily salt intake < 2300 mg/day  Hyperlipidemia Lab Results  Component Value Date/Time   LDLCALC 71 07/12/2019 02:29 PM   LDLDIRECT 162.0 08/05/2012 09:41 AM .  Pharmacist Clinical Goal(s): o Over the next 180 days, patient  will work with PharmD and providers to maintain LDL goal < 100 . Current regimen:  o Atorvastatin 20 mg daily o Aspirin 81 mg daily . Interventions: o Discussed cholesterol goals and benefits of medications for prevention of heart attack / stroke . Patient self care activities - Over the next 180 days, patient will: o Continue current medications and lifestyle  Prediabetes Lab Results  Component Value Date/Time   HGBA1C 6.2 07/12/2019 02:29 PM   HGBA1C 6.1 10/08/2018 09:21 AM .  Pharmacist Clinical Goal(s): o Over the next 180 days, patient will work with PharmD and providers to maintain A1c goal <6.5% . Current regimen:  o No medications  . Interventions: o Discussed blood sugar goals and benefits of healthy diet and exercise to prevent progression to diabetes . Patient self care activities - Over the next 180 days, patient will: o Continue healthy diet and exercise routine  Medication management . Pharmacist Clinical Goal(s): o Over the next 180 days, patient will work with PharmD and providers to achieve optimal medication adherence . Current pharmacy: [facility] . Interventions o Comprehensive medication review performed. o Utilize [facility] for medication synchronization, packaging and delivery . Patient self care activities - Over the next 180 days, patient will: o Focus on medication adherence by pill pack o Take medications as prescribed o Report any questions or concerns to PharmD and/or provider(s)  Initial goal documentation       Hypertension   BP goal is:  <130/80  Office blood pressures are  BP Readings from Last 3 Encounters:  12/05/19 134/78  10/10/19 (!) 114/51  08/29/19 130/62   Kidney Function Lab Results  Component Value Date/Time   CREATININE 1.05 07/12/2019 02:29 PM   CREATININE 0.89 10/08/2018 09:21 AM   GFR 62.65 07/12/2019 02:29 PM   GFRNONAA 88 (L) 04/27/2012 12:13 PM   GFRAA >90 04/27/2012 12:13 PM   K 3.9 07/12/2019 02:29 PM     K 3.7 10/08/2018 09:21 AM   Patient checks BP at home infrequently Patient home BP readings are ranging: n/a  Patient has failed these meds in the past: n/a Patient is currently controlled on the following medications:  . Amlodipine 10 mg daily AM . Atenolol 25 mg daily . Triamterene-HCTZ 37.5-25 mg daily   We discussed diet and exercise extensively; BP goals; benefits of medications; pt denies side effects  Plan  Continue current medications    Hyperlipidemia   LDL goal < 100  Lipid Panel     Component Value Date/Time   CHOL 145 07/12/2019 1429   TRIG 100.0 07/12/2019 1429   HDL 54.50 07/12/2019 1429   LDLCALC 71 07/12/2019 1429   LDLDIRECT 162.0 08/05/2012 0941    Hepatic Function Latest Ref Rng & Units 07/12/2019 10/08/2018 04/21/2018  Total Protein 6.0 - 8.3 g/dL 7.2 7.6 7.5  Albumin 3.5 - 5.2 g/dL 3.8 4.1 4.0  AST 0 - 37 U/L $Remo'18 15 21  'baRGC$ ALT 0 - 35 U/L $Remo'20 12 19  'ZfzYZ$ Alk Phosphatase 39 - 117 U/L 81 93 96  Total Bilirubin 0.2 - 1.2 mg/dL 0.6 0.7 0.6  Bilirubin, Direct 0.0 -  0.3 mg/dL - - -     The 10-year ASCVD risk score Mikey Bussing DC Jr., et al., 2013) is: 9.8%   Values used to calculate the score:     Age: 16 years     Sex: Female     Is Non-Hispanic African American: Yes     Diabetic: No     Tobacco smoker: No     Systolic Blood Pressure: 161 mmHg     Is BP treated: Yes     HDL Cholesterol: 54.5 mg/dL     Total Cholesterol: 145 mg/dL   Patient has failed these meds in past: n/a Patient is currently controlled on the following medications:  . Atorvastatin 20 mg daily . Aspirin 81 mg daily  We discussed:  diet and exercise extensively; Cholesterol goals; benefits of statin for ASCVD risk reduction  Plan  Continue current medications and control with diet and exercise  GERD   Patient has failed these meds in past: n/a Patient is currently controlled on the following medications:  . Famotidine 40 mg daily - not taking  We discussed:  Pt reports  heartburn/reflux issues have improved since retiring, she has not needed to use any heartburn medications in a long time  Plan  Continue to monitor May use famotidine PRN for heartburn  Anemia   CBC Latest Ref Rng & Units 07/12/2019 10/08/2018 04/21/2018  WBC 4.0 - 10.5 K/uL 7.5 6.5 7.4  Hemoglobin 12.0 - 15.0 g/dL 11.8(L) 12.4 11.8(L)  Hematocrit 36 - 46 % 37.3 38.6 37.1  Platelets 150 - 400 K/uL 194.0 193.0 195.0   Iron/TIBC/Ferritin/ %Sat    Component Value Date/Time   IRON 70 10/08/2018 0921   TIBC 315 10/08/2018 0921   FERRITIN 197 10/08/2018 0921   IRONPCTSAT 22 10/08/2018 0960   Patient has failed these meds in past: n/a Patient is currently controlled on the following medications:  . Ferrous sulfate 325 mg daily - not taking  We discussed:  Pt reports she has not been taking iron in many months, she denies fatigue or weakness. May consider repeat iron panel if next hemoglobin is low.  Plan  Continue to monitor for anemai  Sleep difficulties   Patient has failed these meds in past: n/a Patient is currently uncontrolled on the following medications:  . Trazodone 50 mg daily  We discussed:  Pt reports not sleeping well. Falls asleep ok but wakes up in the middle of the night, gets ~5 hours per night. She denies feeling overly tired in the daytime however. Discussed benefits of melatonin to regulate sleep cycle  Plan  Continue current medications  Recommend melatonin 3 mg at bedtime  Diabetes   A1c goal <6.5%  Recent Relevant Labs: Lab Results  Component Value Date/Time   HGBA1C 6.2 07/12/2019 02:29 PM   HGBA1C 6.1 10/08/2018 09:21 AM   GFR 62.65 07/12/2019 02:29 PM   GFR 75.99 10/08/2018 09:21 AM   MICROALBUR 24.2 (H) 03/19/2009 09:36 AM    Last diabetic Eye exam:  Lab Results  Component Value Date/Time   HMDIABEYEEXA No Retinopathy 01/15/2018 12:00 AM    Last diabetic Foot exam: No results found for: HMDIABFOOTEX   No medications indicated.  We  discussed: diet and exercise extensively  Plan  Continue control with diet and exercise  Health Maintenance   Patient is currently controlled on the following medications:  . Black cohosh - hot flashes stopped . Klor Con 10 mEq TID - not taking . Lotrisone cream PRN  We  discussed:  Patient is satisfied with current regimen and denies issues  Plan  Continue current medications  Medication Management   Pt uses [facility] for all medications Uses pill box? Yes Pt endorses 100% compliance  We discussed: Verbal consent obtained for [facility] enhanced pharmacy services (medication synchronization, adherence packaging, delivery coordination). A medication sync plan was created to allow patient to get all medications delivered once every 30 to 90 days per patient preference. Patient understands they have freedom to choose pharmacy and clinical pharmacist will coordinate care between all prescribers and [facility].   Plan  Utilize [facility] for medication synchronization, packaging and delivery    Follow up: 6 month phone visit  Charlene Brooke, PharmD, [facility] Clinical Pharmacist [facility] [phone]

## 2020-03-14 NOTE — Patient Instructions (Addendum)
Visit Information  Phone number for Pharmacist: [phone]  Thank you for meeting with me to discuss your medications! I look forward to working with you to achieve your health care goals. Below is a summary of what we talked about during the visit:  Goals Addressed            This Visit's Progress   . Pharmacy Care Plan       CARE PLAN ENTRY (see longitudinal plan of care for additional care plan information)  Current Barriers:  . Chronic Disease Management support, education, and care coordination needs related to Hypertension and Hyperlipidemia   Hypertension BP Readings from Last 3 Encounters:  12/05/19 134/78  10/10/19 (!) 114/51  08/29/19 130/62 .  Pharmacist Clinical Goal(s): o Over the next 180 days, patient will work with PharmD and providers to maintain BP goal <130/80 . Current regimen:  o Amlodipine 10 mg daily o Atenolol 25 mg daily o Triamterene-HCTZ 37.5-25 mg daily . Interventions: o Discussed BP goals and benefits of medications for prevention of heart attack / stroke . Patient self care activities - Over the next 180 days, patient will: o Check BP weekly, document, and provide at future appointments o Ensure daily salt intake < 2300 mg/day  Hyperlipidemia Lab Results  Component Value Date/Time   LDLCALC 71 07/12/2019 02:29 PM   LDLDIRECT 162.0 08/05/2012 09:41 AM .  Pharmacist Clinical Goal(s): o Over the next 180 days, patient will work with PharmD and providers to maintain LDL goal < 100 . Current regimen:  o Atorvastatin 20 mg daily o Aspirin 81 mg daily . Interventions: o Discussed cholesterol goals and benefits of medications for prevention of heart attack / stroke . Patient self care activities - Over the next 180 days, patient will: o Continue current medications and lifestyle  Prediabetes Lab Results  Component Value Date/Time   HGBA1C 6.2 07/12/2019 02:29 PM   HGBA1C 6.1 10/08/2018 09:21 AM .  Pharmacist Clinical Goal(s): o Over  the next 180 days, patient will work with PharmD and providers to maintain A1c goal <6.5% . Current regimen:  o No medications  . Interventions: o Discussed blood sugar goals and benefits of healthy diet and exercise to prevent progression to diabetes . Patient self care activities - Over the next 180 days, patient will: o Continue healthy diet and exercise routine  Medication management . Pharmacist Clinical Goal(s): o Over the next 180 days, patient will work with PharmD and providers to achieve optimal medication adherence . Current pharmacy: [facility] . Interventions o Comprehensive medication review performed. o Utilize [facility] for medication synchronization, packaging and delivery . Patient self care activities - Over the next 180 days, patient will: o Focus on medication adherence by pill pack o Take medications as prescribed o Report any questions or concerns to PharmD and/or provider(s)  Initial goal documentation      Ms. Edsall was given information about Chronic Care Management services today including:  1. CCM service includes personalized support from designated clinical staff supervised by her physician, including individualized plan of care and coordination with other care providers 2. 24/7 contact phone numbers for assistance for urgent and routine care needs. 3. Standard insurance, coinsurance, copays and deductibles apply for chronic care management only during months in which we provide at least 20 minutes of these services. Most insurances cover these services at 100%, however patients may be responsible for any copay, coinsurance and/or deductible if applicable. This service may help you avoid the need for  more expensive face-to-face services. 4. Only one practitioner may furnish and bill the service in a calendar month. 5. The patient may stop CCM services at any time (effective at the end of the month) by phone call to the office staff.  Patient agreed  to services and verbal consent obtained.   Patient verbalizes understanding of instructions provided today.  Telephone follow up appointment with pharmacy team member scheduled for: 6 months  Charlene Brooke, PharmD, BCACP Clinical Pharmacist [facility] Maintenance for Postmenopausal Women Menopause is a normal process in which your ability to get pregnant comes to an end. This process happens slowly over many months or years, usually between the ages of 22 and 40. Menopause is complete when you have missed your menstrual periods for 12 months. It is important to talk with your health care provider about some of the most common conditions that affect women after menopause (postmenopausal women). These include heart disease, cancer, and bone loss (osteoporosis). Adopting a healthy lifestyle and getting preventive care can help to promote your health and wellness. The actions you take can also lower your chances of developing some of these common conditions. What should I know about menopause? During menopause, you may get a number of symptoms, such as:  Hot flashes. These can be moderate or severe.  Night sweats.  Decrease in sex drive.  Mood swings.  Headaches.  Tiredness.  Irritability.  Memory problems.  Insomnia. Choosing to treat or not to treat these symptoms is a decision that you make with your health care provider. Do I need hormone replacement therapy?  Hormone replacement therapy is effective in treating symptoms that are caused by menopause, such as hot flashes and night sweats.  Hormone replacement carries certain risks, especially as you become older. If you are thinking about using estrogen or estrogen with progestin, discuss the benefits and risks with your health care provider. What is my risk for heart disease and stroke? The risk of heart disease, heart attack, and stroke increases as you age. One of the causes  may be a change in the body's hormones during menopause. This can affect how your body uses dietary fats, triglycerides, and cholesterol. Heart attack and stroke are medical emergencies. There are many things that you can do to help prevent heart disease and stroke. Watch your blood pressure  High blood pressure causes heart disease and increases the risk of stroke. This is more likely to develop in people who have high blood pressure readings, are of African descent, or are overweight.  Have your blood pressure checked: ? Every 3-5 years if you are 72-70 years of age. ? Every year if you are 31 years old or older. Eat a healthy diet   Eat a diet that includes plenty of vegetables, fruits, low-fat dairy products, and lean protein.  Do not eat a lot of foods that are high in solid fats, added sugars, or sodium. Get regular exercise Get regular exercise. This is one of the most important things you can do for your health. Most adults should:  Try to exercise for at least 150 minutes each week. The exercise should increase your heart rate and make you sweat (moderate-intensity exercise).  Try to do strengthening exercises at least twice each week. Do these in addition to the moderate-intensity exercise.  Spend less time sitting. Even light physical activity can be beneficial. Other tips  Work with your health care provider to achieve or maintain a  healthy weight.  Do not use any products that contain nicotine or tobacco, such as cigarettes, e-cigarettes, and chewing tobacco. If you need help quitting, ask your health care provider.  Know your numbers. Ask your health care provider to check your cholesterol and your blood sugar (glucose). Continue to have your blood tested as directed by your health care provider. Do I need screening for cancer? Depending on your health history and family history, you may need to have cancer screening at different stages of your life. This may include  screening for:  Breast cancer.  Cervical cancer.  Lung cancer.  Colorectal cancer. What is my risk for osteoporosis? After menopause, you may be at increased risk for osteoporosis. Osteoporosis is a condition in which bone destruction happens more quickly than new bone creation. To help prevent osteoporosis or the bone fractures that can happen because of osteoporosis, you may take the following actions:  If you are 93-45 years old, get at least 1,000 mg of calcium and at least 600 mg of vitamin D per day.  If you are older than age 35 but younger than age 29, get at least 1,200 mg of calcium and at least 600 mg of vitamin D per day.  If you are older than age 78, get at least 1,200 mg of calcium and at least 800 mg of vitamin D per day. Smoking and drinking excessive alcohol increase the risk of osteoporosis. Eat foods that are rich in calcium and vitamin D, and do weight-bearing exercises several times each week as directed by your health care provider. How does menopause affect my mental health? Depression may occur at any age, but it is more common as you become older. Common symptoms of depression include:  Low or sad mood.  Changes in sleep patterns.  Changes in appetite or eating patterns.  Feeling an overall lack of motivation or enjoyment of activities that you previously enjoyed.  Frequent crying spells. Talk with your health care provider if you think that you are experiencing depression. General instructions See your health care provider for regular wellness exams and vaccines. This may include:  Scheduling regular health, dental, and eye exams.  Getting and maintaining your vaccines. These include: ? Influenza vaccine. Get this vaccine each year before the flu season begins. ? Pneumonia vaccine. ? Shingles vaccine. ? Tetanus, diphtheria, and pertussis (Tdap) booster vaccine. Your health care provider may also recommend other immunizations. Tell your health care  provider if you have ever been abused or do not feel safe at home. Summary  Menopause is a normal process in which your ability to get pregnant comes to an end.  This condition causes hot flashes, night sweats, decreased interest in sex, mood swings, headaches, or lack of sleep.  Treatment for this condition may include hormone replacement therapy.  Take actions to keep yourself healthy, including exercising regularly, eating a healthy diet, watching your weight, and checking your blood pressure and blood sugar levels.  Get screened for cancer and depression. Make sure that you are up to date with all your vaccines. This information is not intended to replace advice given to you by your health care provider. Make sure you discuss any questions you have with your health care provider. Document Revised: 05/19/2018 Document Reviewed: 05/19/2018 Elsevier Patient Education  2020 [employer].

## 2020-03-16 ENCOUNTER — Other Ambulatory Visit: Payer: Self-pay | Admitting: Internal Medicine

## 2020-04-12 ENCOUNTER — Other Ambulatory Visit: Payer: Self-pay

## 2020-04-12 ENCOUNTER — Telehealth: Payer: Self-pay

## 2020-04-12 MED ORDER — ATENOLOL 25 MG PO TABS
25.0000 mg | ORAL_TABLET | Freq: Every day | ORAL | 1 refills | Status: DC
Start: 2020-04-12 — End: 2020-08-16

## 2020-04-12 MED ORDER — ATORVASTATIN CALCIUM 20 MG PO TABS: ORAL_TABLET | ORAL | 1 refills | Status: DC

## 2020-04-12 MED ORDER — TRIAMTERENE-HCTZ 37.5-25 MG PO TABS: 1.0000 | ORAL_TABLET | Freq: Every day | ORAL | 1 refills | Status: DC

## 2020-04-12 MED ORDER — AMLODIPINE BESYLATE 10 MG PO TABS
10.0000 mg | ORAL_TABLET | Freq: Every day | ORAL | 1 refills | Status: DC
Start: 2020-04-12 — End: 2020-07-31

## 2020-04-12 NOTE — Telephone Encounter (Signed)
-----   Message from [place], [facility] sent at 04/09/2020 12:22 PM EDT ----- Regarding: Med refills Patient is switching to [facility] for pill packs, can you order refills of her meds there?  Amlodipine 10 mg Atenolol 25 mg Atorvastatin 20 mg Triamterene-HCTZ 37.5-25 mg

## 2020-04-12 NOTE — Telephone Encounter (Signed)
Sent in today 

## 2020-05-24 DIAGNOSIS — H04412 Chronic dacryocystitis of left lacrimal passage: Secondary | ICD-10-CM | POA: Diagnosis not present

## 2020-05-24 DIAGNOSIS — H04213 Epiphora due to excess lacrimation, bilateral lacrimal glands: Secondary | ICD-10-CM | POA: Diagnosis not present

## 2020-07-10 ENCOUNTER — Telehealth: Payer: Self-pay | Admitting: Pharmacist

## 2020-07-10 NOTE — Progress Notes (Signed)
° ° °  Chronic Care Management Pharmacy Assistant   Name: Brooke [name]  MRN: [id number] DOB: 1950-10-26  Reason for Encounter: Medication Review  Patient Questions:  1.  Have you seen any other providers since your last visit? No  2.  Any changes in your medicines or health? No    PCP : Binnie Rail, MD  Allergies:   Allergies  Allergen Reactions   Penicillins Hives    Medications: Outpatient Encounter Medications as of 07/10/2020  Medication Sig   amLODipine (NORVASC) 10 MG tablet Take 1 tablet (10 mg total) by mouth daily.   aspirin 81 MG chewable tablet Chew 81 mg by mouth daily.   atenolol (TENORMIN) 25 MG tablet Take 1 tablet (25 mg total) by mouth daily.   atorvastatin (LIPITOR) 20 MG tablet Take 1 tablet by mouth once daily   clotrimazole-betamethasone (LOTRISONE) cream Apply 1 application topically 2 (two) times daily. (Patient not taking: Reported on 03/14/2020)   [facility] BL[facility]HOS[facility] PO Take 1 tablet by mouth. As needed (Patient not taking: Reported on 03/14/2020)   famotidine (PEPCID) 40 MG tablet Take 1 tablet (40 mg total) by mouth daily. Annual appt is due must see provider for future refills (Patient not taking: Reported on 03/14/2020)   ferrous sulfate 325 (65 FE) MG tablet Take 324 mg by mouth daily with breakfast. (Patient not taking: Reported on 03/14/2020)   potassium chloride (KLOR-CON) 10 MEQ tablet TAKE 1 TABLET BY MOUTH THREE TIMES DAILY   traZODone (DESYREL) 50 MG tablet Take 50 mg by mouth as directed.   triamterene-hydrochlorothiazide (MAXZIDE-25) 37.5-25 MG tablet Take 1 tablet by mouth daily.   [DISCONTINUED] potassium chloride (KLOR-CON M10) 10 MEQ tablet Take 1 tablet (10 mEq total) by mouth three times daily.   No facility-administered encounter medications on file as of 07/10/2020.    Current Diagnosis: Patient Active Problem List   Diagnosis Date Noted   Rash and nonspecific skin eruption 12/05/2019   GERD (gastroesophageal reflux  disease) 06/01/2019   Hot flashes 10/08/2018   Chest tightness 08/24/2018   Pain of left heel 01/11/2018   Prediabetes 05/07/2015   Memory difficulties 05/07/2015   Bilateral leg edema 05/07/2015   Anemia 10/12/2013   FH: CAD (coronary artery disease) 04/27/2012   OSTEOARTHRITIS 03/30/2008   Hyperlipidemia 07/22/2007   OBESITY NOS 02/22/2007   Allergic rhinitis 02/22/2007   Essential hypertension 02/18/2007    Goals Addressed   None     Follow-Up:  Reviewed chart for medication changes ahead of medication coordination call.    BP Readings from Last 3 Encounters:  12/05/19 134/78  10/10/19 (!) 114/51  08/29/19 130/62    Lab Results  Component Value Date   HGBA1C 6.2 07/12/2019     Patient obtains medications through Vials  30 Days   Last adherence delivery included  (medication name and frequency)  Patient declined Triamt/hctz and atenolol last delivery due to patient states that she had some from last month and does not need any until month   Patient is due for next adherence delivery on: 07/19/2020. Called patient and reviewed medication needs.  Patient declined need for refills at this time. Patient is aware of how to contact pharmacist if refills are needed prior to next adherence delivery.   Wendy Poet, [place] [phone]

## 2020-07-30 ENCOUNTER — Other Ambulatory Visit: Payer: Self-pay | Admitting: Internal Medicine

## 2020-08-03 ENCOUNTER — Telehealth: Payer: Self-pay | Admitting: Pharmacist

## 2020-08-03 NOTE — Progress Notes (Signed)
    Chronic Care Management Pharmacy Assistant   Name: Brooke [name]  MRN: [id number] DOB: 1951/02/09  Reason for Encounter: Chart Review   PCP : Binnie Rail, MD  Allergies:   Allergies  Allergen Reactions  . Penicillins Hives    Medications: Outpatient Encounter Medications as of 08/03/2020  Medication Sig  . amLODipine (NORVASC) 10 MG tablet Take 1 tablet by mouth once daily  . aspirin 81 MG chewable tablet Chew 81 mg by mouth daily.  Marland Kitchen atenolol (TENORMIN) 25 MG tablet Take 1 tablet (25 mg total) by mouth daily.  Marland Kitchen atorvastatin (LIPITOR) 20 MG tablet Take 1 tablet by mouth once daily  . clotrimazole-betamethasone (LOTRISONE) cream Apply 1 application topically 2 (two) times daily. (Patient not taking: Reported on 03/14/2020)  . [facility] PO Take 1 tablet by mouth. As needed (Patient not taking: Reported on 03/14/2020)  . famotidine (PEPCID) 40 MG tablet Take 1 tablet (40 mg total) by mouth daily. Annual appt is due must see provider for future refills (Patient not taking: Reported on 03/14/2020)  . ferrous sulfate 325 (65 FE) MG tablet Take 324 mg by mouth daily with breakfast. (Patient not taking: Reported on 03/14/2020)  . potassium chloride (KLOR-CON) 10 MEQ tablet TAKE 1 TABLET BY MOUTH THREE TIMES DAILY  . traZODone (DESYREL) 50 MG tablet Take 50 mg by mouth as directed.  . triamterene-hydrochlorothiazide (MAXZIDE-25) 37.5-25 MG tablet Take 1 tablet by mouth daily.  . [DISCONTINUED] potassium chloride (KLOR-CON M10) 10 MEQ tablet Take 1 tablet (10 mEq total) by mouth three times daily.   No facility-administered encounter medications on file as of 08/03/2020.    Current Diagnosis: Patient Active Problem List   Diagnosis Date Noted  . Rash and nonspecific skin eruption 12/05/2019  . GERD (gastroesophageal reflux disease) 06/01/2019  . Hot flashes 10/08/2018  . Chest tightness 08/24/2018  . Pain of left heel 01/11/2018  . Prediabetes 05/07/2015  . Memory  difficulties 05/07/2015  . Bilateral leg edema 05/07/2015  . Anemia 10/12/2013  . FH: CAD (coronary artery disease) 04/27/2012  . OSTEOARTHRITIS 03/30/2008  . Hyperlipidemia 07/22/2007  . OBESITY NOS 02/22/2007  . Allergic rhinitis 02/22/2007  . Essential hypertension 02/18/2007    Goals Addressed   None     Follow-Up:  Pharmacist Review   Reviewed chart for medication changes and adherence.  No office, consults or hospital visits since last care coordination call with clinical pharmacist. No medication changes indicated  No gaps in adherence identified. Patient has follow up scheduled with pharmacy team. No further action required.   Wendy Poet, [place] [phone]

## 2020-08-09 ENCOUNTER — Telehealth: Payer: Self-pay | Admitting: Pharmacist

## 2020-08-13 ENCOUNTER — Other Ambulatory Visit: Payer: Self-pay

## 2020-08-13 NOTE — Progress Notes (Signed)
Subjective:    Patient ID: Brooke [name], female    DOB: Oct 08, 1950, 70 y.o.   MRN: [id number]  HPI The patient is here for an acute visit.  Her husband has been in the hospital for one month and he is not getting better.  The doctors are saying it may be time to let him go. She is not sleeping -she can not stop thinking about him day and night.    She eats very little.  She is not hungry.   She has lost weight.   Medications and allergies reviewed with patient and updated if appropriate.  Patient Active Problem List   Diagnosis Date Noted  . Rash and nonspecific skin eruption 12/05/2019  . GERD (gastroesophageal reflux disease) 06/01/2019  . Hot flashes 10/08/2018  . Chest tightness 08/24/2018  . Pain of left heel 01/11/2018  . Prediabetes 05/07/2015  . Memory difficulties 05/07/2015  . Bilateral leg edema 05/07/2015  . Anemia 10/12/2013  . FH: CAD (coronary artery disease) 04/27/2012  . OSTEOARTHRITIS 03/30/2008  . Hyperlipidemia 07/22/2007  . OBESITY NOS 02/22/2007  . Allergic rhinitis 02/22/2007  . Essential hypertension 02/18/2007    Current Outpatient Medications on File Prior to Visit  Medication Sig Dispense Refill  . amLODipine (NORVASC) 10 MG tablet Take 1 tablet by mouth once daily 90 tablet 0  . aspirin 81 MG chewable tablet Chew 81 mg by mouth daily.    Marland Kitchen atenolol (TENORMIN) 25 MG tablet Take 1 tablet (25 mg total) by mouth daily. 90 tablet 1  . atorvastatin (LIPITOR) 20 MG tablet Take 1 tablet by mouth once daily 90 tablet 0  . potassium chloride (KLOR-CON) 10 MEQ tablet TAKE 1 TABLET BY MOUTH THREE TIMES DAILY 270 tablet 0  . triamterene-hydrochlorothiazide (MAXZIDE-25) 37.5-25 MG tablet Take 1 tablet by mouth daily. 90 tablet 1  . [DISCONTINUED] potassium chloride (KLOR-CON M10) 10 MEQ tablet Take 1 tablet (10 mEq total) by mouth three times daily. 90 tablet 0   No current facility-administered medications on file prior to visit.    Past Medical  History:  Diagnosis Date  . FH: CAD (coronary artery disease)   . High cholesterol   . Hypertension   . Memory loss   . Obesity     Past Surgical History:  Procedure Laterality Date  . CARPAL TUNNEL RELEASE     bilateral  . HIP SURGERY      Social History   Socioeconomic History  . Marital status: Married    Spouse name: Not on file  . Number of children: 2  . Years of education: 12th   . Highest education level: Not on file  Occupational History  . Occupation: Psychologist, sport and exercise for [employer]    Comment: Laid Off  Tobacco Use  . Smoking status: Never Smoker  . Smokeless tobacco: Never Used  Substance and Sexual Activity  . Alcohol use: No  . Drug use: No  . Sexual activity: Not on file  Other Topics Concern  . 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        Social Determinants of Health   Financial Resource Strain: Low Risk   . Difficulty of Paying Living Expenses: Not hard at all  Food Insecurity: Not on file  Transportation Needs: Not on file  Physical Activity: Not on file  Stress: Not on file  Social Connections: Not on file  Family History  Problem Relation Age of Onset  . CAD Sister   . CAD Father   . Kidney disease Sister   . Lung cancer Mother   . Brain cancer Mother   . Colon cancer Neg Hx   . Stomach cancer Neg Hx   . Pancreatic cancer Neg Hx   . Esophageal cancer Neg Hx   . Colon polyps Neg Hx   . Rectal cancer Neg Hx     Review of Systems  Constitutional: Positive for appetite change and unexpected weight change (due to appetite loss).  Respiratory: Negative for shortness of breath.   Cardiovascular: Negative for chest pain and palpitations.  Neurological: Negative for light-headedness and headaches.  Psychiatric/Behavioral: Positive for dysphoric mood and sleep disturbance.       Objective:   Vitals:   08/14/20 0818  BP: 120/70  Pulse: 86  Temp:  98.6 F (37 C)  SpO2: 97%   BP Readings from Last 3 Encounters:  08/14/20 120/70  12/05/19 134/78  10/10/19 (!) 114/51   Wt Readings from Last 3 Encounters:  08/14/20 153 lb (69.4 kg)  12/05/19 173 lb (78.5 kg)  10/10/19 175 lb (79.4 kg)   Body mass index is 29.39 kg/m.   Physical Exam Constitutional:      General: She is not in acute distress.    Appearance: Normal appearance. She is not ill-appearing.  HENT:     Head: Normocephalic and atraumatic.  Skin:    General: Skin is warm and dry.  Neurological:     Mental Status: She is alert.  Psychiatric:        Behavior: Behavior normal.        Thought Content: Thought content normal.        Judgment: Judgment normal.     Comments: depressed mood            Assessment & Plan:    See Problem List for Assessment and Plan of chronic medical problems.    This visit occurred during the SARS-CoV-2 public health emergency.  Safety protocols were in place, including screening questions prior to the visit, additional usage of staff PPE, and extensive cleaning of exam room while observing appropriate contact time as indicated for disinfecting solutions.

## 2020-08-14 ENCOUNTER — Ambulatory Visit (INDEPENDENT_AMBULATORY_CARE_PROVIDER_SITE_OTHER): Payer: Medicare Other | Admitting: Internal Medicine

## 2020-08-14 ENCOUNTER — Encounter: Payer: Self-pay | Admitting: Internal Medicine

## 2020-08-14 DIAGNOSIS — G479 Sleep disorder, unspecified: Secondary | ICD-10-CM | POA: Diagnosis not present

## 2020-08-14 MED ORDER — TRAZODONE HCL 50 MG PO TABS: 25.0000 mg | ORAL_TABLET | Freq: Every evening | ORAL | 3 refills | Status: DC | PRN

## 2020-08-14 NOTE — Patient Instructions (Signed)
   Medications changes include :   Trazodone 25 mg - 50 mg at night.  If that does not work try 100 mg at night.   Update me on how this works.  We can try something different if needed.  Your prescription(s) have been submitted to your pharmacy. Please take as directed and contact our office if you believe you are having problem(s) with the medication(s).

## 2020-08-14 NOTE — Assessment & Plan Note (Signed)
Acute - related to husband being very sick, in hospital and likely going to die Not sleep or eating Deferred anti-depressant - just wants to treat sleep - feels she is doing ok during the day Start trazodone 25-50 mg at night, can try 100 mg if needed If that is not effective can try something else May need a low dose SSRI in near future Advised her to let us know if medication does not help

## 2020-08-15 ENCOUNTER — Other Ambulatory Visit: Payer: Self-pay | Admitting: Internal Medicine

## 2020-09-03 ENCOUNTER — Other Ambulatory Visit: Payer: Self-pay

## 2020-09-03 NOTE — Progress Notes (Signed)
Subjective:    Patient ID: Brooke [name], female    DOB: 06-22-1950, 70 y.o.   MRN: [id number]  HPI The patient is here for an acute visit.  Still trouble sleeping - trazodone prescribed earlier this month has not been effective.  She tried 50 mg.  Since she was here last her husband has died.  She does feel depressed at times and anxious at times.  She is still not able to sleep, which is why she is here today.  She does have good family support and friend support.  She is not talk to a Social worker.   Medications and allergies reviewed with patient and updated if appropriate.  Patient Active Problem List   Diagnosis Date Noted  . Rash and nonspecific skin eruption 12/05/2019  . GERD (gastroesophageal reflux disease) 06/01/2019  . Hot flashes 10/08/2018  . Chest tightness 08/24/2018  . Pain of left heel 01/11/2018  . Sleep difficulties 04/15/2016  . Prediabetes 05/07/2015  . Memory difficulties 05/07/2015  . Bilateral leg edema 05/07/2015  . Anemia 10/12/2013  . FH: CAD (coronary artery disease) 04/27/2012  . OSTEOARTHRITIS 03/30/2008  . Hyperlipidemia 07/22/2007  . OBESITY NOS 02/22/2007  . Allergic rhinitis 02/22/2007  . Essential hypertension 02/18/2007    Current Outpatient Medications on File Prior to Visit  Medication Sig Dispense Refill  . amLODipine (NORVASC) 10 MG tablet Take 1 tablet by mouth once daily 90 tablet 0  . aspirin 81 MG chewable tablet Chew 81 mg by mouth daily.    Marland Kitchen atenolol (TENORMIN) 25 MG tablet Take 1 tablet by mouth once daily 90 tablet 0  . atorvastatin (LIPITOR) 20 MG tablet Take 1 tablet by mouth once daily 90 tablet 0  . potassium chloride (KLOR-CON) 10 MEQ tablet TAKE 1 TABLET BY MOUTH THREE TIMES DAILY 270 tablet 0  . triamterene-hydrochlorothiazide (MAXZIDE-25) 37.5-25 MG tablet Take 1 tablet by mouth once daily 90 tablet 0  . [DISCONTINUED] potassium chloride (KLOR-CON M10) 10 MEQ tablet Take 1 tablet (10 mEq total) by mouth three times  daily. 90 tablet 0   No current facility-administered medications on file prior to visit.    Past Medical History:  Diagnosis Date  . FH: CAD (coronary artery disease)   . High cholesterol   . Hypertension   . Memory loss   . Obesity     Past Surgical History:  Procedure Laterality Date  . CARPAL TUNNEL RELEASE     bilateral  . HIP SURGERY      Social History   Socioeconomic History  . Marital status: Married    Spouse name: Not on file  . Number of children: 2  . Years of education: 12th   . Highest education level: Not on file  Occupational History  . Occupation: Psychologist, sport and exercise for [employer]    Comment: Laid Off  Tobacco Use  . Smoking status: Never Smoker  . Smokeless tobacco: Never Used  Substance and Sexual Activity  . Alcohol use: No  . Drug use: No  . Sexual activity: Not on file  Other Topics Concern  . 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        Social Determinants of Health   Financial Resource Strain: Low Risk   . Difficulty of Paying Living Expenses: Not hard at all  Food Insecurity: Not on file  Transportation Needs: Not on file  Physical Activity:  Not on file  Stress: Not on file  Social Connections: Not on file    Family History  Problem Relation Age of Onset  . CAD Sister   . CAD Father   . Kidney disease Sister   . Lung cancer Mother   . Brain cancer Mother   . Colon cancer Neg Hx   . Stomach cancer Neg Hx   . Pancreatic cancer Neg Hx   . Esophageal cancer Neg Hx   . Colon polyps Neg Hx   . Rectal cancer Neg Hx     Review of Systems  Constitutional: Positive for appetite change (decreased).  Respiratory: Negative for shortness of breath.   Cardiovascular: Negative for chest pain and palpitations.  Neurological: Negative for light-headedness and headaches.  Psychiatric/Behavioral: Positive for dysphoric mood (somewhat - grieving) and sleep  disturbance. The patient is nervous/anxious.        Objective:   Vitals:   09/04/20 0858  BP: 136/70  Pulse: 82  Temp: 99.1 F (37.3 C)  SpO2: 97%   BP Readings from Last 3 Encounters:  09/04/20 136/70  08/14/20 120/70  12/05/19 134/78   Wt Readings from Last 3 Encounters:  09/04/20 151 lb (68.5 kg)  08/14/20 153 lb (69.4 kg)  12/05/19 173 lb (78.5 kg)   Body mass index is 29 kg/m.  Depression screen [facility] 2/9 09/04/2020 07/12/2019 10/08/2018 04/21/2018 08/21/2016  Decreased Interest 3 0 0 0 0  Down, Depressed, Hopeless 3 0 0 0 0  PHQ - 2 Score 6 0 0 0 0  Altered sleeping 3 - - - -  Tired, decreased energy 3 - - - -  Change in appetite 3 - - - -  Feeling bad or failure about yourself  2 - - - -  Trouble concentrating 3 - - - -  Moving slowly or fidgety/restless 3 - - - -  Suicidal thoughts 0 - - - -  PHQ-9 Score 23 - - - -  Difficult doing work/chores Very difficult - - - -    GAD 7 : Generalized Anxiety Score 09/04/2020  Nervous, Anxious, on Edge 3  Control/stop worrying 3  Worry too much - different things 2  Trouble relaxing 3  Restless 3  Easily annoyed or irritable 0  Afraid - awful might happen 3  Total GAD 7 Score 17  Anxiety Difficulty Somewhat difficult        Physical Exam Constitutional:      General: She is not in acute distress.    Appearance: Normal appearance. She is not ill-appearing.  HENT:     Head: Normocephalic and atraumatic.  Skin:    General: Skin is warm and dry.  Neurological:     Mental Status: She is alert.  Psychiatric:        Behavior: Behavior normal.        Thought Content: Thought content normal.        Judgment: Judgment normal.     Comments: Depressed affect and mood            Assessment & Plan:    See Problem List for Assessment and Plan of chronic medical problems.    This visit occurred during the SARS-CoV-2 public health emergency.  Safety protocols were in place, including screening questions prior to  the visit, additional usage of staff PPE, and extensive cleaning of exam room while observing appropriate contact time as indicated for disinfecting solutions.

## 2020-09-04 ENCOUNTER — Encounter: Payer: Self-pay | Admitting: Internal Medicine

## 2020-09-04 ENCOUNTER — Ambulatory Visit (INDEPENDENT_AMBULATORY_CARE_PROVIDER_SITE_OTHER): Payer: Medicare Other | Admitting: Internal Medicine

## 2020-09-04 DIAGNOSIS — G479 Sleep disorder, unspecified: Secondary | ICD-10-CM

## 2020-09-04 DIAGNOSIS — F4321 Adjustment disorder with depressed mood: Secondary | ICD-10-CM

## 2020-09-04 DIAGNOSIS — F32A Depression, unspecified: Secondary | ICD-10-CM | POA: Insufficient documentation

## 2020-09-04 DIAGNOSIS — F329 Major depressive disorder, single episode, unspecified: Secondary | ICD-10-CM | POA: Insufficient documentation

## 2020-09-04 HISTORY — DX: Major depressive disorder, single episode, unspecified: F32.9

## 2020-09-04 MED ORDER — MIRTAZAPINE 15 MG PO TABS: 7.5000 mg | ORAL_TABLET | Freq: Every day | ORAL | 1 refills | Status: DC

## 2020-09-04 NOTE — Assessment & Plan Note (Signed)
Acute Sleeping difficulties started when her husband became sick recently and he recently died We did try trazodone 50 mg at night and this did not help so we will discontinue it She does have some elements of depression and anxiety as well as insomnia We will try Remeron 7.5 mg - 15 mg at night She will let me know if the above medications not effective She does have a good family and friend support Discussed speaking with a therapist-grief counselor from hospice and she will consider this She will update me on how the medication is working

## 2020-09-04 NOTE — Telephone Encounter (Cosign Needed)
    Chronic Care Management Pharmacy Assistant   Name: Brooke [name]  MRN: [id number] DOB: September 08, 1950   Reason for Encounter: Chart Review    Medications: Outpatient Encounter Medications as of 08/09/2020  Medication Sig  . amLODipine (NORVASC) 10 MG tablet Take 1 tablet by mouth once daily  . aspirin 81 MG chewable tablet Chew 81 mg by mouth daily.  Marland Kitchen atorvastatin (LIPITOR) 20 MG tablet Take 1 tablet by mouth once daily  . potassium chloride (KLOR-CON) 10 MEQ tablet TAKE 1 TABLET BY MOUTH THREE TIMES DAILY  . [DISCONTINUED] atenolol (TENORMIN) 25 MG tablet Take 1 tablet (25 mg total) by mouth daily.  . [DISCONTINUED] clotrimazole-betamethasone (LOTRISONE) cream Apply 1 application topically 2 (two) times daily. (Patient not taking: Reported on 03/14/2020)  . [DISCONTINUED] [facility] BL[facility] COHOSH PO Take 1 tablet by mouth. As needed (Patient not taking: Reported on 03/14/2020)  . [DISCONTINUED] famotidine (PEPCID) 40 MG tablet Take 1 tablet (40 mg total) by mouth daily. Annual appt is due must see provider for future refills (Patient not taking: Reported on 03/14/2020)  . [DISCONTINUED] ferrous sulfate 325 (65 FE) MG tablet Take 324 mg by mouth daily with breakfast. (Patient not taking: Reported on 03/14/2020)  . [DISCONTINUED] traZODone (DESYREL) 50 MG tablet Take 50 mg by mouth as directed.  . [DISCONTINUED] triamterene-hydrochlorothiazide (MAXZIDE-25) 37.5-25 MG tablet Take 1 tablet by mouth daily.   No facility-administered encounter medications on file as of 08/09/2020.    Reviewed chart for medication changes and adherence.    No gaps in adherence identified. Patient has follow up scheduled with pharmacy team. No further action required.   Wendy Poet, [place] [phone]

## 2020-09-04 NOTE — Patient Instructions (Signed)
Stop trazodone.    Start mirtazapine at night - start with 1/2 pill or 7.5 mg at night - if that does not work try 1 pill or 15 mg at night.

## 2020-09-04 NOTE — Assessment & Plan Note (Signed)
Acute Her husband died earlier this month and as expected she is experiencing some depression, mild anxiety and difficulty sleeping She has good social support Encouraged her to speak with one of the hospice grief nurses We will start Remeron for her sleep, which will hopefully help some depression as well Remeron 7.5-15 mg at night

## 2020-09-07 ENCOUNTER — Telehealth: Payer: Self-pay | Admitting: Pharmacist

## 2020-09-10 NOTE — Progress Notes (Signed)
A call was made to Ms. Vater for a medication coordination call for delivery this month. Spoke with the patient who stated that she was switch back to her former pharmacy at this time.   [place] Pharmacist Assistant [phone]

## 2020-09-10 NOTE — Telephone Encounter (Signed)
Updated preferred pharmacy to [place]:  [facility] [place], [place] [address] [place] [address] Phone: [phone] Fax: [phone]

## 2020-09-12 ENCOUNTER — Telehealth: Payer: Medicare Other

## 2020-09-19 NOTE — Progress Notes (Signed)
Subjective:    Patient ID: Brooke [name], female    DOB: Jun 05, 1951, 70 y.o.   MRN: [id number]  HPI The patient is here for an acute visit.   DOE - she gets short winded when she is doing things and she can hear her heart beating in her ears.  Heartbeat does not feel irregular just stronger.  The winded feeling started 3-4 days ago.  It is getting worse.  She denies chest pain. She recently started doing some exercises.  She feels the DOE with those exercises and doing things around the house.  She never felt winded doing activities around the house in the past.  Her appetite is still low.  With the pills her sleep is better.      Last Echo 2013 -  Normal   Medications and allergies reviewed with patient and updated if appropriate.  Patient Active Problem List   Diagnosis Date Noted  . Grieving 09/04/2020  . Rash and nonspecific skin eruption 12/05/2019  . GERD (gastroesophageal reflux disease) 06/01/2019  . Hot flashes 10/08/2018  . Chest tightness 08/24/2018  . Pain of left heel 01/11/2018  . Sleep difficulties 04/15/2016  . Prediabetes 05/07/2015  . Memory difficulties 05/07/2015  . Bilateral leg edema 05/07/2015  . Anemia 10/12/2013  . FH: CAD (coronary artery disease) 04/27/2012  . OSTEOARTHRITIS 03/30/2008  . Hyperlipidemia 07/22/2007  . OBESITY NOS 02/22/2007  . Allergic rhinitis 02/22/2007  . Essential hypertension 02/18/2007    Current Outpatient Medications on File Prior to Visit  Medication Sig Dispense Refill  . amLODipine (NORVASC) 10 MG tablet Take 1 tablet by mouth once daily 90 tablet 0  . aspirin 81 MG chewable tablet Chew 81 mg by mouth daily.    Marland Kitchen atenolol (TENORMIN) 25 MG tablet Take 1 tablet by mouth once daily 90 tablet 0  . atorvastatin (LIPITOR) 20 MG tablet Take 1 tablet by mouth once daily 90 tablet 0  . mirtazapine (REMERON) 15 MG tablet Take 0.5-1 tablets (7.5-15 mg total) by mouth at bedtime. 30 tablet 1  . potassium chloride  (KLOR-CON) 10 MEQ tablet TAKE 1 TABLET BY MOUTH THREE TIMES DAILY 270 tablet 0  . triamterene-hydrochlorothiazide (MAXZIDE-25) 37.5-25 MG tablet Take 1 tablet by mouth once daily 90 tablet 0  . [DISCONTINUED] potassium chloride (KLOR-CON M10) 10 MEQ tablet Take 1 tablet (10 mEq total) by mouth three times daily. 90 tablet 0   No current facility-administered medications on file prior to visit.    Past Medical History:  Diagnosis Date  . FH: CAD (coronary artery disease)   . High cholesterol   . Hypertension   . Memory loss   . Obesity     Past Surgical History:  Procedure Laterality Date  . CARPAL TUNNEL RELEASE     bilateral  . HIP SURGERY      Social History   Socioeconomic History  . Marital status: Married    Spouse name: Not on file  . Number of children: 2  . Years of education: 12th   . Highest education level: Not on file  Occupational History  . Occupation: Psychologist, sport and exercise for [employer]    Comment: Laid Off  Tobacco Use  . Smoking status: Never Smoker  . Smokeless tobacco: Never Used  Substance and Sexual Activity  . Alcohol use: No  . Drug use: No  . Sexual activity: Not on file  Other Topics Concern  . 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        Social Determinants of Health   Financial Resource Strain: Low Risk   . Difficulty of Paying Living Expenses: Not hard at all  Food Insecurity: Not on file  Transportation Needs: Not on file  Physical Activity: Not on file  Stress: Not on file  Social Connections: Not on file    Family History  Problem Relation Age of Onset  . CAD Sister   . CAD Father   . Kidney disease Sister   . Lung cancer Mother   . Brain cancer Mother   . Colon cancer Neg Hx   . Stomach cancer Neg Hx   . Pancreatic cancer Neg Hx   . Esophageal cancer Neg Hx   . Colon polyps Neg Hx   . Rectal cancer Neg Hx     Review of Systems   Constitutional: Negative for chills and fever.  HENT: Negative for congestion, sinus pain and sore throat.   Respiratory: Positive for shortness of breath. Negative for cough and wheezing.   Cardiovascular: Negative for chest pain, palpitations and leg swelling.  Gastrointestinal: Positive for blood in stool (no black stools). Negative for abdominal pain, constipation, diarrhea and nausea.  Genitourinary: Positive for frequency (at night). Negative for dysuria and hematuria.  Neurological: Negative for light-headedness and headaches.       Objective:   Vitals:   09/20/20 0916  BP: 130/68  Pulse: 92  Temp: 98.9 F (37.2 C)  SpO2: 97%   BP Readings from Last 3 Encounters:  09/20/20 130/68  09/04/20 136/70  08/14/20 120/70   Wt Readings from Last 3 Encounters:  09/20/20 151 lb (68.5 kg)  09/04/20 151 lb (68.5 kg)  08/14/20 153 lb (69.4 kg)   Body mass index is 29 kg/m.   Physical Exam    Constitutional: Appears well-developed and well-nourished. No distress.  Head: Normocephalic and atraumatic.  Neck: Neck supple. No tracheal deviation present. No thyromegaly present.  No cervical lymphadenopathy Cardiovascular: Normal rate, regular rhythm and normal heart sounds.  No murmur heard. No carotid bruit .  No edema Pulmonary/Chest: Effort normal and breath sounds normal. No respiratory distress. No has no wheezes. No rales. Abdomen: Soft, nontender, nondistended Skin: Skin is warm and dry. Not diaphoretic.  Psychiatric: Normal mood and affect. Behavior is normal.       Assessment & Plan:    See Problem List for Assessment and Plan of chronic medical problems.    This visit occurred during the SARS-CoV-2 public health emergency.  Safety protocols were in place, including screening questions prior to the visit, additional usage of staff PPE, and extensive cleaning of exam room while observing appropriate contact time as indicated for disinfecting solutions.

## 2020-09-19 NOTE — Patient Instructions (Addendum)
  An EKG was done today.  It looked good.    Blood work was ordered.  Have a chest xray downstairs.     Medications changes include :   none     A referral was ordered for cardiology.       Someone from their office will call you to schedule an appointment.

## 2020-09-20 ENCOUNTER — Other Ambulatory Visit: Payer: Self-pay

## 2020-09-20 ENCOUNTER — Ambulatory Visit (INDEPENDENT_AMBULATORY_CARE_PROVIDER_SITE_OTHER): Payer: Medicare Other | Admitting: Internal Medicine

## 2020-09-20 ENCOUNTER — Encounter: Payer: Self-pay | Admitting: Internal Medicine

## 2020-09-20 VITALS — BP 130/68 | HR 92 | Temp 98.9°F | Ht 60.5 in | Wt 151.0 lb

## 2020-09-20 DIAGNOSIS — R0609 Other forms of dyspnea: Secondary | ICD-10-CM | POA: Insufficient documentation

## 2020-09-20 DIAGNOSIS — R0600 Dyspnea, unspecified: Secondary | ICD-10-CM | POA: Diagnosis not present

## 2020-09-20 LAB — CBC WITH DIFFERENTIAL/PLATELET
Basophils Absolute: 0.1 10*3/uL (ref 0.0–0.1)
Basophils Relative: 1.1 % (ref 0.0–3.0)
Eosinophils Absolute: 0.1 10*3/uL (ref 0.0–0.7)
Eosinophils Relative: 1 % (ref 0.0–5.0)
HCT: 36.6 % (ref 36.0–46.0)
Hemoglobin: 11.6 g/dL — ABNORMAL LOW (ref 12.0–15.0)
Lymphocytes Relative: 15.6 % (ref 12.0–46.0)
Lymphs Abs: 1.3 10*3/uL (ref 0.7–4.0)
MCHC: 31.7 g/dL (ref 30.0–36.0)
MCV: 74 fl — ABNORMAL LOW (ref 78.0–100.0)
Monocytes Absolute: 0.7 10*3/uL (ref 0.1–1.0)
Monocytes Relative: 8.5 % (ref 3.0–12.0)
Neutro Abs: 6 10*3/uL (ref 1.4–7.7)
Neutrophils Relative %: 73.8 % (ref 43.0–77.0)
Platelets: 201 10*3/uL (ref 150.0–400.0)
RBC: 4.94 Mil/uL (ref 3.87–5.11)
RDW: 16.1 % — ABNORMAL HIGH (ref 11.5–15.5)
WBC: 8.2 10*3/uL (ref 4.0–10.5)

## 2020-09-20 LAB — COMPREHENSIVE METABOLIC PANEL
ALT: 18 U/L (ref 0–35)
AST: 22 U/L (ref 0–37)
Albumin: 3.7 g/dL (ref 3.5–5.2)
Alkaline Phosphatase: 84 U/L (ref 39–117)
BUN: 22 mg/dL (ref 6–23)
CO2: 30 mEq/L (ref 19–32)
Calcium: 10.4 mg/dL (ref 8.4–10.5)
Chloride: 104 mEq/L (ref 96–112)
Creatinine, Ser: 1.05 mg/dL (ref 0.40–1.20)
GFR: 53.49 mL/min — ABNORMAL LOW (ref >60.00)
Glucose, Bld: 93 mg/dL (ref 70–99)
Potassium: 3.7 mEq/L (ref 3.5–5.1)
Sodium: 141 mEq/L (ref 135–145)
Total Bilirubin: 0.7 mg/dL (ref 0.2–1.2)
Total Protein: 7.2 g/dL (ref 6.0–8.3)

## 2020-09-20 LAB — BRAIN NATRIURETIC PEPTIDE: Pro B Natriuretic peptide (BNP): 16 pg/mL (ref 0.0–100.0)

## 2020-09-20 NOTE — Assessment & Plan Note (Signed)
Acute Started 3-4 days ago-only occurs with activity-housework and household activities and exercises, which include chair exercises Associated with hearing her heartbeat in her ears which is unusual No chest pain or palpitations, or other symptoms EKG today shows normal sinus rhythm at 83 bpm, normal EKG without significant changes from previous EKG from 08/2018 ?  Cardiac in nature.  No edema to heart failure seems unlikely Will refer to cardiology CBC, CMP, BNP Chest x-ray today She will call if her symptoms worsen prior to seeing cardiology

## 2020-09-26 NOTE — Addendum Note (Signed)
Addended by: Marcina Millard on: 09/26/2020 09:12 AM   Modules accepted: Orders

## 2020-10-04 ENCOUNTER — Encounter: Payer: Self-pay | Admitting: Cardiovascular Disease

## 2020-10-04 NOTE — Progress Notes (Signed)
Cardiology Office Note:    Date:  10/05/2020   ID:  Brooke [name], DOB 27-Oct-1950, MRN [id number]  PCP:  Brooke Rail, MD   [place]  Cardiologist:  Brooke [name]  Advanced Practice Provider:  No care team member to display Electrophysiologist:  None  :[id number]}   Referring MD: Brooke Rail, MD   Chief Complaint  Patient presents with  . Shortness of Breath  . Hypertension    October 05, 2020   Brooke [name] is a 70 y.o. female with a hx of HTN, HLD, and family hx of CAD .  We were asked to see her for further evaluation of her shortness of breath by Dr. Quay [name].  1 month of having a heavy heart beat - seems louder  Husband passed away about the same time  No appetite,  Not eating or drinking regularly   Her palpitations are not irregular.  It is just that her heart seems to be pounding harder instead of beating regularly.  Feels well when she does get out to do yard work .  So it needs to be  Is just now getting some yard and garden work .  Does not feel inclined to do much  Was offered counseling from church - advised her to go ahead  Her anniversary ( 52nd) was this past Monday  Was healthy and active prior to his passing  He was on dialysis  Is on numerous BP meds Advised her to take her BP on occasion     Past Medical History:  Diagnosis Date  . FH: CAD (coronary artery disease)   . High cholesterol   . Hypertension   . Memory loss   . Obesity     Past Surgical History:  Procedure Laterality Date  . CARPAL TUNNEL RELEASE     bilateral  . HIP SURGERY      Current Medications: Current Meds  Medication Sig  . amLODipine (NORVASC) 10 MG tablet Take 1 tablet by mouth once daily  . aspirin 81 MG chewable tablet Chew 81 mg by mouth daily.  Marland Kitchen atenolol (TENORMIN) 25 MG tablet Take 1 tablet by mouth once daily  . atorvastatin (LIPITOR) 20 MG tablet Take 1 tablet by mouth once daily  . mirtazapine (REMERON) 15 MG tablet Take  0.5-1 tablets (7.5-15 mg total) by mouth at bedtime.  . potassium chloride (KLOR-CON) 10 MEQ tablet TAKE 1 TABLET BY MOUTH THREE TIMES DAILY  . triamterene-hydrochlorothiazide (MAXZIDE-25) 37.5-25 MG tablet Take 1 tablet by mouth once daily     Allergies:   Penicillins   Social History   Socioeconomic History  . Marital status: Married    Spouse name: Not on file  . Number of children: 2  . Years of education: 12th   . Highest education level: Not on file  Occupational History  . Occupation: Psychologist, sport and exercise for [employer]    Comment: Laid Off  Tobacco Use  . Smoking status: Never Smoker  . Smokeless tobacco: Never Used  Substance and Sexual Activity  . Alcohol use: No  . Drug use: No  . Sexual activity: Not on file  Other Topics Concern  . 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        Social Determinants of Health   Financial Resource Strain: Low Risk   . Difficulty of Paying Living Expenses: Not hard at all  Food Insecurity: Not on file  Transportation Needs: Not on file  Physical Activity: Not on file  Stress: Not on file  Social Connections: Not on file     Family History: The patient's family history includes Brain cancer in her mother; CAD in her father and sister; Kidney disease in her sister; Lung cancer in her mother. There is no history of Colon cancer, Stomach cancer, Pancreatic cancer, Esophageal cancer, Colon polyps, or Rectal cancer.  ROS:   Please see the history of present illness.     All other systems reviewed and are negative.  EKGs/Labs/Other Studies Reviewed:    The following studies were reviewed today:   EKG:   October 05, 2020: Normal sinus rhythm.  No ST or T wave changes.  Recent Labs: 09/20/2020: ALT 18; BUN 22; Creatinine, Ser 1.05; Hemoglobin 11.6; Platelets 201.0; Potassium 3.7; Pro B Natriuretic peptide (BNP) 16.0; Sodium 141  Recent Lipid Panel     Component Value Date/Time   CHOL 145 07/12/2019 1429   TRIG 100.0 07/12/2019 1429   HDL 54.50 07/12/2019 1429   CHOLHDL 3 07/12/2019 1429   VLDL 20.0 07/12/2019 1429   LDLCALC 71 07/12/2019 1429   LDLDIRECT 162.0 08/05/2012 0941     Risk Assessment/Calculations:       Physical Exam:    VS:  BP 112/66   Pulse 78   Ht 5' 0.6" (1.539 m)   Wt 153 lb 9.6 oz (69.7 kg)   SpO2 99%   BMI 29.41 kg/m     Wt Readings from Last 3 Encounters:  10/05/20 153 lb 9.6 oz (69.7 kg)  09/20/20 151 lb (68.5 kg)  09/04/20 151 lb (68.5 kg)     GEN:  Well nourished, well developed in no acute distress HEENT: Normal NECK: No JVD; No carotid bruits LYMPHATICS: No lymphadenopathy CARDIAC: RRR, no murmurs, rubs, gallops RESPIRATORY:  Clear to auscultation without rales, wheezing or rhonchi  ABDOMEN: Soft, non-tender, non-distended MUSCULOSKELETAL:  No edema; No deformity  SKIN: Warm and dry NEUROLOGIC:  Alert and oriented x 3 PSYCHIATRIC:  Normal affect   ASSESSMENT:    1. Palpitations    PLAN:    In order of problems listed above:  1. Palpitations: She complains of palpitations.  These episodes occurred approximately 1 month ago following the death of her husband.  Respite had been very sick and was on dialysis.  She had been the primary caregiver for her husband for several years.  She admits that she is not eating or drinking regularly since his death.  She is on numerous blood pressure medications and I suspect that she is somewhat volume depleted.  I have advised her to work on getting better diet and to make sure that she stays hydrated.  She will need to either consider adding some electrolyte solutions to her water or consider drinking a V8 juice on occasion.  Of asked her to make sure she gets plenty of protein in her diet.  She will start taking her blood pressure on occasion.  If her blood pressure is low then she should call her primary medical doctor and consider stopping or  reducing the dose on some of her medications. We will get an echocardiogram to to further evaluate her LV function and valvular function.  I will see her on an as-needed basis.       Medication Adjustments/Labs and Tests Ordered: Current medicines are reviewed at length with the patient today.  Concerns regarding medicines are outlined above.  Orders Placed This Encounter  Procedures  . EKG 12-Lead  . ECHOCARDIOGRAM COMPLETE   No orders of the defined types were placed in this encounter.   Patient Instructions  Medication Instructions:  Your physician recommends that you continue on your current medications as directed. Please refer to the Current Medication list given to you today.  *If you need a refill on your cardiac medications before your next appointment, please call your pharmacy*   Lab Work: none If you have labs (blood work) drawn today and your tests are completely normal, you will receive your results only by: Marland Kitchen MyChart Message (if you have MyChart) OR . A paper copy in the mail If you have any lab test that is abnormal or we need to change your treatment, we will call you to review the results.   Testing/Procedures: Your physician has requested that you have an echocardiogram. Echocardiography is a painless test that uses sound waves to create images of your heart. It provides your doctor with information about the size and shape of your heart and how well your heart's chambers and valves are working. This procedure takes approximately one hour. There are no restrictions for this procedure.     Follow-Up: At [facility], you and your health needs are our priority.  As part of our continuing mission to provide you with exceptional heart care, we have created designated Provider Care Teams.  These Care Teams include your primary Cardiologist (physician) and Advanced Practice Providers (APPs -  Physician Assistants and Nurse Practitioners) who all work together to  provide you with the care you need, when you need it.   Your next appointment:    as needed  The format for your next appointment:   In Person  Provider:   You may see Dr. Acie Fredrickson or one of the following Advanced Practice Providers on your designated Care Team:    Richardson Dopp, PA-C  Robbie Lis, [place]      Signed, Mertie Moores, MD  10/05/2020 10:19 AM    [place]

## 2020-10-05 ENCOUNTER — Encounter: Payer: Self-pay | Admitting: Cardiovascular Disease

## 2020-10-05 ENCOUNTER — Other Ambulatory Visit: Payer: Self-pay

## 2020-10-05 ENCOUNTER — Telehealth: Payer: Self-pay | Admitting: Internal Medicine

## 2020-10-05 ENCOUNTER — Ambulatory Visit: Payer: Medicare Other | Admitting: Cardiovascular Disease

## 2020-10-05 VITALS — BP 112/66 | HR 78 | Ht 60.6 in | Wt 153.6 lb

## 2020-10-05 DIAGNOSIS — R002 Palpitations: Secondary | ICD-10-CM | POA: Insufficient documentation

## 2020-10-05 DIAGNOSIS — I10 Essential (primary) hypertension: Secondary | ICD-10-CM

## 2020-10-05 NOTE — Telephone Encounter (Signed)
LVM for pt to rtn my call to schedule AWV with [facility]. Please schedule appt if pt calls the office.  

## 2020-10-05 NOTE — Patient Instructions (Signed)
Medication Instructions:  Your physician recommends that you continue on your current medications as directed. Please refer to the Current Medication list given to you today.  *If you need a refill on your cardiac medications before your next appointment, please call your pharmacy*   Lab Work: none If you have labs (blood work) drawn today and your tests are completely normal, you will receive your results only by: Marland Kitchen MyChart Message (if you have MyChart) OR . A paper copy in the mail If you have any lab test that is abnormal or we need to change your treatment, we will call you to review the results.   Testing/Procedures: Your physician has requested that you have an echocardiogram. Echocardiography is a painless test that uses sound waves to create images of your heart. It provides your doctor with information about the size and shape of your heart and how well your heart's chambers and valves are working. This procedure takes approximately one hour. There are no restrictions for this procedure.     Follow-Up: At [facility], you and your health needs are our priority.  As part of our continuing mission to provide you with exceptional heart care, we have created designated Provider Care Teams.  These Care Teams include your primary Cardiologist (physician) and Advanced Practice Providers (APPs -  Physician Assistants and Nurse Practitioners) who all work together to provide you with the care you need, when you need it.   Your next appointment:    as needed  The format for your next appointment:   In Person  Provider:   You may see Dr. Acie Fredrickson or one of the following Advanced Practice Providers on your designated Care Team:    Richardson Dopp, PA-C  [place], [place]

## 2020-10-18 ENCOUNTER — Ambulatory Visit: Payer: Medicare Other

## 2020-10-23 ENCOUNTER — Telehealth: Payer: Self-pay | Admitting: Internal Medicine

## 2020-10-23 NOTE — Telephone Encounter (Signed)
Called pt regarding rescheduling her appt with [facility] on 11/01/20. Patient stated that she does not want to reschedule appt with [facility]. Because [facility] is always out and has been rescheduled 3 times. I apologized to the patient and told her that I would not call her back to reschedule AWV. Notes where put into the comment section of her chart.

## 2020-10-23 NOTE — Telephone Encounter (Signed)
    DO NOT CALL PATIENT TO SCHEDULE AWV

## 2020-10-23 NOTE — Telephone Encounter (Signed)
Message left for patient to return call to clinic.  Please see message in her chart. It looks like the [place] called for patient to cancel her wellness visit.  We do not have anything upcoming with Dr. Quay Burow.

## 2020-10-23 NOTE — Telephone Encounter (Signed)
Patient returned call to cancel an appointment. She can be reached at [phone]. Please advise

## 2020-10-23 NOTE — Telephone Encounter (Signed)
  DO NOT CALL PATIENT TO SCHEDULE AWV AT THIS TIME- PER PATIENT

## 2020-11-01 ENCOUNTER — Ambulatory Visit: Payer: Medicare Other

## 2020-11-02 ENCOUNTER — Ambulatory Visit (HOSPITAL_COMMUNITY): Payer: Medicare Other | Attending: Cardiology

## 2020-11-02 ENCOUNTER — Other Ambulatory Visit: Payer: Self-pay

## 2020-11-02 DIAGNOSIS — E785 Hyperlipidemia, unspecified: Secondary | ICD-10-CM | POA: Diagnosis not present

## 2020-11-02 DIAGNOSIS — R002 Palpitations: Secondary | ICD-10-CM

## 2020-11-02 DIAGNOSIS — I10 Essential (primary) hypertension: Secondary | ICD-10-CM | POA: Diagnosis not present

## 2020-11-02 DIAGNOSIS — Z8249 Family history of ischemic heart disease and other diseases of the circulatory system: Secondary | ICD-10-CM | POA: Diagnosis not present

## 2020-11-02 LAB — ECHOCARDIOGRAM COMPLETE
Area-P 1/2: 3.05 cm2
P 1/2 time: 912 msec
S' Lateral: 2.2 cm

## 2020-11-06 ENCOUNTER — Other Ambulatory Visit: Payer: Self-pay | Admitting: Internal Medicine

## 2020-11-15 ENCOUNTER — Ambulatory Visit (INDEPENDENT_AMBULATORY_CARE_PROVIDER_SITE_OTHER): Payer: Medicare Other | Admitting: Pharmacist

## 2020-11-15 ENCOUNTER — Other Ambulatory Visit: Payer: Self-pay

## 2020-11-15 DIAGNOSIS — G479 Sleep disorder, unspecified: Secondary | ICD-10-CM

## 2020-11-15 DIAGNOSIS — I10 Essential (primary) hypertension: Secondary | ICD-10-CM | POA: Diagnosis not present

## 2020-11-15 DIAGNOSIS — E7849 Other hyperlipidemia: Secondary | ICD-10-CM

## 2020-11-15 MED ORDER — MIRTAZAPINE 15 MG PO TABS: 7.5000 mg | ORAL_TABLET | Freq: Every day | ORAL | 1 refills | Status: DC

## 2020-11-15 NOTE — Patient Instructions (Signed)
Visit Information  Phone number for Pharmacist: [phone]   Goals Addressed             This Visit's Progress    Manage My Medicine       Timeframe:  Long-Range Goal Priority:  Medium Start Date:       11/15/20                      Expected End Date:   11/15/21                    Follow Up Date Dec 2022   - call for medicine refill 2 or 3 days before it runs out - call if I am sick and can't take my medicine - keep a list of all the medicines I take; vitamins and herbals too - use a pillbox to sort medicine  -check blood pressure 2-3x weekly, document, and provide at future appointments -engage in dietary modifications by increasing hydration   Why is this important?   These steps will help you keep on track with your medicines.   Notes:          Patient verbalizes understanding of instructions provided today and agrees to view in [place].  Telephone follow up appointment with pharmacy team member scheduled for: 6 months  Charlene Brooke, PharmD, [place], CPP Clinical Pharmacist [facility] [phone]

## 2020-11-15 NOTE — Progress Notes (Signed)
Chronic Care Management Pharmacy Note  11/15/2020 Name:  Brooke [name] MRN:  [id number] DOB:  Oct 05, 1950  Summary: -Pt reports feeling much better now since husband's death; she is engaging in church activities and with grandchildren -Pt did well with mirtazapine and would like it refilled  Recommendations/Changes made from today's visit: -Refilled mirtazapine to pharmacy -Advised adequate hydration and monitoring BP at home   Subjective: Brooke [name] is an 70 y.o. year old female who is a primary patient of Burns, Claudina Lick, MD.  The CCM team was consulted for assistance with disease management and care coordination needs.    Engaged with patient by telephone for follow up visit in response to provider referral for pharmacy case management and/or care coordination services.   Consent to Services:  The patient was given information about Chronic Care Management services, agreed to services, and gave verbal consent prior to initiation of services.  Please see initial visit note for detailed documentation.   Patient Care Team: Binnie Rail, MD as PCP - General (Internal Medicine) Charlton Haws, [facility] as Pharmacist (Pharmacist)  Recent office visits: 09/20/20 Dr Quay Burow OV: c/o DOE, heart pounding. Referred to cardiology.  09/04/20 Dr Quay Burow OV: c/o grieving, insomnia. Husband recently died. D/C trazodone, start mirtazapine 7.5-15 mg HS  08/14/20 Dr Quay Burow OV: c/o sleep difficulties - rx'd trazodone  Recent consult visits: 10/05/20 Dr Acie Fredrickson (cardiology): eval SOB, palpitations. PO input has been irregular since husband's death. Advised to check BP, stay hydrated.  Hospital visits: None in previous 6 months   Objective:  Lab Results  Component Value Date   CREATININE 1.05 09/20/2020   BUN 22 09/20/2020   GFR 53.49 (L) 09/20/2020   GFRNONAA 88 (L) 04/27/2012   GFRAA >90 04/27/2012   NA 141 09/20/2020   K 3.7 09/20/2020   CALCIUM 10.4 09/20/2020   CO2 30 09/20/2020    GLUCOSE 93 09/20/2020    Lab Results  Component Value Date/Time   HGBA1C 6.2 07/12/2019 02:29 PM   HGBA1C 6.1 10/08/2018 09:21 AM   GFR 53.49 (L) 09/20/2020 09:56 AM   GFR 62.65 07/12/2019 02:29 PM   MICROALBUR 24.2 (H) 03/19/2009 09:36 AM    Last diabetic Eye exam:  Lab Results  Component Value Date/Time   HMDIABEYEEXA No Retinopathy 01/15/2018 12:00 AM    Last diabetic Foot exam: No results found for: HMDIABFOOTEX   Lab Results  Component Value Date   CHOL 145 07/12/2019   HDL 54.50 07/12/2019   LDLCALC 71 07/12/2019   LDLDIRECT 162.0 08/05/2012   TRIG 100.0 07/12/2019   CHOLHDL 3 07/12/2019    Hepatic Function Latest Ref Rng & Units 09/20/2020 07/12/2019 10/08/2018  Total Protein 6.0 - 8.3 g/dL 7.2 7.2 7.6  Albumin 3.5 - 5.2 g/dL 3.7 3.8 4.1  AST 0 - 37 U/L _0 ALT 0 - 35 U/L _1 Alk Phosphatase 39 - 117 U/L 84 81 93  Total Bilirubin 0.2 - 1.2 mg/dL 0.7 0.6 0.7  Bilirubin, Direct 0.0 - 0.3 mg/dL - - -    Lab Results  Component Value Date/Time   TSH 2.03 07/12/2019 02:29 PM   TSH 1.86 10/08/2018 09:21 AM    CBC Latest Ref Rng & Units 09/20/2020 07/12/2019 10/08/2018  WBC 4.0 - 10.5 K/uL 8.2 7.5 6.5  Hemoglobin 12.0 - 15.0 g/dL 11.6(L) 11.8(L) 12.4  Hematocrit 36.0 - 46.0 % 36.6 37.3 38.6  Platelets 150.0 - 400.0 K/uL 201.0 194.0 193.0  No results found for: VD25OH  Clinical ASCVD: No  The 10-year ASCVD risk score Mikey Bussing DC Jr., et al., 2013) is: 7.4%   Values used to calculate the score:     Age: 51 years     Sex: Female     Is Non-Hispanic African American: Yes     Diabetic: No     Tobacco smoker: No     Systolic Blood Pressure: 478 mmHg     Is BP treated: Yes     HDL Cholesterol: 54.5 mg/dL     Total Cholesterol: 145 mg/dL    Depression screen [facility] 2/9 09/04/2020 07/12/2019 10/08/2018  Decreased Interest 3 0 0  Down, Depressed, Hopeless 3 0 0  PHQ - 2 Score 6 0 0  Altered sleeping 3 - -  Tired, decreased energy 3 - -  Change in appetite 3 -  -  Feeling bad or failure about yourself  2 - -  Trouble concentrating 3 - -  Moving slowly or fidgety/restless 3 - -  Suicidal thoughts 0 - -  PHQ-9 Score 23 - -  Difficult doing work/chores Very difficult - -     Social History   Tobacco Use  Smoking Status Never  Smokeless Tobacco Never   BP Readings from Last 3 Encounters:  10/05/20 112/66  09/20/20 130/68  09/04/20 136/70   Pulse Readings from Last 3 Encounters:  10/05/20 78  09/20/20 92  09/04/20 82   Wt Readings from Last 3 Encounters:  10/05/20 153 lb 9.6 oz (69.7 kg)  09/20/20 151 lb (68.5 kg)  09/04/20 151 lb (68.5 kg)   BMI Readings from Last 3 Encounters:  10/05/20 29.41 kg/m  09/20/20 29.00 kg/m  09/04/20 29.00 kg/m    Assessment/Interventions: Review of patient past medical history, allergies, medications, health status, including review of consultants reports, laboratory and other test data, was performed as part of comprehensive evaluation and provision of chronic care management services.   SDOH:  (Social Determinants of Health) assessments and interventions performed: Yes  SDOH Screenings   Alcohol Screen: Not on file  Depression (PHQ2-9): Medium Risk   PHQ-2 Score: 23  Financial Resource Strain: Low Risk    Difficulty of Paying Living Expenses: Not hard at all  Food Insecurity: Not on file  Housing: Not on file  Physical Activity: Not on file  Social Connections: Not on file  Stress: Not on file  Tobacco Use: Low Risk    Smoking Tobacco Use: Never   Smokeless Tobacco Use: Never  Transportation Needs: Not on file    [place]  Allergies  Allergen Reactions   Penicillins Hives    Medications Reviewed Today     Reviewed by Charlton Haws, [facility] (Pharmacist) on 11/15/20 at 1644  Med List Status: <None>   Medication Order Taking? Sig Documenting Provider Last Dose Status Informant  amLODipine (NORVASC) 10 MG tablet [id number] Yes Take 1 tablet by mouth once daily Binnie Rail, MD Taking Active   aspirin 81 MG chewable tablet [id number] Yes Chew 81 mg by mouth daily. [provider] Taking Active Self  atenolol (TENORMIN) 25 MG tablet [id number] Yes Take 1 tablet by mouth once daily Burns, Claudina Lick, MD Taking Active   atorvastatin (LIPITOR) 20 MG tablet [id number] Yes Take 1 tablet by mouth once daily Burns, Claudina Lick, MD Taking Active   mirtazapine (REMERON) 15 MG tablet [id number] Yes Take 0.5-1 tablets (7.5-15 mg total) by mouth at bedtime. Binnie Rail, MD Taking  Active     Discontinued 08/01/19 1327   potassium chloride (KLOR-CON) 10 MEQ tablet [id number] Yes TAKE 1 TABLET BY MOUTH THREE TIMES DAILY Burns, Claudina Lick, MD Taking Active   triamterene-hydrochlorothiazide [facility]) 37.5-25 MG tablet [id number] Yes Take 1 tablet by mouth once daily Binnie Rail, MD Taking Active             Patient Active Problem List   Diagnosis Date Noted   Palpitations 10/05/2020   DOE (dyspnea on exertion) 09/20/2020   Grieving 09/04/2020   Rash and nonspecific skin eruption 12/05/2019   GERD (gastroesophageal reflux disease) 06/01/2019   Hot flashes 10/08/2018   Chest tightness 08/24/2018   Pain of left heel 01/11/2018   Sleep difficulties 04/15/2016   Prediabetes 05/07/2015   Memory difficulties 05/07/2015   Bilateral leg edema 05/07/2015   Anemia 10/12/2013   FH: CAD (coronary artery disease) 04/27/2012   OSTEOARTHRITIS 03/30/2008   Hyperlipidemia 07/22/2007   OBESITY NOS 02/22/2007   Allergic rhinitis 02/22/2007   Essential hypertension 02/18/2007    Immunization History  Administered Date(s) Administered   Fluad Quad(high Dose 65+) 03/10/2019   Influenza Whole 04/17/2009, 05/08/2010   Influenza, High Dose Seasonal PF 03/30/2015, 04/15/2016, 04/17/2017, 04/21/2018, 03/05/2019, 03/09/2020   Moderna Sars-Covid-2 Vaccination 07/02/2019, 08/06/2019   Pneumococcal Conjugate-13 04/21/2018   Pneumococcal Polysaccharide-23 08/05/2012, 06/01/2019    Td 06/09/1997, 03/30/2008   Zoster, Live 10/12/2013    Conditions to be addressed/monitored:  Hypertension, Hyperlipidemia, Chronic Kidney Disease, and Insomnia/depression  Care Plan : [place]  Updates made by Charlton Haws, [place] since 11/15/2020 12:00 AM     Problem: Hypertension, Hyperlipidemia, Chronic Kidney Disease, and Insomnia/depression   Priority: High     Long-Range Goal: Disease management   Start Date: 11/15/2020  Expected End Date: 11/15/2021  This Visit's Progress: On track  Priority: High  Note:   Current Barriers:  Unable to independently monitor therapeutic efficacy  Does not request refills   Pharmacist Clinical Goal(s):  Patient will achieve adherence to monitoring guidelines and medication adherence to achieve therapeutic efficacy Refill medications through collaboration with PharmD and provider.   Interventions: 1:1 collaboration with Binnie Rail, MD regarding development and update of comprehensive plan of care as evidenced by provider attestation and co-signature Inter-disciplinary care team collaboration (see longitudinal plan of care) Comprehensive medication review performed; medication list updated in electronic medical record  Hypertension    BP goal is:  <130/80  Patient checks BP at home infrequently Patient home BP readings are ranging: n/a   Patient has failed these meds in the past: n/a Patient is currently controlled on the following medications: Amlodipine 10 mg daily AM Atenolol 25 mg daily Triamterene-HCTZ 37.5-25 mg daily    We discussed: recent visit to cardiology - pt was advised to monitor BP and maintain adequate hydration; pt denies further episodes and believes it was related to stress of her husband's passing;    Plan: Continue current medications; maintain adequate hydration   Hyperlipidemia    LDL goal < 100  Patient has failed these meds in past: n/a Patient is currently controlled on the  following medications: Atorvastatin 20 mg daily Aspirin 81 mg daily   We discussed:  diet and exercise extensively; Cholesterol goals; benefits of statin for ASCVD risk reduction   Plan: Continue current medications and control with diet and exercise   Depression / Insomnia     Patient has failed these meds in past: trazodone Patient is currently uncontrolled on  the following medications: Mirtazapine 15 mg 1/2-1 tab HS   We discussed:  Pt reports mirtazapine helped a lot with sleep/mood, she was feeling much better but ran out of medication some time ago and thought she could not get more refills; pt would like to continue medication; advised pt it ok to refill med and sent refill to pharmacy   Plan Continue current medications; refill sent to [facility]  Patient Goals/Self-Care Activities Patient will:  - take medications as prescribed focus on medication adherence by pill box check blood pressure 2-3x weekly, document, and provide at future appointments engage in dietary modifications by increasing hydration      Medication Assistance: None required.  Patient affirms current coverage meets needs.  Compliance/Adherence/Medication fill history: Care Gaps: Shingrix TDAP (due 03/30/18) Covid booster (due 01/03/20)  Star-Rating Drugs: Atorvastatin - LF 07/31/20 x 90 ds   Patient's preferred pharmacy is:  [facility]  [id number] [place], [place] - [address] [facility] [address] Phone: [phone] Fax: [phone]  Uses pill box? Yes Pt endorses 100% compliance  We discussed: Current pharmacy is preferred with insurance plan and patient is satisfied with pharmacy services Patient decided to: Continue current medication management strategy  Care Plan and Follow Up Patient Decision:  Patient agrees to Care Plan and Follow-up.  Plan: Telephone follow up appointment with care management team member scheduled for:  6  months  Charlene Brooke, PharmD, [place], CPP Clinical Pharmacist [facility] [phone]

## 2020-11-16 ENCOUNTER — Other Ambulatory Visit: Payer: Self-pay | Admitting: Internal Medicine

## 2020-12-05 ENCOUNTER — Telehealth: Payer: Self-pay | Admitting: *Deleted

## 2020-12-05 NOTE — Telephone Encounter (Signed)
PLEASE NOTE: All timestamps contained within this report are represented as [place] Standard Time. CONFIDENTIALTY NOTICE: This fax transmission is intended only for the addressee. It contains information that is legally privileged, confidential or otherwise protected from use or disclosure. If you are not the intended recipient, you are strictly prohibited from reviewing, disclosing, copying using or disseminating any of this information or taking any action in reliance on or regarding this information. If you have received this fax in error, please notify us immediately by telephone so that we can arrange for its return to [place]. Phone: [phone], Toll-Free: [phone], Fax: [phone] Page: 1 of 1 Call Id: [id number] [place] Night - Client Nonclinical Telephone Record  [facility] Client [place] Night - Client Client Site [place] [facility] [place] - PHYSICIAN, NOT LISTED- MD Contact Type Call Who Is Calling Patient / Member / Family / Caregiver Caller Name [place] Phone Number [phone] Patient Name Brooke [name] Patient DOB 11/14/1950 Call Type Message Only Information Provided Reason for Call Request to Schedule Office Appointment Initial Comment Needs to make same day appt. Pls call back Patient request to speak to RN No Disp. Time Disposition Final User 12/05/2020 7:59:57 AM General Information Provided Yes Donato Heinz Call Closed By: Donato Heinz Transaction Date/Time: 12/05/2020 7:58:18 AM (ET)

## 2020-12-05 NOTE — Telephone Encounter (Signed)
LVM instructing pt to return call re: call to triage line last pm.  Also called mobile, however, mailbox full.  Will also send mychart message.

## 2020-12-11 ENCOUNTER — Other Ambulatory Visit: Payer: Self-pay | Admitting: Internal Medicine

## 2020-12-19 IMAGING — MG DIGITAL SCREENING BILAT W/ TOMO W/ CAD
6 of 12 series · 6 of 36 positions shown · non-contrast
Comparison: Previous exam(s).

CLINICAL DATA: Screening.

EXAM:
DIGITAL SCREENING BILATERAL MAMMOGRAM WITH TOMO AND CAD

[R MLO synth-2D (1 of 2)]
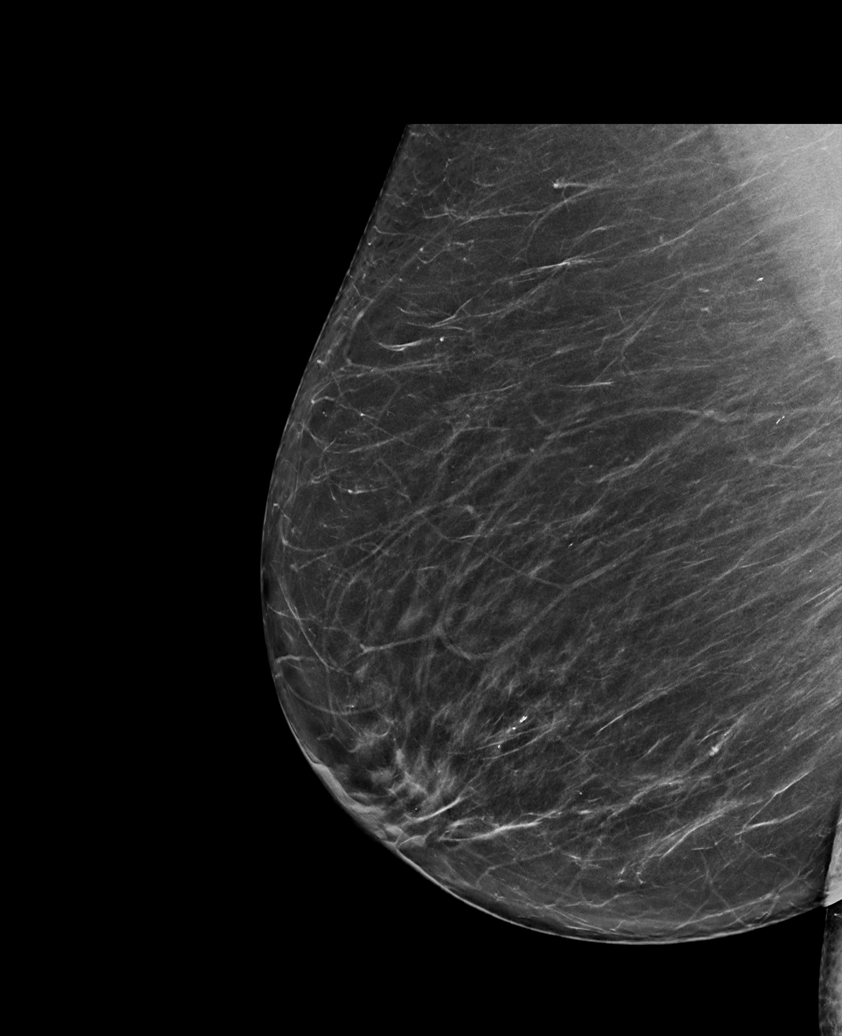

[L MLO synth-2D]
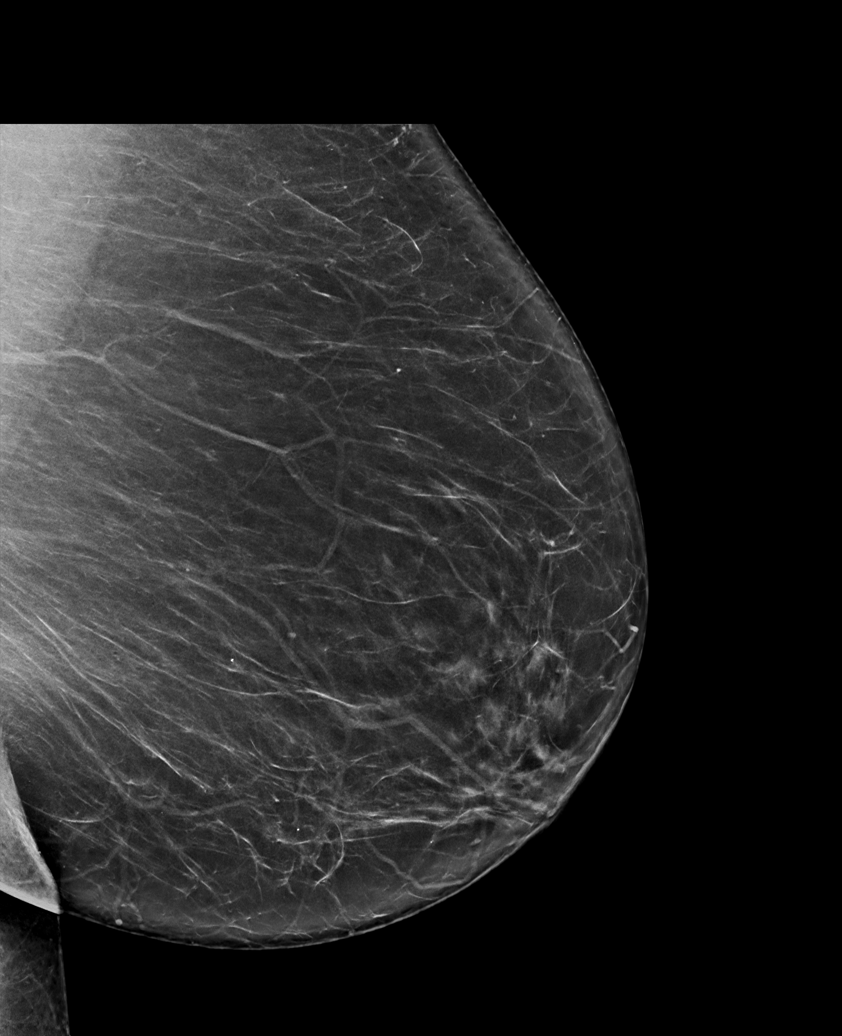

[R MLO synth-2D (2 of 2)]
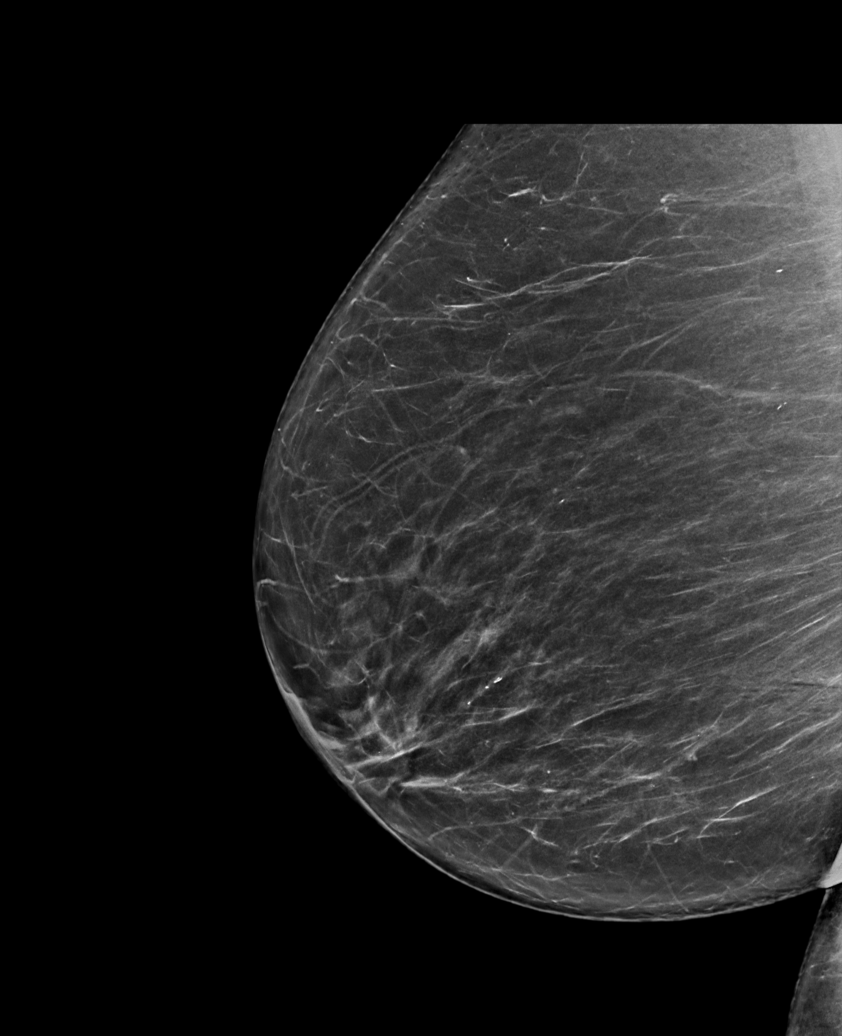

[L CC synth-2D]
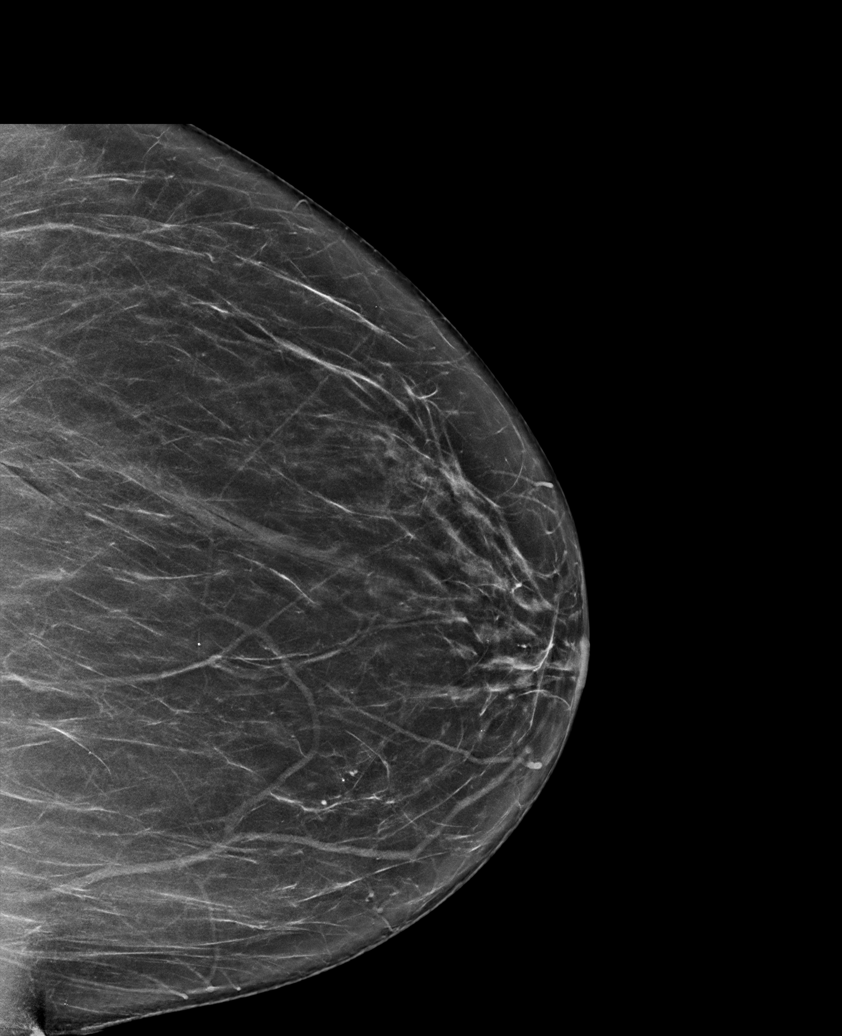

[R CV synth-2D]
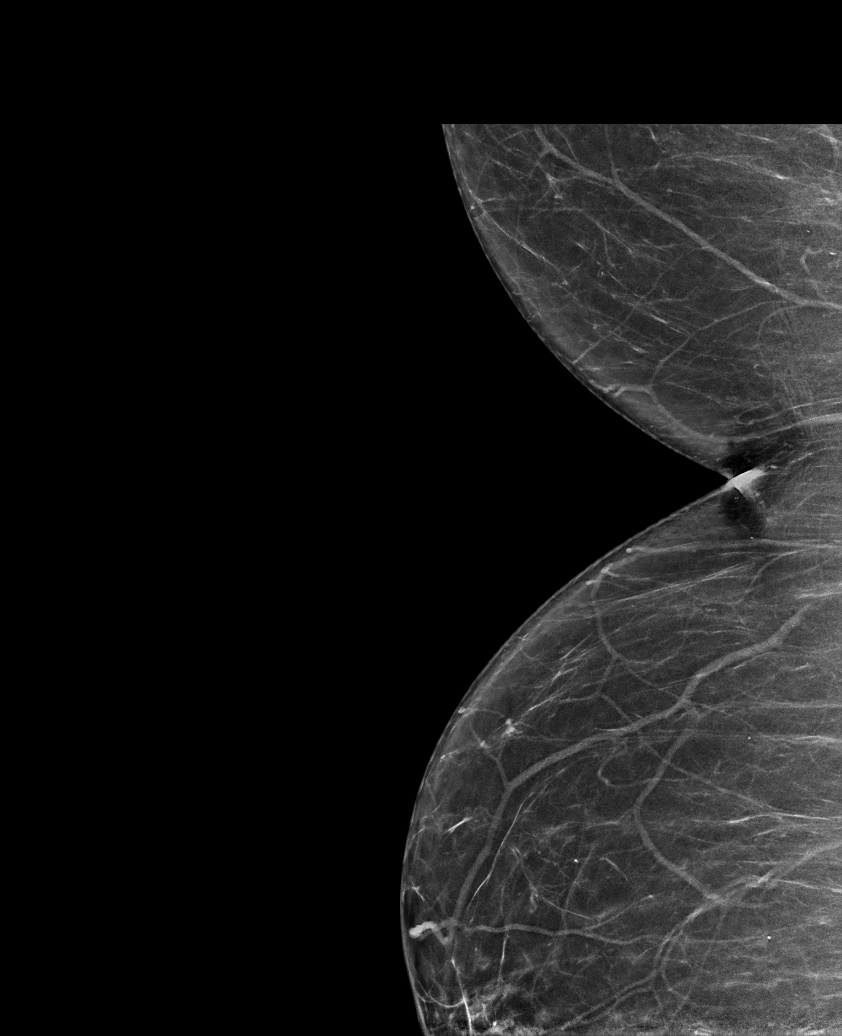

[R CC synth-2D]
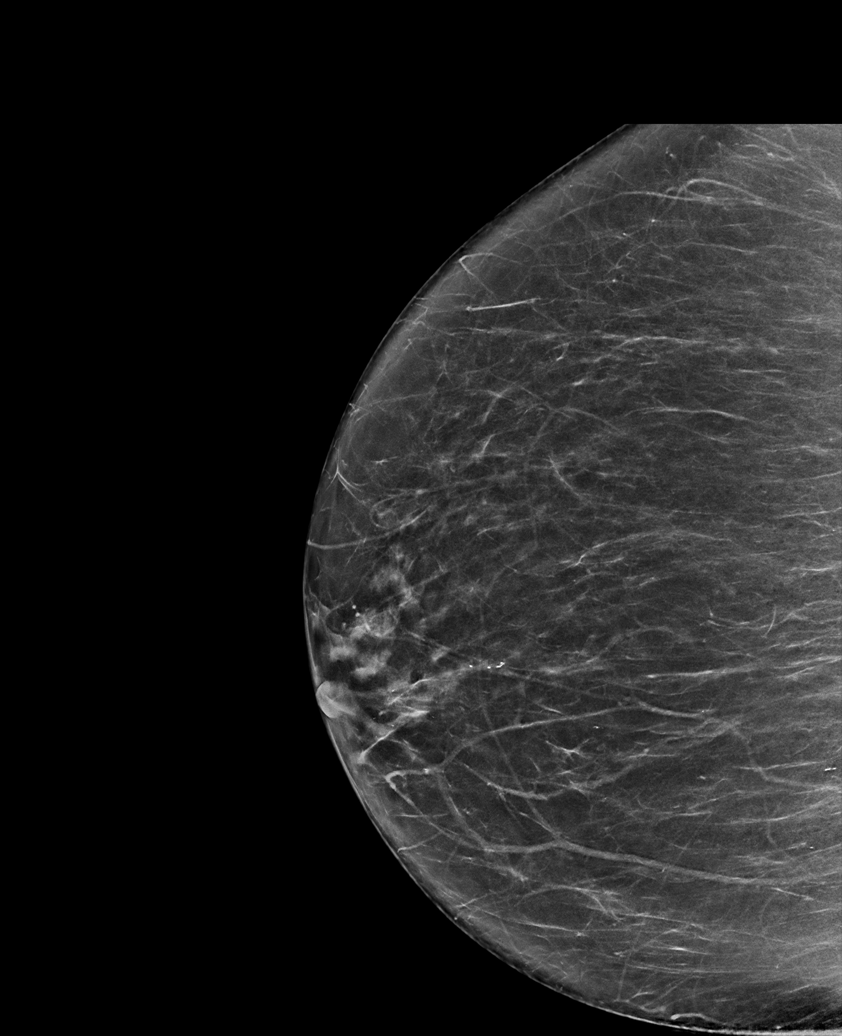

[6 of 36 positions shown; findings below may reference images not displayed]

ACR Breast Density Category b: There are scattered areas of
fibroglandular density.
FINDINGS: There are no findings suspicious for malignancy. Images were
processed with CAD.
IMPRESSION: No mammographic evidence of malignancy. A result letter of this
screening mammogram will be mailed directly to the patient.

RECOMMENDATION:
Screening mammogram in one year. (Code:CN-U-775)

BI-RADS CATEGORY  1: Negative.

## 2021-01-31 ENCOUNTER — Ambulatory Visit: Payer: Medicare Other | Admitting: Internal Medicine

## 2021-02-13 ENCOUNTER — Telehealth: Payer: Self-pay | Admitting: Pharmacist

## 2021-02-13 NOTE — Progress Notes (Signed)
Chronic Care Management Pharmacy Assistant   Name: Brooke [name]  MRN: [id number] DOB: Jun 30, 1950   Reason for Encounter: Disease State   Conditions to be addressed/monitored: HTN   Recent office visits:  None ID  Recent consult visits:  None ID  Hospital visits:  None in previous 6 months  Medications: Outpatient Encounter Medications as of 02/13/2021  Medication Sig   amLODipine (NORVASC) 10 MG tablet Take 1 tablet by mouth once daily   aspirin 81 MG chewable tablet Chew 81 mg by mouth daily.   atenolol (TENORMIN) 25 MG tablet Take 1 tablet by mouth once daily   atorvastatin (LIPITOR) 20 MG tablet Take 1 tablet by mouth once daily   mirtazapine (REMERON) 15 MG tablet Take 0.5-1 tablets (7.5-15 mg total) by mouth at bedtime.   potassium chloride (KLOR-CON) 10 MEQ tablet TAKE 1 TABLET BY MOUTH THREE TIMES DAILY   triamterene-hydrochlorothiazide (MAXZIDE-25) 37.5-25 MG tablet Take 1 tablet by mouth once daily   [DISCONTINUED] potassium chloride (KLOR-CON M10) 10 MEQ tablet Take 1 tablet (10 mEq total) by mouth three times daily.   No facility-administered encounter medications on file as of 02/13/2021.    Recent Office Vitals: BP Readings from Last 3 Encounters:  10/05/20 112/66  09/20/20 130/68  09/04/20 136/70   Pulse Readings from Last 3 Encounters:  10/05/20 78  09/20/20 92  09/04/20 82    Wt Readings from Last 3 Encounters:  10/05/20 153 lb 9.6 oz (69.7 kg)  09/20/20 151 lb (68.5 kg)  09/04/20 151 lb (68.5 kg)     Kidney Function Lab Results  Component Value Date/Time   CREATININE 1.05 09/20/2020 09:56 AM   CREATININE 1.05 07/12/2019 02:29 PM   GFR 53.49 (L) 09/20/2020 09:56 AM   GFRNONAA 88 (L) 04/27/2012 12:13 PM   GFRAA >90 04/27/2012 12:13 PM    BMP Latest Ref Rng & Units 09/20/2020 07/12/2019 10/08/2018  Glucose 70 - 99 mg/dL 93 95 89  BUN 6 - 23 mg/dL '22 13 16  '$ Creatinine 0.40 - 1.20 mg/dL 1.05 1.05 0.89  Sodium 135 - 145 mEq/L 141 138 140   Potassium 3.5 - 5.1 mEq/L 3.7 3.9 3.7  Chloride 96 - 112 mEq/L 104 103 102  CO2 19 - 32 mEq/L '30 28 28  '$ Calcium 8.4 - 10.5 mg/dL 10.4 10.0 9.8     Contacted patient on 02/13/21 to discuss hypertension disease state  Current antihypertensive regimen:  Amlodipine 10 mg daily AM Atenolol 25 mg daily Triamterene-HCTZ 37.5-25 mg daily   Patient verbally confirms she is taking the above medications as directed. Yes  How often are you checking your Blood Pressure?  Patient states that sh does not check blood pressure at home but occassionally goes to [facility] or [facility] to check it  Caffeine intake:Patient states that she sometimes have a soda or two, she drinks coffee in the fall or winter Salt intake:Patient states she uses very little salt OTC medications including pseudoephedrine or NSAIDs?None noted  What recent interventions/DTPs have been made by any provider to improve Blood Pressure control since last CPP Visit:None ID  Any recent hospitalizations or ED visits since last visit with CPP? No  What diet changes have been made to improve Blood Pressure Control?  Patient states she has not made any changes to diet  What exercise is being done to improve your Blood Pressure Control?  Patient states that she goes to the [facility] everyday for exercise  Adherence Review: Is the patient currently  on ACE/ARB medication? No Does the patient have >5 day gap between last estimated fill dates? No   Star Rating Drugs:  Medication:  Last Fill: Day Supply Atorvastatin 20 mg 11/16/20 90   Care Gaps: Last annual wellness visit:07/12/19   CCM appointment on 05/10/21     Ethelene Hal Clinical Pharmacist Assistant [phone]   Time spent:30

## 2021-02-20 ENCOUNTER — Other Ambulatory Visit: Payer: Self-pay | Admitting: Internal Medicine

## 2021-02-22 ENCOUNTER — Other Ambulatory Visit: Payer: Self-pay | Admitting: Internal Medicine

## 2021-03-14 ENCOUNTER — Other Ambulatory Visit: Payer: Self-pay | Admitting: Internal Medicine

## 2021-03-24 NOTE — Patient Instructions (Addendum)
Flu immunization administered today.     Get covid vaccine at the pharmacy.     Blood work was ordered.     Medications changes include :   start daily sertraline 50 mg daily.  Start lunesta 2 mg at night.  Stop the mirtazapine.    Your prescription(s) have been submitted to your pharmacy. Please take as directed and contact our office if you believe you are having problem(s) with the medication(s).   Please followup in 1 month   Health Maintenance, Female Adopting a healthy lifestyle and getting preventive care are important in promoting health and wellness. Ask your health care provider about: The right schedule for you to have regular tests and exams. Things you can do on your own to prevent diseases and keep yourself healthy. What should I know about diet, weight, and exercise? Eat a healthy diet  Eat a diet that includes plenty of vegetables, fruits, low-fat dairy products, and lean protein. Do not eat a lot of foods that are high in solid fats, added sugars, or sodium. Maintain a healthy weight Body mass index (BMI) is used to identify weight problems. It estimates body fat based on height and weight. Your health care provider can help determine your BMI and help you achieve or maintain a healthy weight. Get regular exercise Get regular exercise. This is one of the most important things you can do for your health. Most adults should: Exercise for at least 150 minutes each week. The exercise should increase your heart rate and make you sweat (moderate-intensity exercise). Do strengthening exercises at least twice a week. This is in addition to the moderate-intensity exercise. Spend less time sitting. Even light physical activity can be beneficial. Watch cholesterol and blood lipids Have your blood tested for lipids and cholesterol at 72 years of age, then have this test every 5 years. Have your cholesterol levels checked more often if: Your lipid or cholesterol levels are  high. You are older than 72 years of age. You are at high risk for heart disease. What should I know about cancer screening? Depending on your health history and family history, you may need to have cancer screening at various ages. This may include screening for: Breast cancer. Cervical cancer. Colorectal cancer. Skin cancer. Lung cancer. What should I know about heart disease, diabetes, and high blood pressure? Blood pressure and heart disease High blood pressure causes heart disease and increases the risk of stroke. This is more likely to develop in people who have high blood pressure readings, are of African descent, or are overweight. Have your blood pressure checked: Every 3-5 years if you are 41-38 years of age. Every year if you are 92 years old or older. Diabetes Have regular diabetes screenings. This checks your fasting blood sugar level. Have the screening done: Once every three years after age 60 if you are at a normal weight and have a low risk for diabetes. More often and at a younger age if you are overweight or have a high risk for diabetes. What should I know about preventing infection? Hepatitis B If you have a higher risk for hepatitis B, you should be screened for this virus. Talk with your health care provider to find out if you are at risk for hepatitis B infection. Hepatitis C Testing is recommended for: Everyone born from [birth year] through 1965. Anyone with known risk factors for hepatitis C. Sexually transmitted infections (STIs) Get screened for STIs, including gonorrhea and chlamydia, if: You are  sexually active and are younger than 72 years of age. You are older than 72 years of age and your health care provider tells you that you are at risk for this type of infection. Your sexual activity has changed since you were last screened, and you are at increased risk for chlamydia or gonorrhea. Ask your health care provider if you are at risk. Ask your health care  provider about whether you are at high risk for HIV. Your health care provider may recommend a prescription medicine to help prevent HIV infection. If you choose to take medicine to prevent HIV, you should first get tested for HIV. You should then be tested every 3 months for as long as you are taking the medicine. Pregnancy If you are about to stop having your period (premenopausal) and you may become pregnant, seek counseling before you get pregnant. Take 400 to 800 micrograms (mcg) of folic acid every day if you become pregnant. Ask for birth control (contraception) if you want to prevent pregnancy. Osteoporosis and menopause Osteoporosis is a disease in which the bones lose minerals and strength with aging. This can result in bone fractures. If you are 60 years old or older, or if you are at risk for osteoporosis and fractures, ask your health care provider if you should: Be screened for bone loss. Take a calcium or vitamin D supplement to lower your risk of fractures. Be given hormone replacement therapy (HRT) to treat symptoms of menopause. Follow these instructions at home: Lifestyle Do not use any products that contain nicotine or tobacco, such as cigarettes, e-cigarettes, and chewing tobacco. If you need help quitting, ask your health care provider. Do not use street drugs. Do not share needles. Ask your health care provider for help if you need support or information about quitting drugs. Alcohol use Do not drink alcohol if: Your health care provider tells you not to drink. You are pregnant, may be pregnant, or are planning to become pregnant. If you drink alcohol: Limit how much you use to 0-1 drink a day. Limit intake if you are breastfeeding. Be aware of how much alcohol is in your drink. In the [place] one drink equals one 12 oz bottle of beer (355 mL), one 5 oz glass of wine (148 mL), or one 1 oz glass of hard liquor (44 mL). General instructions Schedule regular health,  dental, and eye exams. Stay current with your vaccines. Tell your health care provider if: You often feel depressed. You have ever been abused or do not feel safe at home. Summary Adopting a healthy lifestyle and getting preventive care are important in promoting health and wellness. Follow your health care provider's instructions about healthy diet, exercising, and getting tested or screened for diseases. Follow your health care provider's instructions on monitoring your cholesterol and blood pressure. This information is not intended to replace advice given to you by your health care provider. Make sure you discuss any questions you have with your health care provider. Document Revised: 08/03/2020 Document Reviewed: 05/19/2018 Elsevier Patient Education  2022 [employer].

## 2021-03-24 NOTE — Progress Notes (Signed)
Subjective:    Patient ID: Brooke [name], female    DOB: 06-04-1951, 70 y.o.   MRN: [id number]   This visit occurred during the SARS-CoV-2 public health emergency.  Safety protocols were in place, including screening questions prior to the visit, additional usage of staff PPE, and extensive cleaning of exam room while observing appropriate contact time as indicated for disinfecting solutions.    HPI She is here for a physical exam.   She continues to not sleep well.  Her husband died about 6 months ago and that is when her sleep issues started.  She is taking the mirtazapine, but that has not seemed to help.  She thinks it may have helped slightly with her mood.  She has not seen a therapist yet.  She has no other concerns.  Medications and allergies reviewed with patient and updated if appropriate.  Patient Active Problem List   Diagnosis Date Noted   Palpitations 10/05/2020   DOE (dyspnea on exertion) 09/20/2020   Grieving 09/04/2020   Rash and nonspecific skin eruption 12/05/2019   GERD (gastroesophageal reflux disease) 06/01/2019   Hot flashes 10/08/2018   Chest tightness 08/24/2018   Pain of left heel 01/11/2018   Sleep difficulties 04/15/2016   Prediabetes 05/07/2015   Memory difficulties 05/07/2015   Bilateral leg edema 05/07/2015   Anemia 10/12/2013   FH: CAD (coronary artery disease) 04/27/2012   OSTEOARTHRITIS 03/30/2008   Hyperlipidemia 07/22/2007   OBESITY NOS 02/22/2007   Allergic rhinitis 02/22/2007   Essential hypertension 02/18/2007    Current Outpatient Medications on File Prior to Visit  Medication Sig Dispense Refill   amLODipine (NORVASC) 10 MG tablet Take 1 tablet by mouth once daily 90 tablet 0   aspirin 81 MG chewable tablet Chew 81 mg by mouth daily.     atenolol (TENORMIN) 25 MG tablet Take 1 tablet by mouth once daily 90 tablet 0   atorvastatin (LIPITOR) 20 MG tablet Take 1 tablet by mouth once daily 90 tablet 0   potassium chloride  (KLOR-CON) 10 MEQ tablet TAKE 1 TABLET BY MOUTH THREE TIMES DAILY 270 tablet 0   triamterene-hydrochlorothiazide (MAXZIDE-25) 37.5-25 MG tablet Take 1 tablet by mouth once daily 90 tablet 0   [DISCONTINUED] potassium chloride (KLOR-CON M10) 10 MEQ tablet Take 1 tablet (10 mEq total) by mouth three times daily. 90 tablet 0   No current facility-administered medications on file prior to visit.    Past Medical History:  Diagnosis Date   FH: CAD (coronary artery disease)    High cholesterol    Hypertension    Memory loss    Obesity     Past Surgical History:  Procedure Laterality Date   CARPAL TUNNEL RELEASE     bilateral   HIP SURGERY      Social History   Socioeconomic History   Marital status: Married    Spouse name: Not on file   Number of children: 2   Years of education: 12th    Highest education level: Not on file  Occupational History   Occupation: Psychologist, sport and exercise for [employer]    Comment: Laid Off  Tobacco Use   Smoking status: Never   Smokeless tobacco: Never  Substance and Sexual Activity   Alcohol use: No   Drug use: No   Sexual activity: Not on file  Other Topics Concern   Not on file  Social History Narrative   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        Social Determinants of Health   Financial Resource Strain: Not on file  Food Insecurity: Not on file  Transportation Needs: Not on file  Physical Activity: Not on file  Stress: Not on file  Social Connections: Not on file    Family History  Problem Relation Age of Onset   CAD Sister    CAD Father    Kidney disease Sister    Lung cancer Mother    Brain cancer Mother    Colon cancer Neg Hx    Stomach cancer Neg Hx    Pancreatic cancer Neg Hx    Esophageal cancer Neg Hx    Colon polyps Neg Hx    Rectal cancer Neg Hx     Review of Systems  Constitutional:  Positive for appetite change (decreased) and unexpected weight change. Negative for chills  and fever.  Eyes:  Negative for visual disturbance.  Respiratory:  Negative for cough, shortness of breath and wheezing.   Cardiovascular:  Negative for chest pain, palpitations and leg swelling.  Gastrointestinal:  Negative for abdominal pain, blood in stool, constipation, diarrhea and nausea.       No gerd  Genitourinary:  Negative for dysuria and hematuria.  Musculoskeletal:  Negative for arthralgias and back pain.  Skin:  Negative for rash (none- intermittent ezcema).  Neurological:  Negative for dizziness, light-headedness, numbness and headaches.  Psychiatric/Behavioral:  Positive for dysphoric mood and sleep disturbance. The patient is not nervous/anxious.       Objective:   Vitals:   03/25/21 1401  BP: 126/80  Pulse: 67  Temp: 98.3 F (36.8 C)  SpO2: 99%   Filed Weights   03/25/21 1401  Weight: 156 lb (70.8 kg)   Body mass index is 29.87 kg/m.  BP Readings from Last 3 Encounters:  03/25/21 126/80  10/05/20 112/66  09/20/20 130/68    Wt Readings from Last 3 Encounters:  03/25/21 156 lb (70.8 kg)  10/05/20 153 lb 9.6 oz (69.7 kg)  09/20/20 151 lb (68.5 kg)     Physical Exam Constitutional: She appears well-developed and well-nourished. No distress.  HENT:  Head: Normocephalic and atraumatic.  Right Ear: External ear normal. Normal ear canal and TM Left Ear: External ear normal.  Normal ear canal and TM Mouth/Throat: Oropharynx is clear and moist.  Eyes: Conjunctivae and EOM are normal.  Neck: Neck supple. No tracheal deviation present. No thyromegaly present.  No carotid bruit  Cardiovascular: Normal rate, regular rhythm and normal heart sounds.   No murmur heard.  No edema. Pulmonary/Chest: Effort normal and breath sounds normal. No respiratory distress. She has no wheezes. She has no rales.  Breast: deferred   Abdominal: Soft. She exhibits no distension. There is no tenderness.  Lymphadenopathy: She has no cervical adenopathy.  Skin: Skin is warm and  dry. She is not diaphoretic.  Psychiatric: She has a normal mood and affect. Her behavior is normal.     Lab Results  Component Value Date   WBC 8.2 09/20/2020   HGB 11.6 (L) 09/20/2020   HCT 36.6 09/20/2020   PLT 201.0 09/20/2020   GLUCOSE 93 09/20/2020   CHOL 145 07/12/2019   TRIG 100.0 07/12/2019   HDL 54.50 07/12/2019   LDLDIRECT 162.0 08/05/2012   LDLCALC 71 07/12/2019   ALT 18 09/20/2020   AST 22 09/20/2020   NA 141 09/20/2020   K 3.7 09/20/2020   CL 104 09/20/2020   CREATININE 1.05 09/20/2020  BUN 22 09/20/2020   CO2 30 09/20/2020   TSH 2.03 07/12/2019   INR 1.9 (H) 04/15/2008   HGBA1C 6.2 07/12/2019   MICROALBUR 24.2 (H) 03/19/2009         Assessment & Plan:   Physical exam: Screening blood work  ordered Exercise  regular Weight  ok for age and current issues Substance abuse  none   Reviewed recommended immunizations.   Flu vaccine today.   Health Maintenance  Topic Date Due   INFLUENZA VACCINE  01/07/2021   COVID-19 Vaccine (3 - Booster for Moderna series) 04/10/2021 (Originally 01/03/2020)   Zoster Vaccines- Shingrix (1 of 2) 06/25/2021 (Originally 04/17/1999)   TETANUS/TDAP  03/25/2022 (Originally 03/30/2018)   MAMMOGRAM  09/20/2021   DEXA SCAN  10/02/2021   COLONOSCOPY (Pts 45-33yrs Insurance coverage will need to be confirmed)  10/09/2029   Hepatitis C Screening  Completed   HPV VACCINES  Aged Out          See Problem List for Assessment and Plan of chronic medical problems.

## 2021-03-25 ENCOUNTER — Ambulatory Visit (INDEPENDENT_AMBULATORY_CARE_PROVIDER_SITE_OTHER): Payer: Medicare Other | Admitting: Internal Medicine

## 2021-03-25 ENCOUNTER — Encounter: Payer: Self-pay | Admitting: Internal Medicine

## 2021-03-25 ENCOUNTER — Other Ambulatory Visit: Payer: Self-pay

## 2021-03-25 VITALS — BP 126/80 | HR 67 | Temp 98.3°F | Ht 60.6 in | Wt 156.0 lb

## 2021-03-25 DIAGNOSIS — I10 Essential (primary) hypertension: Secondary | ICD-10-CM

## 2021-03-25 DIAGNOSIS — L309 Dermatitis, unspecified: Secondary | ICD-10-CM | POA: Diagnosis not present

## 2021-03-25 DIAGNOSIS — R7303 Prediabetes: Secondary | ICD-10-CM | POA: Diagnosis not present

## 2021-03-25 DIAGNOSIS — Z0000 Encounter for general adult medical examination without abnormal findings: Secondary | ICD-10-CM | POA: Diagnosis not present

## 2021-03-25 DIAGNOSIS — E7849 Other hyperlipidemia: Secondary | ICD-10-CM

## 2021-03-25 DIAGNOSIS — D649 Anemia, unspecified: Secondary | ICD-10-CM

## 2021-03-25 DIAGNOSIS — F4321 Adjustment disorder with depressed mood: Secondary | ICD-10-CM

## 2021-03-25 DIAGNOSIS — Z23 Encounter for immunization: Secondary | ICD-10-CM

## 2021-03-25 DIAGNOSIS — G479 Sleep disorder, unspecified: Secondary | ICD-10-CM

## 2021-03-25 HISTORY — DX: Dermatitis, unspecified: L30.9

## 2021-03-25 MED ORDER — ESZOPICLONE 2 MG PO TABS: 2.0000 mg | ORAL_TABLET | Freq: Every evening | ORAL | 0 refills | Status: DC | PRN

## 2021-03-25 MED ORDER — SERTRALINE HCL 50 MG PO TABS: 50.0000 mg | ORAL_TABLET | Freq: Every day | ORAL | 3 refills | Status: DC

## 2021-03-25 NOTE — Assessment & Plan Note (Signed)
Chronic Controlled 

## 2021-03-25 NOTE — Assessment & Plan Note (Signed)
Chronic Check a1c Low sugar / carb diet Stressed regular exercise  

## 2021-03-25 NOTE — Assessment & Plan Note (Signed)
Chronic °Regular exercise and healthy diet encouraged °Check lipid panel  °Continue atorvastatin 20 mg daily °

## 2021-03-25 NOTE — Assessment & Plan Note (Signed)
History of iron deficiency anemia °Check CBC °

## 2021-03-25 NOTE — Assessment & Plan Note (Signed)
Subacute Her husband died approximately 6 months ago and she is experiencing some depression and difficulty sleeping-never had difficulty sleeping prior to this Mirtazapine not effective for sleep-has helped some with depression Will discontinue mirtazapine and start sertraline 50 mg daily Stressed that she needs to see a therapist and this is the only thing that will help her sleep and some of her depression

## 2021-03-25 NOTE — Assessment & Plan Note (Addendum)
Chronic - since her husband died Trazodone, mirtazapine not effective She is exercising Advised seeing a therapist for her grief Discussed good sleep hygiene Difficulty falling asleep and staying asleep-discontinue mirtazapine Start Lunesta 2 mg nightly Will separately treat depression with sertraline 50 mg daily

## 2021-03-25 NOTE — Assessment & Plan Note (Signed)
Chronic BP well controlled Continue amlodipine 10 mg daily, atenolol 25 mg daily, maxzide 37.5-25 mg daily cmp

## 2021-04-03 ENCOUNTER — Telehealth: Payer: Self-pay

## 2021-04-03 NOTE — Progress Notes (Signed)
    Chronic Care Management Pharmacy Assistant   Name: Brooke [name]  MRN: [id number] DOB: Dec 23, 1950   Reason for Encounter: Medication Adherence   Reviewed chart for medication changes and adherence. Patient continues to take Atorvastatin 20 mg prescribed by Dr. Quay Burow last filled at [facility] on 02/20/21 90 ds.  No gaps in adherence identified.   [place] Pharmacist Assistant [phone]

## 2021-04-25 NOTE — Progress Notes (Deleted)
Subjective:    Patient ID: Brooke [name], female    DOB: [birth year], 70 y.o.   MRN: [id number]  This visit occurred during the SARS-CoV-2 public health emergency.  Safety protocols were in place, including screening questions prior to the visit, additional usage of staff PPE, and extensive cleaning of exam room while observing appropriate contact time as indicated for disinfecting solutions.     HPI The patient is here for follow up of their chronic medical problems, including insomnia and depression.  One month ago we started lunesta and sertraline - she is here for f/u.    Medications and allergies reviewed with patient and updated if appropriate.  Patient Active Problem List   Diagnosis Date Noted   Eczema 03/25/2021   Palpitations 10/05/2020   DOE (dyspnea on exertion) 09/20/2020   Grieving 09/04/2020   Rash and nonspecific skin eruption 12/05/2019   GERD (gastroesophageal reflux disease) 06/01/2019   Hot flashes 10/08/2018   Chest tightness 08/24/2018   Pain of left heel 01/11/2018   Sleep difficulties 04/15/2016   Prediabetes 05/07/2015   Memory difficulties 05/07/2015   Bilateral leg edema 05/07/2015   Anemia 10/12/2013   FH: CAD (coronary artery disease) 04/27/2012   OSTEOARTHRITIS 03/30/2008   Hyperlipidemia 07/22/2007   OBESITY NOS 02/22/2007   Allergic rhinitis 02/22/2007   Essential hypertension 02/18/2007    Current Outpatient Medications on File Prior to Visit  Medication Sig Dispense Refill   amLODipine (NORVASC) 10 MG tablet Take 1 tablet by mouth once daily 90 tablet 0   aspirin 81 MG chewable tablet Chew 81 mg by mouth daily.     atenolol (TENORMIN) 25 MG tablet Take 1 tablet by mouth once daily 90 tablet 0   atorvastatin (LIPITOR) 20 MG tablet Take 1 tablet by mouth once daily 90 tablet 0   eszopiclone (LUNESTA) 2 MG TABS tablet Take 1 tablet (2 mg total) by mouth at bedtime as needed for sleep. Take immediately before bedtime 30 tablet 0    potassium chloride (KLOR-CON) 10 MEQ tablet TAKE 1 TABLET BY MOUTH THREE TIMES DAILY 270 tablet 0   sertraline (ZOLOFT) 50 MG tablet Take 1 tablet (50 mg total) by mouth daily. 30 tablet 3   triamterene-hydrochlorothiazide (MAXZIDE-25) 37.5-25 MG tablet Take 1 tablet by mouth once daily 90 tablet 0   [DISCONTINUED] potassium chloride (KLOR-CON M10) 10 MEQ tablet Take 1 tablet (10 mEq total) by mouth three times daily. 90 tablet 0   No current facility-administered medications on file prior to visit.    Past Medical History:  Diagnosis Date   FH: CAD (coronary artery disease)    High cholesterol    Hypertension    Memory loss    Obesity     Past Surgical History:  Procedure Laterality Date   CARPAL TUNNEL RELEASE     bilateral   HIP SURGERY      Social History   Socioeconomic History   Marital status: Married    Spouse name: Not on file   Number of children: 2   Years of education: 12th    Highest education level: Not on file  Occupational History   Occupation: Psychologist, sport and exercise for [employer]    Comment: Laid Off  Tobacco Use   Smoking status: Never   Smokeless tobacco: Never  Substance and Sexual Activity   Alcohol use: No   Drug use: No   Sexual activity: Not on file  Other Topics Concern  Not on file  Social History Narrative   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        Social Determinants of Health   Financial Resource Strain: Not on file  Food Insecurity: Not on file  Transportation Needs: Not on file  Physical Activity: Not on file  Stress: Not on file  Social Connections: Not on file    Family History  Problem Relation Age of Onset   CAD Sister    CAD Father    Kidney disease Sister    Lung cancer Mother    Brain cancer Mother    Colon cancer Neg Hx    Stomach cancer Neg Hx    Pancreatic cancer Neg Hx    Esophageal cancer Neg Hx    Colon polyps Neg Hx    Rectal cancer Neg Hx     Review of  Systems     Objective:  There were no vitals filed for this visit. BP Readings from Last 3 Encounters:  03/25/21 126/80  10/05/20 112/66  09/20/20 130/68   Wt Readings from Last 3 Encounters:  03/25/21 156 lb (70.8 kg)  10/05/20 153 lb 9.6 oz (69.7 kg)  09/20/20 151 lb (68.5 kg)   There is no height or weight on file to calculate BMI.   Physical Exam    Constitutional: Appears well-developed and well-nourished. No distress.  HENT:  Head: Normocephalic and atraumatic.  Neck: Neck supple. No tracheal deviation present. No thyromegaly present.  No cervical lymphadenopathy Cardiovascular: Normal rate, regular rhythm and normal heart sounds.   No murmur heard. No carotid bruit .  No edema Pulmonary/Chest: Effort normal and breath sounds normal. No respiratory distress. No has no wheezes. No rales.  Skin: Skin is warm and dry. Not diaphoretic.  Psychiatric: Normal mood and affect. Behavior is normal.      Assessment & Plan:    See Problem List for Assessment and Plan of chronic medical problems.

## 2021-04-26 ENCOUNTER — Ambulatory Visit: Payer: Medicare Other | Admitting: Internal Medicine

## 2021-04-29 NOTE — Progress Notes (Signed)
Subjective:    Patient ID: Brooke [name], female    DOB: [birth year], 70 y.o.   MRN: [id number]  This visit occurred during the SARS-CoV-2 public health emergency.  Safety protocols were in place, including screening questions prior to the visit, additional usage of staff PPE, and extensive cleaning of exam room while observing appropriate contact time as indicated for disinfecting solutions.     HPI The patient is here for follow up of their chronic medical problems, including insomnia, depression/grieving.  6 weeks ago we stopped the remeron and started sertraline 50 mg daily and lunesta 2 mg at night.   The lunesta helps her sleep 4-5 hours then she is awake.  She can not fall back asleep.  She does not feel tired throughout the day.    [facility]    She denies depression.  She feels anxious at times.    She would like to get more sleep.    She is not speaking to a therapist.  She has more good days than bad days.   Medications and allergies reviewed with patient and updated if appropriate.  Patient Active Problem List   Diagnosis Date Noted   Eczema 03/25/2021   Palpitations 10/05/2020   DOE (dyspnea on exertion) 09/20/2020   Grieving 09/04/2020   Rash and nonspecific skin eruption 12/05/2019   GERD (gastroesophageal reflux disease) 06/01/2019   Hot flashes 10/08/2018   Chest tightness 08/24/2018   Pain of left heel 01/11/2018   Sleep difficulties 04/15/2016   Prediabetes 05/07/2015   Memory difficulties 05/07/2015   Bilateral leg edema 05/07/2015   Anemia 10/12/2013   FH: CAD (coronary artery disease) 04/27/2012   OSTEOARTHRITIS 03/30/2008   Hyperlipidemia 07/22/2007   OBESITY NOS 02/22/2007   Allergic rhinitis 02/22/2007   Essential hypertension 02/18/2007    Current Outpatient Medications on File Prior to Visit  Medication Sig Dispense Refill   amLODipine (NORVASC) 10 MG tablet Take 1 tablet by mouth once daily 90 tablet 0   aspirin  81 MG chewable tablet Chew 81 mg by mouth daily.     atenolol (TENORMIN) 25 MG tablet Take 1 tablet by mouth once daily 90 tablet 0   atorvastatin (LIPITOR) 20 MG tablet Take 1 tablet by mouth once daily 90 tablet 0   eszopiclone (LUNESTA) 2 MG TABS tablet Take 1 tablet (2 mg total) by mouth at bedtime as needed for sleep. Take immediately before bedtime 30 tablet 0   potassium chloride (KLOR-CON) 10 MEQ tablet TAKE 1 TABLET BY MOUTH THREE TIMES DAILY 270 tablet 0   sertraline (ZOLOFT) 50 MG tablet Take 1 tablet (50 mg total) by mouth daily. 30 tablet 3   triamterene-hydrochlorothiazide (MAXZIDE-25) 37.5-25 MG tablet Take 1 tablet by mouth once daily 90 tablet 0   [DISCONTINUED] potassium chloride (KLOR-CON M10) 10 MEQ tablet Take 1 tablet (10 mEq total) by mouth three times daily. 90 tablet 0   No current facility-administered medications on file prior to visit.    Past Medical History:  Diagnosis Date   FH: CAD (coronary artery disease)    High cholesterol    Hypertension    Memory loss    Obesity     Past Surgical History:  Procedure Laterality Date   CARPAL TUNNEL RELEASE     bilateral   HIP SURGERY      Social History   Socioeconomic History   Marital status: Married    Spouse name: Not  on file   Number of children: 2   Years of education: 12th    Highest education level: Not on file  Occupational History   Occupation: Psychologist, sport and exercise for [employer]    Comment: Laid Off  Tobacco Use   Smoking status: Never   Smokeless tobacco: Never  Substance and Sexual Activity   Alcohol use: No   Drug use: No   Sexual activity: Not on file  Other Topics Concern   Not on file  Social History Narrative   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        Social Determinants of Health   Financial Resource Strain: Not on file  Food Insecurity: Not on file  Transportation Needs: Not on file  Physical Activity: Not on file  Stress: Not on  file  Social Connections: Not on file    Family History  Problem Relation Age of Onset   CAD Sister    CAD Father    Kidney disease Sister    Lung cancer Mother    Brain cancer Mother    Colon cancer Neg Hx    Stomach cancer Neg Hx    Pancreatic cancer Neg Hx    Esophageal cancer Neg Hx    Colon polyps Neg Hx    Rectal cancer Neg Hx     Review of Systems  Constitutional:  Positive for appetite change (grabbing food - not fixing meals).  Respiratory:  Negative for shortness of breath.   Cardiovascular:  Negative for chest pain and palpitations.       Heart pounding at times - when she is active  Gastrointestinal:  Negative for abdominal pain and nausea.  Neurological:  Negative for light-headedness and headaches.  Psychiatric/Behavioral:  Positive for sleep disturbance. The patient is nervous/anxious (at times).       Objective:   Vitals:   04/30/21 0834  BP: 118/78  Pulse: 82  Temp: 98 F (36.7 C)  SpO2: 98%   BP Readings from Last 3 Encounters:  04/30/21 118/78  03/25/21 126/80  10/05/20 112/66   Wt Readings from Last 3 Encounters:  04/30/21 157 lb (71.2 kg)  03/25/21 156 lb (70.8 kg)  10/05/20 153 lb 9.6 oz (69.7 kg)   Body mass index is 30.06 kg/m.   Physical Exam    Constitutional: Appears well-developed and well-nourished. No distress.  Skin: Skin is warm and dry. Not diaphoretic.  Psychiatric: Slightly depressed mood and affect. Behavior is normal.      Assessment & Plan:    See Problem List for Assessment and Plan of chronic medical problems.

## 2021-04-29 NOTE — Patient Instructions (Addendum)
      Medications changes include :   increase sertraline to 100 mg daily, increase lunesta to 3 mg nightly   Your prescription(s) have been submitted to your pharmacy. Please take as directed and contact our office if you believe you are having problem(s) with the medication(s).   Please followup in 6 months

## 2021-04-30 ENCOUNTER — Encounter: Payer: Self-pay | Admitting: Internal Medicine

## 2021-04-30 ENCOUNTER — Other Ambulatory Visit: Payer: Self-pay

## 2021-04-30 ENCOUNTER — Ambulatory Visit (INDEPENDENT_AMBULATORY_CARE_PROVIDER_SITE_OTHER): Payer: Medicare Other | Admitting: Internal Medicine

## 2021-04-30 VITALS — BP 118/78 | HR 82 | Temp 98.0°F | Ht 60.6 in | Wt 157.0 lb

## 2021-04-30 DIAGNOSIS — G479 Sleep disorder, unspecified: Secondary | ICD-10-CM | POA: Diagnosis not present

## 2021-04-30 DIAGNOSIS — F4321 Adjustment disorder with depressed mood: Secondary | ICD-10-CM

## 2021-04-30 MED ORDER — SERTRALINE HCL 100 MG PO TABS: 100.0000 mg | ORAL_TABLET | Freq: Every day | ORAL | 1 refills | Status: DC

## 2021-04-30 MED ORDER — ESZOPICLONE 3 MG PO TABS: 3.0000 mg | ORAL_TABLET | Freq: Every day | ORAL | 5 refills | Status: DC

## 2021-04-30 NOTE — Assessment & Plan Note (Signed)
She is still in the grieving process-her husband died earlier this year She does have some associated depression, anxiety and insomnia Do not think her anxiety or depression is ideally controlled Increase sertraline to 100 mg daily

## 2021-04-30 NOTE — Addendum Note (Signed)
Addended by: Marcina Millard on: 04/30/2021 09:13 AM   Modules accepted: Orders

## 2021-04-30 NOTE — Assessment & Plan Note (Signed)
Chronic Started after the death of her husband-never had sleep issues prior Discussed possibly seeing a therapist Will increase her sertraline to help some with her underlying depression and anxiety associated with her grieving Will increase Lunesta to 3 mg nightly to see if this helps improve the length of her sleep at all Continue regular exercise and social activities

## 2021-05-01 ENCOUNTER — Telehealth: Payer: Self-pay | Admitting: Internal Medicine

## 2021-05-01 ENCOUNTER — Emergency Department (HOSPITAL_COMMUNITY)
Admission: EM | Admit: 2021-05-01 | Discharge: 2021-05-01 | Disposition: A | Discharge status: Home / Self Care | Payer: Medicare Other | Attending: Emergency Medicine | Admitting: Emergency Medicine

## 2021-05-01 ENCOUNTER — Encounter (HOSPITAL_COMMUNITY): Payer: Self-pay

## 2021-05-01 ENCOUNTER — Emergency Department (HOSPITAL_COMMUNITY): Payer: Medicare Other

## 2021-05-01 ENCOUNTER — Ambulatory Visit: Payer: Medicare Other | Admitting: Internal Medicine

## 2021-05-01 DIAGNOSIS — M19012 Primary osteoarthritis, left shoulder: Secondary | ICD-10-CM | POA: Diagnosis not present

## 2021-05-01 DIAGNOSIS — R002 Palpitations: Secondary | ICD-10-CM | POA: Diagnosis not present

## 2021-05-01 DIAGNOSIS — R630 Anorexia: Secondary | ICD-10-CM | POA: Diagnosis present

## 2021-05-01 DIAGNOSIS — Z20822 Contact with and (suspected) exposure to covid-19: Secondary | ICD-10-CM | POA: Diagnosis not present

## 2021-05-01 DIAGNOSIS — I2510 Atherosclerotic heart disease of native coronary artery without angina pectoris: Secondary | ICD-10-CM | POA: Diagnosis not present

## 2021-05-01 DIAGNOSIS — R000 Tachycardia, unspecified: Secondary | ICD-10-CM | POA: Diagnosis not present

## 2021-05-01 DIAGNOSIS — Z7982 Long term (current) use of aspirin: Secondary | ICD-10-CM | POA: Diagnosis not present

## 2021-05-01 DIAGNOSIS — F321 Major depressive disorder, single episode, moderate: Secondary | ICD-10-CM | POA: Diagnosis not present

## 2021-05-01 DIAGNOSIS — E860 Dehydration: Secondary | ICD-10-CM | POA: Insufficient documentation

## 2021-05-01 DIAGNOSIS — R0602 Shortness of breath: Secondary | ICD-10-CM | POA: Diagnosis not present

## 2021-05-01 DIAGNOSIS — Z79899 Other long term (current) drug therapy: Secondary | ICD-10-CM | POA: Diagnosis not present

## 2021-05-01 DIAGNOSIS — I10 Essential (primary) hypertension: Secondary | ICD-10-CM | POA: Diagnosis not present

## 2021-05-01 DIAGNOSIS — M19011 Primary osteoarthritis, right shoulder: Secondary | ICD-10-CM | POA: Diagnosis not present

## 2021-05-01 LAB — CBC WITH DIFFERENTIAL/PLATELET
Abs Immature Granulocytes: 0.04 10*3/uL (ref 0.00–0.07)
Basophils Absolute: 0.1 10*3/uL (ref 0.0–0.1)
Basophils Relative: 1 %
Eosinophils Absolute: 0 10*3/uL (ref 0.0–0.5)
Eosinophils Relative: 0 %
HCT: 40.3 % (ref 36.0–46.0)
Hemoglobin: 13 g/dL (ref 12.0–15.0)
Immature Granulocytes: 0 %
Lymphocytes Relative: 8 %
Lymphs Abs: 1 10*3/uL (ref 0.7–4.0)
MCH: 23.7 pg — ABNORMAL LOW (ref 26.0–34.0)
MCHC: 32.3 g/dL (ref 30.0–36.0)
MCV: 73.4 fL — ABNORMAL LOW (ref 80.0–100.0)
Monocytes Absolute: 0.5 10*3/uL (ref 0.1–1.0)
Monocytes Relative: 4 %
Neutro Abs: 11.3 10*3/uL — ABNORMAL HIGH (ref 1.7–7.7)
Neutrophils Relative %: 87 %
Platelets: 240 10*3/uL (ref 150–400)
RBC: 5.49 MIL/uL — ABNORMAL HIGH (ref 3.87–5.11)
RDW: 16.1 % — ABNORMAL HIGH (ref 11.5–15.5)
WBC: 13 10*3/uL — ABNORMAL HIGH (ref 4.0–10.5)
nRBC: 0 % (ref 0.0–0.2)

## 2021-05-01 LAB — I-STAT CHEM 8, ED
BUN: 20 mg/dL (ref 8–23)
Calcium, Ion: 1.22 mmol/L (ref 1.15–1.40)
Chloride: 104 mmol/L (ref 98–111)
Creatinine, Ser: 1.3 mg/dL — ABNORMAL HIGH (ref 0.44–1.00)
Glucose, Bld: 155 mg/dL — ABNORMAL HIGH (ref 70–99)
HCT: 43 % (ref 36.0–46.0)
Hemoglobin: 14.6 g/dL (ref 12.0–15.0)
Potassium: 3.7 mmol/L (ref 3.5–5.1)
Sodium: 138 mmol/L (ref 135–145)
TCO2: 22 mmol/L (ref 22–32)

## 2021-05-01 LAB — BASIC METABOLIC PANEL
Anion gap: 13 (ref 5–15)
BUN: 19 mg/dL (ref 8–23)
CO2: 20 mmol/L — ABNORMAL LOW (ref 22–32)
Calcium: 10.3 mg/dL (ref 8.9–10.3)
Chloride: 104 mmol/L (ref 98–111)
Creatinine, Ser: 1.25 mg/dL — ABNORMAL HIGH (ref 0.44–1.00)
GFR, Estimated: 46 mL/min — ABNORMAL LOW (ref >60)
Glucose, Bld: 153 mg/dL — ABNORMAL HIGH (ref 70–99)
Potassium: 3.6 mmol/L (ref 3.5–5.1)
Sodium: 137 mmol/L (ref 135–145)

## 2021-05-01 LAB — RESP PANEL BY RT-PCR (FLU A&B, COVID) ARPGX2
Influenza A by PCR: NEGATIVE
Influenza B by PCR: NEGATIVE
SARS Coronavirus 2 by RT PCR: NEGATIVE

## 2021-05-01 LAB — TROPONIN I (HIGH SENSITIVITY)
Troponin I (High Sensitivity): 4 ng/L (ref <18)
Troponin I (High Sensitivity): 5 ng/L (ref <18)

## 2021-05-01 MED ORDER — IOHEXOL 350 MG/ML SOLN
80.0000 mL | Freq: Once | INTRAVENOUS | Status: AC | PRN
  Administered 2021-05-01: 80 mL via INTRAVENOUS

## 2021-05-01 NOTE — ED Provider Notes (Signed)
I provided a substantive portion of the care of this patient.  I personally performed the entirety of the medical decision making for this encounter.  EKG Interpretation  Date/Time:  Wednesday May 01 2021 14:26:53 EST Ventricular Rate:  112 PR Interval:  143 QRS Duration: 53 QT Interval:  372 QTC Calculation: 508 R Axis:   50 Text Interpretation: Sinus tachycardia Low voltage, precordial leads Probable anteroseptal infarct, old Nonspecific T abnormalities, lateral leads Prolonged QT interval Baseline wander in lead(s) III Confirmed by Lacretia Leigh ([id number]) on 05/01/2021 4:59:57 PM   70 year old female presents with palpitations associate with anxiety.  Patient also notes decreased oral intake due to worsening insomnia due to likely depression.  Work-up here is reassuring including CT of chest.  Renal function noted as well 2.  Patient does not have emesis.  She was encouraged to drink liquids and follow-up with her doctor.   Lacretia Leigh, MD 05/01/21 [phone]

## 2021-05-01 NOTE — Discharge Instructions (Signed)
You were seen today for evaluation of your fast heart rate and your shortness of breath.  We did quite extensive work-up, including labs and a CT of your chest.  CT was negative for blood clots and other acute problems.  Labs are relatively unremarkable, however your creatinine was slightly high at 1.30.  This is most likely due to to dehydration, and will resolve once you began to drink enough fluids.  Please follow-up with your PCP in order to set up care with a therapist.  Remember to drink lots of fluids throughout the day.  You also mentioned not sleeping at night and turning your TV when you wake up, I recommend avoiding this in the future as it can make insomnia worse.  If you wake up in the night, you can try reading a book in order to fall back asleep.

## 2021-05-01 NOTE — ED Triage Notes (Signed)
Pt arrived via POV, c/o sudden onset SOB, worsening with exertion that started this morning shortly after waking. Denies any chest pain

## 2021-05-01 NOTE — Telephone Encounter (Signed)
Pt. Connected to [facility] 11.23.2022.   Caller states she has heart rate is faster than normal and fast and heavy breathing. BP is 150/60 Resp=26, Pulse=112   Advised to go to ED. Pt understands and complied.

## 2021-05-01 NOTE — ED Provider Notes (Signed)
[facility] DEPT Provider Note   CSN: [id number] Arrival date & time: 05/01/21  1422     History Chief Complaint  Patient presents with   Shortness of Breath    Brooke [name] is a 70 y.o. female brought to the ED by her son today for evaluation of palpitations and shortness of breath.  Patient woke up this morning feeling like her heart was pounding out of her chest and reports symptoms lasting 4 to 5 hours.  Symptoms resolved spontaneously just prior to arrival.  No alleviating or aggravating factors.  She does report similar episode yesterday for which she saw her PCP.  No work-up or treatment at that time.  Patient states she recently lost her husband in March 2022, and she and her son report she has been depressed with decreased appetite, sleep and fluid intake.  Patient is compliant on her medications.  She denies chest pain, abdominal pain, nausea vomiting diarrhea headache, vision changes, dizziness and syncope.  She also denies recent illness, including fever, chills, congestion and cough.   Shortness of Breath Associated symptoms: no abdominal pain, no cough, no fever, no headaches, no rash and no vomiting       Past Medical History:  Diagnosis Date   FH: CAD (coronary artery disease)    High cholesterol    Hypertension    Memory loss    Obesity     Patient Active Problem List   Diagnosis Date Noted   Eczema 03/25/2021   Palpitations 10/05/2020   DOE (dyspnea on exertion) 09/20/2020   Grieving 09/04/2020   Rash and nonspecific skin eruption 12/05/2019   GERD (gastroesophageal reflux disease) 06/01/2019   Hot flashes 10/08/2018   Chest tightness 08/24/2018   Pain of left heel 01/11/2018   Sleep difficulties 04/15/2016   Prediabetes 05/07/2015   Memory difficulties 05/07/2015   Bilateral leg edema 05/07/2015   Anemia 10/12/2013   FH: CAD (coronary artery disease) 04/27/2012   OSTEOARTHRITIS 03/30/2008   Hyperlipidemia  07/22/2007   OBESITY NOS 02/22/2007   Allergic rhinitis 02/22/2007   Essential hypertension 02/18/2007    Past Surgical History:  Procedure Laterality Date   CARPAL TUNNEL RELEASE     bilateral   HIP SURGERY       OB History   No obstetric history on file.     Family History  Problem Relation Age of Onset   CAD Sister    CAD Father    Kidney disease Sister    Lung cancer Mother    Brain cancer Mother    Colon cancer Neg Hx    Stomach cancer Neg Hx    Pancreatic cancer Neg Hx    Esophageal cancer Neg Hx    Colon polyps Neg Hx    Rectal cancer Neg Hx     Social History   Tobacco Use   Smoking status: Never   Smokeless tobacco: Never  Substance Use Topics   Alcohol use: No   Drug use: No    Home Medications Prior to Admission medications   Medication Sig Start Date End Date Taking? Authorizing Provider  amLODipine (NORVASC) 10 MG tablet Take 1 tablet by mouth once daily 02/20/21   Binnie Rail, MD  aspirin 81 MG chewable tablet Chew 81 mg by mouth daily.    [provider]  atenolol (TENORMIN) 25 MG tablet Take 1 tablet by mouth once daily 08/16/20   Binnie Rail, MD  atorvastatin (LIPITOR) 20 MG tablet Take  1 tablet by mouth once daily 02/20/21   Binnie Rail, MD  Eszopiclone 3 MG TABS Take 1 tablet (3 mg total) by mouth at bedtime. Take immediately before bedtime 04/30/21   Binnie Rail, MD  potassium chloride (KLOR-CON) 10 MEQ tablet TAKE 1 TABLET BY MOUTH THREE TIMES DAILY 03/14/21   Binnie Rail, MD  sertraline (ZOLOFT) 100 MG tablet Take 1 tablet (100 mg total) by mouth daily. 04/30/21   Binnie Rail, MD  triamterene-hydrochlorothiazide (MAXZIDE-25) 37.5-25 MG tablet Take 1 tablet by mouth once daily 03/14/21   Binnie Rail, MD  potassium chloride (KLOR-CON M10) 10 MEQ tablet Take 1 tablet (10 mEq total) by mouth three times daily. 07/01/19 08/01/19  Binnie Rail, MD    Allergies    Penicillins  Review of Systems   Review of Systems   Constitutional:  Negative for fever.  HENT: Negative.    Eyes: Negative.   Respiratory:  Positive for shortness of breath. Negative for cough.   Cardiovascular:  Positive for palpitations.  Gastrointestinal:  Negative for abdominal pain and vomiting.  Endocrine: Negative.   Genitourinary: Negative.   Musculoskeletal: Negative.   Skin:  Negative for rash.  Neurological:  Negative for headaches.  All other systems reviewed and are negative.  Physical Exam Updated Vital Signs BP (!) 152/65   Pulse (!) 102   Temp 98.2 F (36.8 C) (Oral)   Resp (!) 21   SpO2 99%   Physical Exam Vitals and nursing note reviewed.  Constitutional:      General: She is not in acute distress.    Appearance: She is not ill-appearing.  HENT:     Head: Atraumatic.  Eyes:     Conjunctiva/sclera: Conjunctivae normal.  Cardiovascular:     Rate and Rhythm: Regular rhythm. Tachycardia present.     Pulses: Normal pulses.          Radial pulses are 2+ on the right side and 2+ on the left side.       Dorsalis pedis pulses are 2+ on the right side and 2+ on the left side.     Heart sounds: Normal heart sounds. No murmur heard. Pulmonary:     Effort: Pulmonary effort is normal. No respiratory distress.     Breath sounds: Normal breath sounds.  Abdominal:     General: Abdomen is flat. There is no distension.     Palpations: Abdomen is soft.     Tenderness: There is no abdominal tenderness.  Musculoskeletal:        General: Normal range of motion.     Cervical back: Normal range of motion.     Right lower leg: No edema.     Left lower leg: No edema.  Skin:    General: Skin is warm and dry.     Capillary Refill: Capillary refill takes less than 2 seconds.  Neurological:     General: No focal deficit present.     Mental Status: She is alert.  Psychiatric:        Mood and Affect: Mood normal.    ED Results / Procedures / Treatments   Labs (all labs ordered are listed, but only abnormal results are  displayed) Labs Reviewed  BASIC METABOLIC PANEL - Abnormal; Notable for the following components:      Result Value   CO2 20 (*)    Glucose, Bld 153 (*)    Creatinine, Ser 1.25 (*)    GFR, Estimated 46 (*)  All other components within normal limits  CBC WITH DIFFERENTIAL/PLATELET - Abnormal; Notable for the following components:   WBC 13.0 (*)    RBC 5.49 (*)    MCV 73.4 (*)    MCH 23.7 (*)    RDW 16.1 (*)    All other components within normal limits  I-STAT CHEM 8, ED - Abnormal; Notable for the following components:   Creatinine, Ser 1.30 (*)    Glucose, Bld 155 (*)    All other components within normal limits  RESP PANEL BY RT-PCR (FLU A&B, COVID) ARPGX2  TROPONIN I (HIGH SENSITIVITY)  TROPONIN I (HIGH SENSITIVITY)    EKG EKG Interpretation  Date/Time:  Wednesday May 01 2021 14:26:53 EST Ventricular Rate:  112 PR Interval:  143 QRS Duration: 53 QT Interval:  372 QTC Calculation: 508 R Axis:   50 Text Interpretation: Sinus tachycardia Low voltage, precordial leads Probable anteroseptal infarct, old Nonspecific T abnormalities, lateral leads Prolonged QT interval Baseline wander in lead(s) III Confirmed by Lacretia Leigh ([id number]) on 05/01/2021 4:33:56 PM  Radiology CT Angio Chest PE W and/or Wo Contrast  Result Date: 05/01/2021 CLINICAL DATA:  Shortness of breath, high prob PE suspected EXAM: CT ANGIOGRAPHY CHEST WITH CONTRAST TECHNIQUE: Multidetector CT imaging of the chest was performed using the standard protocol during bolus administration of intravenous contrast. Multiplanar CT image reconstructions and MIPs were obtained to evaluate the vascular anatomy. CONTRAST:  32mL OMNIPAQUE IOHEXOL 350 MG/ML SOLN COMPARISON:  None. FINDINGS: Cardiovascular: Heart size normal. Trace pericardial fluid. The RV is nondilated. Satisfactory opacification of pulmonary arteries noted, and there is no evidence of pulmonary emboli. Scattered coronary calcifications. Adequate contrast  opacification of the thoracic aorta with no evidence of dissection, aneurysm, or stenosis. There is bovine variant brachiocephalic arch anatomy without proximal stenosis. Minimal aortic plaque. Mediastinum/Nodes: No mediastinal hematoma, mass or adenopathy. Lungs/Pleura: No pleural effusion. No pneumothorax. Lungs are clear. Upper Abdomen: Adrenal glands normal. 1.4 cm left renal lesion possibly cyst but incompletely characterized. Musculoskeletal: Anterior vertebral endplate spurring at multiple levels in the lower thoracic spine. Mild bilateral shoulder DJD. Negative for fracture or worrisome bone lesion. Review of the MIP images confirms the above findings. IMPRESSION: 1. Negative for acute PE or thoracic aortic dissection. 2. Coronary and Aortic Atherosclerosis (ICD10-170.0). Electronically Signed   By: Lucrezia Europe M.D.   On: 05/01/2021 16:05    Procedures Procedures   Medications Ordered in ED Medications  iohexol (OMNIPAQUE) 350 MG/ML injection 80 mL (80 mLs Intravenous Contrast Given 05/01/21 1533)    ED Course  I have reviewed the triage vital signs and the nursing notes.  Pertinent labs & imaging results that were available during my care of the patient were reviewed by me and considered in my medical decision making (see chart for details).    MDM Rules/Calculators/A&P                         Is a 70 year old female with history of hypertension presents for evaluation of palpitations and shortness of breath that occurred for about 4 to 5 hours this morning before spontaneous resolution.  On exam, there is initial concern for PE given her tachycardia without chest pain, and normal lung exam.  CTA was negative for PE and aortic dissection.  EKG and troponin reassuring that patient is not having acute MI.  Patient has no abdominal symptoms with a normal abdominal exam.  On labs, patient was found to have an elevated WBC  at 13 and elevated creatinine at 1.3 with a normal BUN.  Given her  otherwise overall reassuring work-up and exam, patient's symptoms and results are consistent with dehydration.  She has admitted to being depressed, not eating, drinking or sleeping very much lately.  She has no prior history of kidney dysfunction.  Counseled patient to discuss with PCP about obtaining a therapy referral as well as monitoring her lab results.  However, educated patient on need to hydrate adequately and take care of herself to help prevent symptoms worse.  Patient agrees, and is amenable to plan. Final Clinical Impression(s) / ED Diagnoses Final diagnoses:  Dehydration  Current moderate episode of major depressive disorder, unspecified whether recurrent [facility])    Rx / DC Orders ED Discharge Orders     None        Rodena Piety 05/01/21 1708    Lacretia Leigh, MD 05/01/21 1721

## 2021-05-01 NOTE — ED Provider Notes (Signed)
Emergency Medicine Provider Triage Evaluation Note  Brooke [name] , a 70 y.o. female  was evaluated in triage.  Pt complains of shortness of breath.  Symptoms started suddenly shortly after she got up today.  Initially she just tried to lay in bed to see if it would get better but shortness of breath continued to worsen.  She denies associated chest pain.  Worse with exertion.  No congestion, cough or fever.  No abdominal pain.  Denies any leg pain or swelling.  No history of similar symptoms or history of lung or breathing problems.  Review of Systems  Positive: Shortness of breath Negative: Chest pain, fever, cough, leg swelling  Physical Exam  BP (!) 146/71 (BP Location: Right Arm)   Pulse (!) 110   Temp 98.2 F (36.8 C) (Oral)   Resp (!) 31   SpO2 100%  Gen:   Awake, no distress   Resp:  Tachypneic with slightly increased work of breathing, lungs clear to auscultation on exam MSK:   Moves extremities without difficulty  Other:    Medical Decision Making  Medically screening exam initiated at 2:36 PM.  Appropriate orders placed.  Brooke [name] was informed that the remainder of the evaluation will be completed by another provider, this initial triage assessment does not replace that evaluation, and the importance of remaining in the ED until their evaluation is complete.  Concern for potential PE given tachypnea with clear lungs and no other associated symptoms.   Jacqlyn Larsen, PA-C 05/01/21 [place], Bra[place], MD 05/02/21 [phone]

## 2021-05-09 ENCOUNTER — Telehealth: Payer: Medicare Other

## 2021-05-15 ENCOUNTER — Encounter: Payer: Self-pay | Admitting: Internal Medicine

## 2021-05-15 ENCOUNTER — Ambulatory Visit (INDEPENDENT_AMBULATORY_CARE_PROVIDER_SITE_OTHER): Payer: Medicare Other | Admitting: Internal Medicine

## 2021-05-15 ENCOUNTER — Other Ambulatory Visit: Payer: Self-pay

## 2021-05-15 VITALS — BP 130/80 | HR 102 | Resp 18 | Ht 60.6 in | Wt 154.2 lb

## 2021-05-15 DIAGNOSIS — R0609 Other forms of dyspnea: Secondary | ICD-10-CM

## 2021-05-15 DIAGNOSIS — R002 Palpitations: Secondary | ICD-10-CM | POA: Diagnosis not present

## 2021-05-15 DIAGNOSIS — I10 Essential (primary) hypertension: Secondary | ICD-10-CM

## 2021-05-15 DIAGNOSIS — R7303 Prediabetes: Secondary | ICD-10-CM

## 2021-05-15 DIAGNOSIS — D649 Anemia, unspecified: Secondary | ICD-10-CM

## 2021-05-15 DIAGNOSIS — F4321 Adjustment disorder with depressed mood: Secondary | ICD-10-CM

## 2021-05-15 NOTE — Progress Notes (Signed)
Patient ID: Brooke [name], female   DOB: 1951-01-07, 70 y.o.   MRN: [id number]        Chief Complaint: follow up depression, palpitaitons, htn       HPI:  Brooke [name] is a 70 y.o. female here with c/o worsening mild depressive symptoms in the past 2 months with low mood, but no SI or HI, and no significant worsening anxiety.  Does have reduce po intake in the setting of maxide daily use, and recent increased generalized weakness, dizzy, palpitations, and mild doe.  Seen in ED, felt better for about 24 hrs after IVF but then worse again.  Still taking maxide.  Pt denies chest pain, wheezing, orthopnea, PND, increased LE swelling, or syncope.  Pt denies polydipsia, polyuria, or new focal neuro s/s.   Pt denies fever, wt loss, night sweats, loss of appetite, or other constitutional symptoms         Wt Readings from Last 3 Encounters:  05/15/21 154 lb 3.2 oz (69.9 kg)  04/30/21 157 lb (71.2 kg)  03/25/21 156 lb (70.8 kg)   BP Readings from Last 3 Encounters:  05/15/21 130/80  05/01/21 128/67  04/30/21 118/78         Past Medical History:  Diagnosis Date   FH: CAD (coronary artery disease)    High cholesterol    Hypertension    Memory loss    Obesity    Past Surgical History:  Procedure Laterality Date   CARPAL TUNNEL RELEASE     bilateral   HIP SURGERY      reports that she has never smoked. She has never used smokeless tobacco. She reports that she does not drink alcohol and does not use drugs. family history includes Brain cancer in her mother; CAD in her father and sister; Kidney disease in her sister; Lung cancer in her mother. Allergies  Allergen Reactions   Penicillins Hives   Current Outpatient Medications on File Prior to Visit  Medication Sig Dispense Refill   amLODipine (NORVASC) 10 MG tablet Take 1 tablet by mouth once daily 90 tablet 0   aspirin 81 MG chewable tablet Chew 81 mg by mouth daily.     atenolol (TENORMIN) 25 MG tablet Take 1 tablet by mouth once  daily 90 tablet 0   atorvastatin (LIPITOR) 20 MG tablet Take 1 tablet by mouth once daily 90 tablet 0   Eszopiclone 3 MG TABS Take 1 tablet (3 mg total) by mouth at bedtime. Take immediately before bedtime 30 tablet 5   potassium chloride (KLOR-CON) 10 MEQ tablet TAKE 1 TABLET BY MOUTH THREE TIMES DAILY 270 tablet 0   sertraline (ZOLOFT) 100 MG tablet Take 1 tablet (100 mg total) by mouth daily. 90 tablet 1   [DISCONTINUED] potassium chloride (KLOR-CON M10) 10 MEQ tablet Take 1 tablet (10 mEq total) by mouth three times daily. 90 tablet 0   No current facility-administered medications on file prior to visit.        ROS:  All others reviewed and negative.  Objective        PE:  BP 130/80   Pulse (!) 102   Resp 18   Ht 5' 0.6" (1.539 m)   Wt 154 lb 3.2 oz (69.9 kg)   SpO2 96%   BMI 29.52 kg/m                 Constitutional: Pt appears in NAD  HENT: Head: NCAT.                Right Ear: External ear normal.                 Left Ear: External ear normal.                Eyes: . Pupils are equal, round, and reactive to light. Conjunctivae and EOM are normal               Nose: without d/c or deformity               Neck: Neck supple. Gross normal ROM               Cardiovascular: Normal rate and regular rhythm.                 Pulmonary/Chest: Effort normal and breath sounds without rales or wheezing.                Abd:  Soft, NT, ND, + BS, no organomegaly               Neurological: Pt is alert. At baseline orientation, motor grossly intact               Skin: Skin is warm. No rashes, no other new lesions, LE edema - none               Psychiatric: Pt behavior is normal without agitation   Micro: none  Cardiac tracings I have personally interpreted today:  none  Pertinent Radiological findings (summarize): none   Lab Results  Component Value Date   WBC 13.0 (H) 05/01/2021   HGB 14.6 05/01/2021   HCT 43.0 05/01/2021   PLT 240 05/01/2021   GLUCOSE 155 (H)  05/01/2021   CHOL 145 07/12/2019   TRIG 100.0 07/12/2019   HDL 54.50 07/12/2019   LDLDIRECT 162.0 08/05/2012   LDLCALC 71 07/12/2019   ALT 18 09/20/2020   AST 22 09/20/2020   NA 138 05/01/2021   K 3.7 05/01/2021   CL 104 05/01/2021   CREATININE 1.30 (H) 05/01/2021   BUN 20 05/01/2021   CO2 20 (L) 05/01/2021   TSH 2.03 07/12/2019   INR 1.9 (H) 04/15/2008   HGBA1C 6.2 07/12/2019   MICROALBUR 24.2 (H) 03/19/2009   Assessment/Plan:  Brooke [name] is a 70 y.o. Black or African American [2] female with  has a past medical history of FH: CAD (coronary artery disease), High cholesterol, Hypertension, Memory loss, and Obesity.  Palpitations I suspect ectopy related to lower volume, declines cardiac monitor for now  DOE (dyspnea on exertion) I suspect related to mild low volume with maxide and lower po intake recent - exam o/w benign, to d/c maxide, ok to follow with expectant management, consider PFTs  Essential hypertension Will need f/u with PCP in 1 -2 wks to reasses improvement in symptoms overall and consider further eval and tx as needed  Prediabetes Lab Results  Component Value Date   HGBA1C 6.2 07/12/2019   Stable, pt to continue current medical treatment  - diet   Anemia Lab Results  Component Value Date   WBC 13.0 (H) 05/01/2021   HGB 14.6 05/01/2021   HCT 43.0 05/01/2021   MCV 73.4 (L) 05/01/2021   PLT 240 05/01/2021   Stable hgb, cont to follow  Grieving To continue zoloft for now, declines counseling referral  Followup: Return in about 2 weeks (around 05/29/2021) for  to Dr Quay Burow.  Cathlean Cower, MD 05/19/2021 5:52 PM [facility]

## 2021-05-15 NOTE — Patient Instructions (Signed)
Ok to stop the fluid pill (triamterene-hydrochlorothiazide, which is otherwise known as Maxide)  Please call if you change your mind about the referral for Counseling  Please continue all other medications as before, and refills have been done if requested.  Please have the pharmacy call with any other refills you may need.  Please continue your efforts at being more active, low cholesterol diet.  Please keep your appointments with your specialists as you may have planned  Please see Dr Quay Burow in 1-2 weeks to check the BP and your heart symptoms

## 2021-05-19 ENCOUNTER — Encounter: Payer: Self-pay | Admitting: Internal Medicine

## 2021-05-19 NOTE — Assessment & Plan Note (Signed)
Lab Results  Component Value Date   HGBA1C 6.2 07/12/2019   Stable, pt to continue current medical treatment  - diet

## 2021-05-19 NOTE — Assessment & Plan Note (Addendum)
I suspect related to mild low volume with maxide and lower po intake recent - exam o/w benign, to d/c maxide, ok to follow with expectant management, consider PFTs

## 2021-05-19 NOTE — Assessment & Plan Note (Signed)
To continue zoloft for now, declines counseling referral

## 2021-05-19 NOTE — Assessment & Plan Note (Signed)
I suspect ectopy related to lower volume, declines cardiac monitor for now

## 2021-05-19 NOTE — Assessment & Plan Note (Signed)
Will need f/u with PCP in 1 -2 wks to reasses improvement in symptoms overall and consider further eval and tx as needed

## 2021-05-19 NOTE — Assessment & Plan Note (Signed)
Lab Results  Component Value Date   WBC 13.0 (H) 05/01/2021   HGB 14.6 05/01/2021   HCT 43.0 05/01/2021   MCV 73.4 (L) 05/01/2021   PLT 240 05/01/2021   Stable hgb, cont to follow

## 2021-05-21 ENCOUNTER — Ambulatory Visit: Payer: Medicare Other

## 2021-05-21 ENCOUNTER — Other Ambulatory Visit: Payer: Self-pay

## 2021-05-21 ENCOUNTER — Other Ambulatory Visit: Payer: Self-pay | Admitting: Internal Medicine

## 2021-05-21 DIAGNOSIS — E7849 Other hyperlipidemia: Secondary | ICD-10-CM

## 2021-05-21 DIAGNOSIS — I10 Essential (primary) hypertension: Secondary | ICD-10-CM

## 2021-05-21 NOTE — Progress Notes (Signed)
Chronic Care Management Pharmacy Note  05/21/2021 Name:  Brooke [name] MRN:  [id number] DOB:  [birth year]  Summary: -Since last appointment where triamterene-hctz was discontinued  - notes that she has been starting to feel better - not feeling as SOB, has not completely resolved but has improved  -Continues to have issues with sleep, sleeping from about 10pm-2am - cannot fall back asleep after she has woken up -Since stopping maxzide has not checked BP as she does not have a blood pressure cuff at home   Recommendations/Changes made from today's visit: -Recommended for patient to have follow up with PCP as recommended by Dr. Jenny Reichmann with last appointment - patient has been scheduled  -Reviewed with patient proper sleep hygiene to help improve sleep, will continue current medications, for now  Subjective: Brooke [name] is an 70 y.o. year old female who is a primary patient of Burns, Claudina Lick, MD.  The CCM team was consulted for assistance with disease management and care coordination needs.    Engaged with patient by telephone for follow up visit in response to provider referral for pharmacy case management and/or care coordination services.   Consent to Services:  The patient was given information about Chronic Care Management services, agreed to services, and gave verbal consent prior to initiation of services.  Please see initial visit note for detailed documentation.   Patient Care Team: Binnie Rail, MD as PCP - General (Internal Medicine) Charlton Haws, [facility] as Pharmacist (Pharmacist)  Recent office visits: 05/15/2021 - Dr. Jenny Reichmann - evaluation of SOB - suspected to be due to low volume - maxide d/cd  04/30/2021 - Dr. Quay Burow - sleep difficulties- eszopiclone increased to $RemoveBefo'3mg'OPhLjDfrPua$  daily, sertraline increased to $RemoveBefo'100mg'ygFSRcPIFCj$  daily f/u in 6 months  03/25/2021 - Dr. Quay Burow - sleeping difficulties / depression - started on lunesta $RemoveBe'2mg'GRfMZwyCb$  daily and sertraline $RemoveBefore'50mg'SleYzJNtfeftn$  daily f/u in 1 month  Recent  consult visits: 10/05/20 Dr Acie Fredrickson (cardiology): eval SOB, palpitations. PO input has been irregular since husband's death. Advised to check BP, stay hydrated.  Hospital visits: 05/01/2021 - ED visit due to palpitations / SOB - symptoms improved prior to arrival - thought to be due to dehydration - counseled on proper hydration    Objective:  Lab Results  Component Value Date   CREATININE 1.30 (H) 05/01/2021   BUN 20 05/01/2021   GFR 53.49 (L) 09/20/2020   GFRNONAA 46 (L) 05/01/2021   GFRAA >90 04/27/2012   NA 138 05/01/2021   K 3.7 05/01/2021   CALCIUM 10.3 05/01/2021   CO2 20 (L) 05/01/2021   GLUCOSE 155 (H) 05/01/2021    Lab Results  Component Value Date/Time   HGBA1C 6.2 07/12/2019 02:29 PM   HGBA1C 6.1 10/08/2018 09:21 AM   GFR 53.49 (L) 09/20/2020 09:56 AM   GFR 62.65 07/12/2019 02:29 PM   MICROALBUR 24.2 (H) 03/19/2009 09:36 AM    Last diabetic Eye exam:  Lab Results  Component Value Date/Time   HMDIABEYEEXA No Retinopathy 01/15/2018 12:00 AM    Last diabetic Foot exam: No results found for: HMDIABFOOTEX   Lab Results  Component Value Date   CHOL 145 07/12/2019   HDL 54.50 07/12/2019   LDLCALC 71 07/12/2019   LDLDIRECT 162.0 08/05/2012   TRIG 100.0 07/12/2019   CHOLHDL 3 07/12/2019    Hepatic Function Latest Ref Rng & Units 09/20/2020 07/12/2019 10/08/2018  Total Protein 6.0 - 8.3 g/dL 7.2 7.2 7.6  Albumin 3.5 - 5.2 g/dL 3.7 3.8 4.1  AST  0 - 37 U/L $Remo'22 18 15  'IzZpv$ ALT 0 - 35 U/L $Remo'18 20 12  'xWhlQ$ Alk Phosphatase 39 - 117 U/L 84 81 93  Total Bilirubin 0.2 - 1.2 mg/dL 0.7 0.6 0.7  Bilirubin, Direct 0.0 - 0.3 mg/dL - - -    Lab Results  Component Value Date/Time   TSH 2.03 07/12/2019 02:29 PM   TSH 1.86 10/08/2018 09:21 AM    CBC Latest Ref Rng & Units 05/01/2021 05/01/2021 09/20/2020  WBC 4.0 - 10.5 K/uL - 13.0(H) 8.2  Hemoglobin 12.0 - 15.0 g/dL 14.6 13.0 11.6(L)  Hematocrit 36.0 - 46.0 % 43.0 40.3 36.6  Platelets 150 - 400 K/uL - 240 201.0    No results found  for: VD25OH  Clinical ASCVD: No  The 10-year ASCVD risk score (Arnett DK, et al., 2019) is: 10.2%   Values used to calculate the score:     Age: 43 years     Sex: Female     Is Non-Hispanic African American: Yes     Diabetic: No     Tobacco smoker: No     Systolic Blood Pressure: 950 mmHg     Is BP treated: Yes     HDL Cholesterol: 54.5 mg/dL     Total Cholesterol: 145 mg/dL    Depression screen [facility] 2/9 09/04/2020 07/12/2019 10/08/2018  Decreased Interest 3 0 0  Down, Depressed, Hopeless 3 0 0  PHQ - 2 Score 6 0 0  Altered sleeping 3 - -  Tired, decreased energy 3 - -  Change in appetite 3 - -  Feeling bad or failure about yourself  2 - -  Trouble concentrating 3 - -  Moving slowly or fidgety/restless 3 - -  Suicidal thoughts 0 - -  PHQ-9 Score 23 - -  Difficult doing work/chores Very difficult - -     Social History   Tobacco Use  Smoking Status Never  Smokeless Tobacco Never   BP Readings from Last 3 Encounters:  05/15/21 130/80  05/01/21 128/67  04/30/21 118/78   Pulse Readings from Last 3 Encounters:  05/15/21 (!) 102  05/01/21 89  04/30/21 82   Wt Readings from Last 3 Encounters:  05/15/21 154 lb 3.2 oz (69.9 kg)  04/30/21 157 lb (71.2 kg)  03/25/21 156 lb (70.8 kg)   BMI Readings from Last 3 Encounters:  05/15/21 29.52 kg/m  04/30/21 30.06 kg/m  03/25/21 29.87 kg/m    Assessment/Interventions: Review of patient past medical history, allergies, medications, health status, including review of consultants reports, laboratory and other test data, was performed as part of comprehensive evaluation and provision of chronic care management services.   SDOH:  (Social Determinants of Health) assessments and interventions performed: Yes  SDOH Screenings   Alcohol Screen: Not on file  Depression (PHQ2-9): Medium Risk   PHQ-2 Score: 23  Financial Resource Strain: Not on file  Food Insecurity: Not on file  Housing: Not on file  Physical Activity: Not on file   Social Connections: Not on file  Stress: Not on file  Tobacco Use: Low Risk    Smoking Tobacco Use: Never   Smokeless Tobacco Use: Never   Passive Exposure: Not on file  Transportation Needs: Not on file    [place]  Allergies  Allergen Reactions   Penicillins Hives    Medications Reviewed Today     Reviewed by Biagio Borg, MD (Physician) on 05/19/21 at [facility] List Status: <None>   Medication Order Taking?  Sig Documenting Provider Last Dose Status Informant  amLODipine (NORVASC) 10 MG tablet [id number] Yes Take 1 tablet by mouth once daily Binnie Rail, MD Taking Active   aspirin 81 MG chewable tablet [id number] Yes Chew 81 mg by mouth daily. [provider] Taking Active Self  atenolol (TENORMIN) 25 MG tablet [id number] Yes Take 1 tablet by mouth once daily Burns, Claudina Lick, MD Taking Active   atorvastatin (LIPITOR) 20 MG tablet [id number] Yes Take 1 tablet by mouth once daily Binnie Rail, MD Taking Active   Eszopiclone 3 MG TABS [id number] Yes Take 1 tablet (3 mg total) by mouth at bedtime. Take immediately before bedtime Binnie Rail, MD Taking Active     Discontinued 08/01/19 1327   potassium chloride (KLOR-CON) 10 MEQ tablet [id number] Yes TAKE 1 TABLET BY MOUTH THREE TIMES DAILY Burns, Claudina Lick, MD Taking Active   sertraline (ZOLOFT) 100 MG tablet [id number] Yes Take 1 tablet (100 mg total) by mouth daily. Binnie Rail, MD Taking Active             Patient Active Problem List   Diagnosis Date Noted   Eczema 03/25/2021   Palpitations 10/05/2020   DOE (dyspnea on exertion) 09/20/2020   Grieving 09/04/2020   Rash and nonspecific skin eruption 12/05/2019   GERD (gastroesophageal reflux disease) 06/01/2019   Hot flashes 10/08/2018   Chest tightness 08/24/2018   Pain of left heel 01/11/2018   Sleep difficulties 04/15/2016   Prediabetes 05/07/2015   Memory difficulties 05/07/2015   Bilateral leg edema 05/07/2015   Anemia 10/12/2013   FH: CAD  (coronary artery disease) 04/27/2012   OSTEOARTHRITIS 03/30/2008   Hyperlipidemia 07/22/2007   OBESITY NOS 02/22/2007   Allergic rhinitis 02/22/2007   Essential hypertension 02/18/2007    Immunization History  Administered Date(s) Administered   Fluad Quad(high Dose 65+) 03/10/2019, 03/25/2021   Influenza Whole 04/17/2009, 05/08/2010   Influenza, High Dose Seasonal PF 03/30/2015, 04/15/2016, 04/17/2017, 04/21/2018, 03/05/2019, 03/09/2020   Moderna Covid-19 Vaccine Bivalent Booster 46yrs & up 03/25/2021   Moderna Sars-Covid-2 Vaccination 07/02/2019, 08/06/2019   Pneumococcal Conjugate-13 04/21/2018   Pneumococcal Polysaccharide-23 08/05/2012, 06/01/2019   Td 06/09/1997, 03/30/2008   Zoster, Live 10/12/2013    Conditions to be addressed/monitored:  Hypertension, Hyperlipidemia, Chronic Kidney Disease, and Insomnia/depression  Care Plan : [place]  Updates made by Tomasa Blase, RPH since 05/21/2021 12:00 AM     Problem: Hypertension, Hyperlipidemia, Chronic Kidney Disease, and Insomnia/depression   Priority: High     Long-Range Goal: Disease management   Start Date: 11/15/2020  Expected End Date: 05/21/2022  This Visit's Progress: Not on track  Recent Progress: On track  Priority: High  Note:   Current Barriers:  Unable to independently monitor therapeutic efficacy  Does not request refills   Pharmacist Clinical Goal(s):  Patient will achieve adherence to monitoring guidelines and medication adherence to achieve therapeutic efficacy Refill medications through collaboration with PharmD and provider.   Interventions: 1:1 collaboration with Binnie Rail, MD regarding development and update of comprehensive plan of care as evidenced by provider attestation and co-signature Inter-disciplinary care team collaboration (see longitudinal plan of care) Comprehensive medication review performed; medication list updated in electronic medical record  Hypertension    Controlled - unknown BP control since last visit  BP goal is:  <130/80  Patient - not checking BP at home at this time  Patient home BP readings are ranging: n/a BP Readings from Last 3 Encounters:  05/15/21  130/80  05/01/21 128/67  04/30/21 118/78  Patient has failed these meds in the past: triamterene/hctz- stopped due to suspicion it was causing dehydration / SOB (05/15/2021)  Patient is currently controlled on the following medications: Amlodipine 10 mg daily AM Atenolol 25 mg daily   Discussed with patient BP monitoring as she has discontinued maxzide since last appointment - does not have a functioning BP cuff at this time - notes that SOB sx have improved since stopping maxzide    Plan: Continue current medications; maintain adequate hydration   Hyperlipidemia   Controlled LDL goal < 100 Lab Results  Component Value Date   LDLCALC 71 07/12/2019  Patient has failed these meds in past: n/a Patient is currently controlled on the following medications: Atorvastatin 20 mg daily Aspirin 81 mg daily   We discussed:  diet and exercise extensively; Cholesterol goals; benefits of statin for ASCVD risk reduction   Plan: Continue current medications and control with diet and exercise   Depression / Insomnia    Not ideally controlled  Patient has failed these meds in past: trazodone Patient is currently uncontrolled on the following medications: Lunesta 3mg  nightly  Sertraline 100mg  daily    Discussed with patient that she is still having issues with sleep - unable to stay asleep - typically goes to bed at 10pm - will wake up around 2-3am - unable to fall back asleep -Recommended for patient to improve sleep hygiene - recommended no screen time at least 1 hour prior to bedtime, no caffeinated beverages after lunch - patient agreeable to plan   Plan Continue current medications; refill sent to [facility]  Patient Goals/Self-Care Activities Patient will:  - take medications as  prescribed focus on medication adherence by pill box check blood pressure 2-3x weekly, document, and provide at future appointments engage in dietary modifications by increasing hydration       Medication Assistance: None required.  Patient affirms current coverage meets needs.  Compliance/Adherence/Medication fill history: Care Gaps: Shingrix TDAP (due 03/30/18) Covid booster (due 01/03/20)  Star-Rating Drugs: Atorvastatin - LF 07/31/20 x 90 ds   Patient's preferred pharmacy is:  [id number] [address] [place], [place] - [address] [place] [address] Phone: [phone] Fax: [phone]  Uses pill box? Yes Pt endorses 100% compliance  We discussed: Current pharmacy is preferred with insurance plan and patient is satisfied with pharmacy services Patient decided to: Continue current medication management strategy  Care Plan and Follow Up Patient Decision:  Patient agrees to Care Plan and Follow-up.  Plan: Telephone follow up appointment with care management team member scheduled for:  6 months  Tomasa Blase, PharmD Clinical Pharmacist, [place]

## 2021-05-21 NOTE — Patient Instructions (Signed)
Visit Information  Following are the goals we discussed today:   Manage My Medications   Timeframe:  Long-Range Goal Priority:  Medium Start Date:       11/15/20                      Expected End Date:   05/17/22                    Follow Up Date March 2023   - call for medicine refill 2 or 3 days before it runs out - call if I am sick and can't take my medicine - keep a list of all the medicines I take; vitamins and herbals too - use a pillbox to sort medicine  -check blood pressure 2-3x weekly, document, and provide at future appointments -engage in dietary modifications by increasing hydration   Why is this important?   These steps will help you keep on track with your medicines.  Plan: Telephone follow up appointment with care management team member scheduled for:  3 months The patient has been provided with contact information for the care management team and has been advised to call with any health related questions or concerns.   Tomasa Blase, PharmD Clinical Pharmacist, [facility]   Please call the care guide team at [phone] if you need to cancel or reschedule your appointment.   Patient verbalizes understanding of instructions provided today and agrees to view in [place].

## 2021-05-22 ENCOUNTER — Other Ambulatory Visit: Payer: Self-pay | Admitting: Internal Medicine

## 2021-05-28 NOTE — Progress Notes (Signed)
Subjective:    Patient ID: Brooke [name], female    DOB: 03/13/1951, 70 y.o.   MRN: [id number]  This visit occurred during the SARS-CoV-2 public health emergency.  Safety protocols were in place, including screening questions prior to the visit, additional usage of staff PPE, and extensive cleaning of exam room while observing appropriate contact time as indicated for disinfecting solutions.     HPI The patient is here for follow up of their chronic medical problems, including htn, sleep difficulties, prediabetes  She was seen in the ED 11/23 and here for f/u 12/7.    She takes the lunesta and sleeps around 9-2am - usually does not fall back asleep.  She usually gets out of bed around 5 am.  She does not feel too tired during the day.  She can gets things done she needs to get done.   Overall she states she feels good.    The past few days she had a gas pain in her upper abdomen - it went away and came back and went away again.  She has not had it the past couple days she has not had it.  Jerrye Bushy is controlled.   Her eating is terrible - she does not makes "meals" - will just eat a salad or sandwich.    Her depression and anxiety are getting better.  The evening is hard at times.    [facility]    Medications and allergies reviewed with patient and updated if appropriate.  Patient Active Problem List   Diagnosis Date Noted   Eczema 03/25/2021   Palpitations 10/05/2020   DOE (dyspnea on exertion) 09/20/2020   Grieving 09/04/2020   Rash and nonspecific skin eruption 12/05/2019   GERD (gastroesophageal reflux disease) 06/01/2019   Hot flashes 10/08/2018   Chest tightness 08/24/2018   Pain of left heel 01/11/2018   Sleep difficulties 04/15/2016   Prediabetes 05/07/2015   Memory difficulties 05/07/2015   Bilateral leg edema 05/07/2015   Anemia 10/12/2013   FH: CAD (coronary artery disease) 04/27/2012   OSTEOARTHRITIS 03/30/2008   Hyperlipidemia  07/22/2007   OBESITY NOS 02/22/2007   Allergic rhinitis 02/22/2007   Essential hypertension 02/18/2007    Current Outpatient Medications on File Prior to Visit  Medication Sig Dispense Refill   amLODipine (NORVASC) 10 MG tablet Take 1 tablet by mouth once daily 90 tablet 0   aspirin 81 MG chewable tablet Chew 81 mg by mouth daily.     atenolol (TENORMIN) 25 MG tablet Take 1 tablet by mouth once daily 90 tablet 0   atorvastatin (LIPITOR) 20 MG tablet Take 1 tablet by mouth once daily 90 tablet 0   Eszopiclone 3 MG TABS Take 1 tablet (3 mg total) by mouth at bedtime. Take immediately before bedtime 30 tablet 5   potassium chloride (KLOR-CON) 10 MEQ tablet TAKE 1 TABLET BY MOUTH THREE TIMES DAILY 270 tablet 0   sertraline (ZOLOFT) 100 MG tablet Take 1 tablet (100 mg total) by mouth daily. 90 tablet 1   [DISCONTINUED] potassium chloride (KLOR-CON M10) 10 MEQ tablet Take 1 tablet (10 mEq total) by mouth three times daily. 90 tablet 0   No current facility-administered medications on file prior to visit.    Past Medical History:  Diagnosis Date   FH: CAD (coronary artery disease)    High cholesterol    Hypertension    Memory loss    Obesity  Past Surgical History:  Procedure Laterality Date   CARPAL TUNNEL RELEASE     bilateral   HIP SURGERY      Social History   Socioeconomic History   Marital status: Married    Spouse name: Not on file   Number of children: 2   Years of education: 12th    Highest education level: Not on file  Occupational History   Occupation: Psychologist, sport and exercise for [employer]    Comment: Laid Off  Tobacco Use   Smoking status: Never   Smokeless tobacco: Never  Substance and Sexual Activity   Alcohol use: No   Drug use: No   Sexual activity: Not on file  Other Topics Concern   Not on file  Social History Narrative   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        Social Determinants of Health   Financial  Resource Strain: Not on file  Food Insecurity: Not on file  Transportation Needs: Not on file  Physical Activity: Not on file  Stress: Not on file  Social Connections: Not on file    Family History  Problem Relation Age of Onset   CAD Sister    CAD Father    Kidney disease Sister    Lung cancer Mother    Brain cancer Mother    Colon cancer Neg Hx    Stomach cancer Neg Hx    Pancreatic cancer Neg Hx    Esophageal cancer Neg Hx    Colon polyps Neg Hx    Rectal cancer Neg Hx     Review of Systems  Constitutional:  Negative for chills and fever.  Respiratory:  Negative for cough, shortness of breath and wheezing.   Cardiovascular:  Positive for palpitations (occ). Negative for chest pain and leg swelling.  Gastrointestinal:  Negative for abdominal pain and nausea.  Neurological:  Negative for light-headedness and headaches.      Objective:   Vitals:   05/29/21 0918  BP: 138/68  Pulse: 80  Temp: 98 F (36.7 C)  SpO2: 96%   BP Readings from Last 3 Encounters:  05/29/21 138/68  05/15/21 130/80  05/01/21 128/67   Wt Readings from Last 3 Encounters:  05/29/21 155 lb (70.3 kg)  05/15/21 154 lb 3.2 oz (69.9 kg)  04/30/21 157 lb (71.2 kg)   Body mass index is 29.67 kg/m.   Depression screen [facility] 2/9 05/29/2021 09/04/2020 07/12/2019 10/08/2018 04/21/2018  Decreased Interest 0 3 0 0 0  Down, Depressed, Hopeless 0 3 0 0 0  PHQ - 2 Score 0 6 0 0 0  Altered sleeping 2 3 - - -  Tired, decreased energy 0 3 - - -  Change in appetite 0 3 - - -  Feeling bad or failure about yourself  0 2 - - -  Trouble concentrating 0 3 - - -  Moving slowly or fidgety/restless 0 3 - - -  Suicidal thoughts 0 0 - - -  PHQ-9 Score 2 23 - - -  Difficult doing work/chores Not difficult at all Very difficult - - -    GAD 7 : Generalized Anxiety Score 05/29/2021 09/04/2020  Nervous, Anxious, on Edge 1 3  Control/stop worrying 0 3  Worry too much - different things 0 2  Trouble relaxing 0 3   Restless 0 3  Easily annoyed or irritable 0 0  Afraid - awful might happen 0 3  Total GAD 7 Score  1 17  Anxiety Difficulty Not difficult at all Somewhat difficult      Physical Exam    Constitutional: Appears well-developed and well-nourished. No distress.  HENT:  Head: Normocephalic and atraumatic.  Neck: Neck supple. No tracheal deviation present. No thyromegaly present.  No cervical lymphadenopathy Cardiovascular: Normal rate, regular rhythm and normal heart sounds.   No murmur heard. No carotid bruit .  No edema Pulmonary/Chest: Effort normal and breath sounds normal. No respiratory distress. No has no wheezes. No rales.  Skin: Skin is warm and dry. Not diaphoretic.  Psychiatric: Normal mood and affect. Behavior is normal.      Assessment & Plan:    See Problem List for Assessment and Plan of chronic medical problems.

## 2021-05-29 ENCOUNTER — Other Ambulatory Visit: Payer: Self-pay

## 2021-05-29 ENCOUNTER — Ambulatory Visit (INDEPENDENT_AMBULATORY_CARE_PROVIDER_SITE_OTHER): Payer: Medicare Other | Admitting: Internal Medicine

## 2021-05-29 ENCOUNTER — Encounter: Payer: Self-pay | Admitting: Internal Medicine

## 2021-05-29 VITALS — BP 138/68 | HR 80 | Temp 98.0°F | Ht 60.6 in | Wt 155.0 lb

## 2021-05-29 DIAGNOSIS — Z1331 Encounter for screening for depression: Secondary | ICD-10-CM

## 2021-05-29 DIAGNOSIS — F4321 Adjustment disorder with depressed mood: Secondary | ICD-10-CM | POA: Diagnosis not present

## 2021-05-29 DIAGNOSIS — G479 Sleep disorder, unspecified: Secondary | ICD-10-CM

## 2021-05-29 DIAGNOSIS — I10 Essential (primary) hypertension: Secondary | ICD-10-CM

## 2021-05-29 DIAGNOSIS — R7303 Prediabetes: Secondary | ICD-10-CM | POA: Diagnosis not present

## 2021-05-29 LAB — COMPREHENSIVE METABOLIC PANEL
ALT: 24 U/L (ref 0–35)
AST: 30 U/L (ref 0–37)
Albumin: 3.8 g/dL (ref 3.5–5.2)
Alkaline Phosphatase: 78 U/L (ref 39–117)
BUN: 10 mg/dL (ref 6–23)
CO2: 27 mEq/L (ref 19–32)
Calcium: 10 mg/dL (ref 8.4–10.5)
Chloride: 104 mEq/L (ref 96–112)
Creatinine, Ser: 0.94 mg/dL (ref 0.40–1.20)
GFR: 60.79 mL/min (ref >60.00)
Glucose, Bld: 110 mg/dL — ABNORMAL HIGH (ref 70–99)
Potassium: 3.7 mEq/L (ref 3.5–5.1)
Sodium: 139 mEq/L (ref 135–145)
Total Bilirubin: 0.8 mg/dL (ref 0.2–1.2)
Total Protein: 6.9 g/dL (ref 6.0–8.3)

## 2021-05-29 LAB — CBC WITH DIFFERENTIAL/PLATELET
Basophils Absolute: 0.1 10*3/uL (ref 0.0–0.1)
Basophils Relative: 1.2 % (ref 0.0–3.0)
Eosinophils Absolute: 0.1 10*3/uL (ref 0.0–0.7)
Eosinophils Relative: 1.5 % (ref 0.0–5.0)
HCT: 35.9 % — ABNORMAL LOW (ref 36.0–46.0)
Hemoglobin: 11.4 g/dL — ABNORMAL LOW (ref 12.0–15.0)
Lymphocytes Relative: 11.7 % — ABNORMAL LOW (ref 12.0–46.0)
Lymphs Abs: 1 10*3/uL (ref 0.7–4.0)
MCHC: 31.7 g/dL (ref 30.0–36.0)
MCV: 73.9 fl — ABNORMAL LOW (ref 78.0–100.0)
Monocytes Absolute: 0.6 10*3/uL (ref 0.1–1.0)
Monocytes Relative: 7.2 % (ref 3.0–12.0)
Neutro Abs: 6.9 10*3/uL (ref 1.4–7.7)
Neutrophils Relative %: 78.4 % — ABNORMAL HIGH (ref 43.0–77.0)
Platelets: 187 10*3/uL (ref 150.0–400.0)
RBC: 4.86 Mil/uL (ref 3.87–5.11)
RDW: 16.2 % — ABNORMAL HIGH (ref 11.5–15.5)
WBC: 8.8 10*3/uL (ref 4.0–10.5)

## 2021-05-29 LAB — HEMOGLOBIN A1C: Hgb A1c MFr Bld: 6 % (ref 4.6–6.5)

## 2021-05-29 NOTE — Assessment & Plan Note (Signed)
Chronic She is still grieving her husband's death Screening with PHQ-9 and GAD-7 show improvement-depression/anxiety currently well controlled She feels like she is going through the grieving process and is getting a little easier Continue sertraline 100 mg daily She has good family support and does not feel that she needs to speak to anyone at this time

## 2021-05-29 NOTE — Assessment & Plan Note (Signed)
Chronic Check a1c Low sugar / carb diet Stressed regular exercise  

## 2021-05-29 NOTE — Patient Instructions (Addendum)
° ° °  Blood work was ordered.     Medications changes include :   none  Your prescription(s) have been submitted to your pharmacy. Please take as directed and contact our office if you believe you are having problem(s) with the medication(s).   Ideally monitor your BP at home - your goal is less than 140/90.    Please followup in 6 months

## 2021-05-29 NOTE — Assessment & Plan Note (Signed)
Chronic Really started after her husband died Improved with Lunesta 3 mg nightly-she gets a good 5 hours of sleep, sometimes more-she does not really feel tired during the day and is functioning and doing things that she wants to do Continue Lunesta 3 mg nightly

## 2021-05-29 NOTE — Assessment & Plan Note (Signed)
Chronic Maxide was discontinued earlier this month Blood pressure well controlled CMP Continue amlodipine 10 mg daily, atenolol 25 mg daily Encouraged her to monitor her BP at home since she is no longer on the Maxide-May be able to discontinue potassium

## 2021-06-04 ENCOUNTER — Telehealth: Payer: Self-pay | Admitting: Internal Medicine

## 2021-06-04 NOTE — Telephone Encounter (Signed)
Patient states she has diarrhea w/ no appetite since 12-25  Patient is requesting a call back to discuss otc medication recommendation   Patient's next ov is 12-28

## 2021-06-04 NOTE — Telephone Encounter (Signed)
Spoke with patient today and Immodium recommended until she comes in tomorrow for appointment.

## 2021-06-05 ENCOUNTER — Ambulatory Visit: Payer: Medicare Other | Admitting: Emergency Medicine

## 2021-06-11 NOTE — Telephone Encounter (Signed)
Booked patient or tomorrow at 3:40 pm.  Tried to call patient but line was busy and have not been able to reach her.

## 2021-06-11 NOTE — Telephone Encounter (Signed)
Patient calling in..   Patient says she is still experiencing the diarrhea.. patient had OV scheduled w/ Sagardia but cancelled appt  Requesting ov but currently none available until Friday.. please call [phone]

## 2021-06-11 NOTE — Progress Notes (Signed)
Subjective:    Patient ID: Brooke [name], female    DOB: 1950-12-18, 71 y.o.   MRN: [id number]  This visit occurred during the SARS-CoV-2 public health emergency.  Safety protocols were in place, including screening questions prior to the visit, additional usage of staff PPE, and extensive cleaning of exam room while observing appropriate contact time as indicated for disinfecting solutions.    HPI The patient is here for an acute visit.   Diarrhea - it started last week.  It is better-it appeared to have stopped overnight.  The first day it was very watery which lasted a few days and toward the weekend it was more formed.  She has not been eating as much.  Everything she ate went right through her.  Decreased appetite. No fever, abdominal pain / cramping or nausea.  No blood in stool.  Initially stool was black and it turned more brown.  She was taking pepto bismol.    No diarrhea yet today.  No BM today.  She ate minimally today.  She is drinking ginger ale, but probably not drinking enough.   Urine is a brown.    Medications and allergies reviewed with patient and updated if appropriate.  Patient Active Problem List   Diagnosis Date Noted   Eczema 03/25/2021   Palpitations 10/05/2020   DOE (dyspnea on exertion) 09/20/2020   Grieving 09/04/2020   Rash and nonspecific skin eruption 12/05/2019   GERD (gastroesophageal reflux disease) 06/01/2019   Hot flashes 10/08/2018   Chest tightness 08/24/2018   Pain of left heel 01/11/2018   Sleep difficulties 04/15/2016   Prediabetes 05/07/2015   Memory difficulties 05/07/2015   Bilateral leg edema 05/07/2015   Anemia 10/12/2013   FH: CAD (coronary artery disease) 04/27/2012   OSTEOARTHRITIS 03/30/2008   Hyperlipidemia 07/22/2007   OBESITY NOS 02/22/2007   Allergic rhinitis 02/22/2007   Essential hypertension 02/18/2007    Current Outpatient Medications on File Prior to Visit  Medication Sig Dispense Refill   amLODipine  (NORVASC) 10 MG tablet Take 1 tablet by mouth once daily 90 tablet 0   aspirin 81 MG chewable tablet Chew 81 mg by mouth daily.     atenolol (TENORMIN) 25 MG tablet Take 1 tablet by mouth once daily 90 tablet 0   atorvastatin (LIPITOR) 20 MG tablet Take 1 tablet by mouth once daily 90 tablet 0   Eszopiclone 3 MG TABS Take 1 tablet (3 mg total) by mouth at bedtime. Take immediately before bedtime 30 tablet 5   potassium chloride (KLOR-CON) 10 MEQ tablet TAKE 1 TABLET BY MOUTH THREE TIMES DAILY 270 tablet 0   sertraline (ZOLOFT) 100 MG tablet Take 1 tablet (100 mg total) by mouth daily. 90 tablet 1   [DISCONTINUED] potassium chloride (KLOR-CON M10) 10 MEQ tablet Take 1 tablet (10 mEq total) by mouth three times daily. 90 tablet 0   No current facility-administered medications on file prior to visit.    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        Social Determinants of Health   Financial Resource Strain: Not on file  Food Insecurity: Not on file  Transportation Needs: Not on file  Physical Activity: Not on file  Stress: Not on file  Social Connections: Not on file    Family History   Problem Relation Age of Onset   CAD Sister    CAD Father    Kidney disease Sister    Lung cancer Mother    Brain cancer Mother    Colon cancer Neg Hx    Stomach cancer Neg Hx    Pancreatic cancer Neg Hx    Esophageal cancer Neg Hx    Colon polyps Neg Hx    Rectal cancer Neg Hx     Review of Systems  Constitutional:  Positive for appetite change. Negative for fever.  HENT:  Negative for congestion and sore throat.   Respiratory:  Negative for cough.   Gastrointestinal:  Positive for diarrhea. Negative for abdominal pain, blood in stool and nausea.       No gerd  Neurological:  Negative for dizziness and light-headedness.      Objective:   Vitals:   06/12/21 1551  BP: 140/76  Pulse: (!) 104  Temp: 97.9 F (36.6 C)  SpO2: 98%   BP Readings from Last 3 Encounters:  06/12/21 140/76  05/29/21 138/68  05/15/21 130/80   Wt Readings from Last 3 Encounters:  06/12/21 155 lb (70.3 kg)  05/29/21 155 lb (70.3 kg)  05/15/21 154 lb 3.2 oz (69.9 kg)   Body mass index is 29.67 kg/m.   Physical Exam Constitutional:      General: She is not in acute distress.    Appearance: Normal appearance. She is not ill-appearing.     Comments: Poor skin turgor  HENT:     Head: Normocephalic and atraumatic.  Cardiovascular:     Rate and Rhythm: Normal rate and regular rhythm.     Heart sounds: No murmur heard. Pulmonary:     Effort: Pulmonary effort is normal. No respiratory distress.     Breath sounds: No wheezing or rales.  Abdominal:     General: There is no distension.     Palpations: Abdomen is soft.     Tenderness: There is no abdominal tenderness. There is no guarding or rebound.  Skin:    General: Skin is warm and dry.  Neurological:     Mental Status: She is alert.           Assessment & Plan:    See Problem List for Assessment and Plan of chronic medical problems.

## 2021-06-12 ENCOUNTER — Ambulatory Visit (INDEPENDENT_AMBULATORY_CARE_PROVIDER_SITE_OTHER): Payer: Medicare Other | Admitting: Internal Medicine

## 2021-06-12 ENCOUNTER — Encounter: Payer: Self-pay | Admitting: Internal Medicine

## 2021-06-12 ENCOUNTER — Other Ambulatory Visit: Payer: Self-pay

## 2021-06-12 VITALS — BP 140/76 | HR 104 | Temp 97.9°F | Ht 60.6 in | Wt 155.0 lb

## 2021-06-12 DIAGNOSIS — R197 Diarrhea, unspecified: Secondary | ICD-10-CM | POA: Insufficient documentation

## 2021-06-12 DIAGNOSIS — I10 Essential (primary) hypertension: Secondary | ICD-10-CM

## 2021-06-12 LAB — COMPREHENSIVE METABOLIC PANEL
ALT: 16 U/L (ref 0–35)
AST: 16 U/L (ref 0–37)
Albumin: 4 g/dL (ref 3.5–5.2)
Alkaline Phosphatase: 79 U/L (ref 39–117)
BUN: 9 mg/dL (ref 6–23)
CO2: 28 mEq/L (ref 19–32)
Calcium: 10.1 mg/dL (ref 8.4–10.5)
Chloride: 104 mEq/L (ref 96–112)
Creatinine, Ser: 0.89 mg/dL (ref 0.40–1.20)
GFR: 64.89 mL/min (ref >60.00)
Glucose, Bld: 98 mg/dL (ref 70–99)
Potassium: 3.8 mEq/L (ref 3.5–5.1)
Sodium: 141 mEq/L (ref 135–145)
Total Bilirubin: 0.6 mg/dL (ref 0.2–1.2)
Total Protein: 7.1 g/dL (ref 6.0–8.3)

## 2021-06-12 LAB — CBC WITH DIFFERENTIAL/PLATELET
Basophils Absolute: 0.1 10*3/uL (ref 0.0–0.1)
Basophils Relative: 1.5 % (ref 0.0–3.0)
Eosinophils Absolute: 0.1 10*3/uL (ref 0.0–0.7)
Eosinophils Relative: 1.3 % (ref 0.0–5.0)
HCT: 37.4 % (ref 36.0–46.0)
Hemoglobin: 11.9 g/dL — ABNORMAL LOW (ref 12.0–15.0)
Lymphocytes Relative: 16.5 % (ref 12.0–46.0)
Lymphs Abs: 1.5 10*3/uL (ref 0.7–4.0)
MCHC: 31.9 g/dL (ref 30.0–36.0)
MCV: 73.3 fl — ABNORMAL LOW (ref 78.0–100.0)
Monocytes Absolute: 0.9 10*3/uL (ref 0.1–1.0)
Monocytes Relative: 9.6 % (ref 3.0–12.0)
Neutro Abs: 6.4 10*3/uL (ref 1.4–7.7)
Neutrophils Relative %: 71.1 % (ref 43.0–77.0)
Platelets: 230 10*3/uL (ref 150.0–400.0)
RBC: 5.1 Mil/uL (ref 3.87–5.11)
RDW: 17.1 % — ABNORMAL HIGH (ref 11.5–15.5)
WBC: 8.9 10*3/uL (ref 4.0–10.5)

## 2021-06-12 NOTE — Assessment & Plan Note (Signed)
Acute Started last week-diarrhea improved No associated abdominal pain, nausea or fever or other symptoms She did have some black stool, but she was also taking Pepto-Bismol and the stool has now turned brown She is likely dehydrated-poor skin turgor, feels shaky Still has decreased appetite Will check CMP, CBC Can take Pepto-Bismol as needed, but has not taken it in a day or 2 and would advise trying to avoid it if possible Stressed increase fluids-discussed dangers of dehydration Start bland diet-small meals and increase as tolerated Advised her to call in the next couple days if her symptoms or not improving or if she has any questions

## 2021-06-12 NOTE — Assessment & Plan Note (Signed)
Chronic Blood pressure okay today She is clinically dehydrated, but since her blood pressure is okay we will continue her atenolol 25 mg daily and amlodipine 10 mg daily

## 2021-06-12 NOTE — Patient Instructions (Signed)
Blood work was ordered.     Medications changes include :   none   Diarrhea, Adult Diarrhea is frequent loose and watery bowel movements. Diarrhea can make you feel weak and cause you to become dehydrated. Dehydration can make you tired and thirsty, cause you to have a dry mouth, and decrease how often you urinate. Diarrhea typically lasts 2-3 days. However, it can last longer if it is a sign of something more serious. It is important to treat your diarrhea as told by your health care provider. Follow these instructions at home: Eating and drinking   Follow these recommendations as told by your health care provider: Take an oral rehydration solution (ORS). This is an over-the-counter medicine that helps return your body to its normal balance of nutrients and water. It is found at pharmacies and retail stores. Drink plenty of fluids, such as water, ice chips, diluted fruit juice, and low-calorie sports drinks. You can drink milk also, if desired. Avoid drinking fluids that contain a lot of sugar or caffeine, such as energy drinks, sports drinks, and soda. Eat bland, easy-to-digest foods in small amounts as you are able. These foods include bananas, applesauce, rice, lean meats, toast, and crackers. Avoid alcohol. Avoid spicy or fatty foods.  Medicines Take over-the-counter and prescription medicines only as told by your health care provider. If you were prescribed an antibiotic medicine, take it as told by your health care provider. Do not stop using the antibiotic even if you start to feel better. General instructions  Wash your hands often using soap and water. If soap and water are not available, use a hand sanitizer. Others in the household should wash their hands as well. Hands should be washed: After using the toilet or changing a diaper. Before preparing, cooking, or serving food. While caring for a sick person or while visiting someone in a hospital. Drink enough fluid to  keep your urine pale yellow. Rest at home while you recover. Watch your condition for any changes. Take a warm bath to relieve any burning or pain from frequent diarrhea episodes. Keep all follow-up visits as told by your health care provider. This is important. Contact a health care provider if: You have a fever. Your diarrhea gets worse. You have new symptoms. You cannot keep fluids down. You feel light-headed or dizzy. You have a headache. You have muscle cramps. Get help right away if: You have chest pain. You feel extremely weak or you faint. You have bloody or black stools or stools that look like tar. You have severe pain, cramping, or bloating in your abdomen. You have trouble breathing or you are breathing very quickly. Your heart is beating very quickly. Your skin feels cold and clammy. You feel confused. You have signs of dehydration, such as: Dark urine, very little urine, or no urine. Cracked lips. Dry mouth. Sunken eyes. Sleepiness. Weakness. Summary Diarrhea is frequent loose and sometimes watery bowel movements. Diarrhea can make you feel weak and cause you to become dehydrated. Drink enough fluids to keep your urine pale yellow. Make sure that you wash your hands after using the toilet. If soap and water are not available, use hand sanitizer. Contact a health care provider if your diarrhea gets worse or you have new symptoms. Get help right away if you have signs of dehydration. This information is not intended to replace advice given to you by your health care provider. Make sure you discuss any questions you have with your health  care provider. Document Revised: 12/05/2020 Document Reviewed: 12/05/2020 Elsevier Patient Education  [place].

## 2021-06-14 ENCOUNTER — Ambulatory Visit (HOSPITAL_COMMUNITY)
Admission: EM | Admit: 2021-06-14 | Discharge: 2021-06-14 | Disposition: A | Discharge status: Home / Self Care | Payer: Medicare Other | Attending: Family Medicine | Admitting: Family Medicine

## 2021-06-14 ENCOUNTER — Encounter (HOSPITAL_COMMUNITY): Payer: Self-pay

## 2021-06-14 ENCOUNTER — Ambulatory Visit (INDEPENDENT_AMBULATORY_CARE_PROVIDER_SITE_OTHER): Payer: Medicare Other

## 2021-06-14 ENCOUNTER — Telehealth: Payer: Self-pay

## 2021-06-14 ENCOUNTER — Other Ambulatory Visit: Payer: Self-pay

## 2021-06-14 DIAGNOSIS — K5900 Constipation, unspecified: Secondary | ICD-10-CM | POA: Diagnosis not present

## 2021-06-14 DIAGNOSIS — R140 Abdominal distension (gaseous): Secondary | ICD-10-CM | POA: Diagnosis not present

## 2021-06-14 DIAGNOSIS — Z96642 Presence of left artificial hip joint: Secondary | ICD-10-CM | POA: Diagnosis not present

## 2021-06-14 NOTE — ED Provider Notes (Signed)
[facility]    CSN: [id number] Arrival date & time: 06/14/21  1006      History   Chief Complaint Chief Complaint  Patient presents with   Constipation    HPI Brooke [name] is a 71 y.o. female.    Constipation Here for abdominal bloating and no BM since 1/2. Prior to that she had had diarrhea/loose stools, 2-3 times daily, for about 4 days. No f/c/n/v in that time. Also no URI symptoms.   Today she at first noted some abd pain in the last few days, but to me only notes feeling bloated. No new n/v.   No blood in stool, at first the diarrhea was dark.  Turns out she has taken imodium in the last 2-3 days, thinking it would make her start having a BM.  Past Medical History:  Diagnosis Date   FH: CAD (coronary artery disease)    High cholesterol    Hypertension    Memory loss    Obesity     Patient Active Problem List   Diagnosis Date Noted   Diarrhea of presumed infectious origin 06/12/2021   Eczema 03/25/2021   Palpitations 10/05/2020   DOE (dyspnea on exertion) 09/20/2020   Grieving 09/04/2020   Rash and nonspecific skin eruption 12/05/2019   GERD (gastroesophageal reflux disease) 06/01/2019   Hot flashes 10/08/2018   Chest tightness 08/24/2018   Pain of left heel 01/11/2018   Sleep difficulties 04/15/2016   Prediabetes 05/07/2015   Memory difficulties 05/07/2015   Bilateral leg edema 05/07/2015   Anemia 10/12/2013   FH: CAD (coronary artery disease) 04/27/2012   OSTEOARTHRITIS 03/30/2008   Hyperlipidemia 07/22/2007   OBESITY NOS 02/22/2007   Allergic rhinitis 02/22/2007   Essential hypertension 02/18/2007    Past Surgical History:  Procedure Laterality Date   CARPAL TUNNEL RELEASE     bilateral   HIP SURGERY      OB History   No obstetric history on file.      Home Medications    Prior to Admission medications   Medication Sig Start Date End Date Taking? Authorizing Provider  amLODipine (NORVASC) 10 MG tablet Take 1 tablet  by mouth once daily 05/21/21   Binnie Rail, MD  aspirin 81 MG chewable tablet Chew 81 mg by mouth daily.    [provider]  atenolol (TENORMIN) 25 MG tablet Take 1 tablet by mouth once daily 08/16/20   Binnie Rail, MD  atorvastatin (LIPITOR) 20 MG tablet Take 1 tablet by mouth once daily 05/21/21   Binnie Rail, MD  Eszopiclone 3 MG TABS Take 1 tablet (3 mg total) by mouth at bedtime. Take immediately before bedtime 04/30/21   Binnie Rail, MD  potassium chloride (KLOR-CON) 10 MEQ tablet TAKE 1 TABLET BY MOUTH THREE TIMES DAILY 03/14/21   Binnie Rail, MD  sertraline (ZOLOFT) 100 MG tablet Take 1 tablet (100 mg total) by mouth daily. 04/30/21   Binnie Rail, MD  potassium chloride (KLOR-CON M10) 10 MEQ tablet Take 1 tablet (10 mEq total) by mouth three times daily. 07/01/19 08/01/19  Binnie Rail, MD    Family History Family History  Problem Relation Age of Onset   CAD Sister    CAD Father    Kidney disease Sister    Lung cancer Mother    Brain cancer Mother    Colon cancer Neg Hx    Stomach cancer Neg Hx    Pancreatic cancer Neg Hx  Esophageal cancer Neg Hx    Colon polyps Neg Hx    Rectal cancer Neg Hx     Social History Social History   Tobacco Use   Smoking status: Never   Smokeless tobacco: Never  Substance Use Topics   Alcohol use: No   Drug use: No     Allergies   Penicillins   Review of Systems Review of Systems  Gastrointestinal:  Positive for constipation.    Physical Exam Triage Vital Signs ED Triage Vitals [06/14/21 1123]  Enc Vitals Group     BP (!) 175/83     Pulse Rate 88     Resp 18     Temp 98.5 F (36.9 C)     Temp Source Oral     SpO2 98 %     Weight      Height      Head Circumference      Peak Flow      Pain Score 0     Pain Loc      Pain Edu?      Excl. in [place]?    No data found.  Updated Vital Signs BP (!) 175/83 (BP Location: Left Arm)    Pulse 88    Temp 98.5 F (36.9 C) (Oral)    Resp 18    SpO2 98%    Visual Acuity Right Eye Distance:   Left Eye Distance:   Bilateral Distance:    Right Eye Near:   Left Eye Near:    Bilateral Near:     Physical Exam Vitals reviewed.  Constitutional:      General: She is not in acute distress.    Appearance: She is not toxic-appearing.  HENT:     Mouth/Throat:     Mouth: Mucous membranes are moist.  Cardiovascular:     Rate and Rhythm: Normal rate and regular rhythm.     Heart sounds: No murmur heard. Pulmonary:     Breath sounds: Normal breath sounds.  Abdominal:     Palpations: Abdomen is soft. There is no mass.     Tenderness: There is no abdominal tenderness.     Hernia: No hernia is present.     Comments: BS quiet  Skin:    Coloration: Skin is not jaundiced or pale.  Neurological:     Mental Status: She is alert and oriented to person, place, and time.  Psychiatric:        Behavior: Behavior normal.     UC Treatments / Results  Labs (all labs ordered are listed, but only abnormal results are displayed) Labs Reviewed - No data to display  EKG   Radiology DG Abd 2 Views  Result Date: 06/14/2021 CLINICAL DATA:  Bloating and constipation. EXAM: ABDOMEN-2 VIEW COMPARISON:  None. FINDINGS: The bowel gas pattern is normal. There is no evidence of free air. Moderate amount of stool within the cecum. Remaining stool burden within normal limits. Multiple large calcified uterine fibroids in the pelvis. No radio-opaque calculi or other significant radiographic abnormality is seen. Prior left total hip arthroplasty. No acute osseous abnormality. IMPRESSION: 1. No acute findings. Moderate amount of stool within the cecum. Electronically Signed   By: Titus Dubin M.D.   On: 06/14/2021 11:58    Procedures Procedures (including critical care time)  Medications Ordered in UC Medications - No data to display  Initial Impression / Assessment and Plan / UC Course  I have reviewed the triage vital signs and the nursing  notes.  Pertinent labs & imaging results that were available during my care of the patient were reviewed by me and considered in my medical decision making (see chart for details).     2 view abd xray shows a good bit of stool, and no AF levels. And no free air.  Final Clinical Impressions(s) / UC Diagnoses   Final diagnoses:  Constipation, unspecified constipation type     Discharge Instructions      Your xray shows a lot of stool, but no sign of obstruction.  Start taking miralax daily; it can take a few days for it to work. You could also do a glycerin suppository or a dulcolax suppository over the counter to see if it would help faster.  For sure, stop taking pepto and stop taking imodium. They are more for diarrhea and upset stomach.     ED Prescriptions   None    PDMP not reviewed this encounter.   Barrett Henle, MD 06/14/21 [phone]

## 2021-06-14 NOTE — Telephone Encounter (Signed)
Left message for patient today with recommendations.  If she calls back please see if she can be seen at another office location or have her go to Urgent Care.

## 2021-06-14 NOTE — Discharge Instructions (Signed)
Your xray shows a lot of stool, but no sign of obstruction.  Start taking miralax daily; it can take a few days for it to work. You could also do a glycerin suppository or a dulcolax suppository over the counter to see if it would help faster.  For sure, stop taking pepto and stop taking imodium. They are more for diarrhea and upset stomach.

## 2021-06-14 NOTE — ED Triage Notes (Signed)
Pt states had diarrhea last week and saw her PCP and it went away. States now constipated with center abdominal pain x2 days. Last NBM was 2 days ago. States sent here from PCP.

## 2021-06-14 NOTE — Telephone Encounter (Signed)
Would recommend she is seen - UC or other office.

## 2021-06-14 NOTE — Telephone Encounter (Signed)
Pt calling in stating she haven't had a BM since 06/12/21. Pt had saw you on the same day 06/12/21. Pt is also stating that she has black strikes on her tougue starting at the back coming toward the front. Pt states that she is nervous and shaking.  I offer the pt the first available appt and she declines at this time stating she needs to be seen today. I then advised urgent care.   Please contact pt with further recommendations at [phone]

## 2021-06-19 ENCOUNTER — Other Ambulatory Visit: Payer: Self-pay | Admitting: Internal Medicine

## 2021-06-24 DIAGNOSIS — H00025 Hordeolum internum left lower eyelid: Secondary | ICD-10-CM | POA: Diagnosis not present

## 2021-06-27 ENCOUNTER — Encounter: Payer: Self-pay | Admitting: Internal Medicine

## 2021-06-27 NOTE — Progress Notes (Signed)
Subjective:    Patient ID: Brooke [name], female    DOB: December 03, 1950, 71 y.o.   MRN: [id number]  This visit occurred during the SARS-CoV-2 public health emergency.  Safety protocols were in place, including screening questions prior to the visit, additional usage of staff PPE, and extensive cleaning of exam room while observing appropriate contact time as indicated for disinfecting solutions.    HPI The patient is here for an acute visit for stomach issues, pain, shaking and nervous.  The symptoms have been going on for a little while and are not new.  All of them started since her husband passed, But is difficult to say exactly when they started.   Tremor: She has shaking intermittently - occurs at rest. Better with distraction.  Did not get worse with increase in sertraline.    Soft stools: Most of the time she eats she has to run to the bathroom.  It does not matter what she eats.  It eases up at times but comes right back.  She had diarrhea the beginning of the month and that resolved.  Initially it was very loose.  It did get more solid.  Now it is soft, semiformed.  Having 3-4 times a day.  She denies any nausea or abdominal pain.  Heart racing: Her heart races with mild exertion.   Saw cardio 4/22 for similar symptoms and an echocardiogram EKG was done at that time.  Heart racing started just after her husband died and it was thought to be related to that.    No regular exercise -just active.     She is not eating like she used to prior to her husband dying.  She knows she is probably not getting everything she needs from her diet.  She has lost weight.    Medications and allergies reviewed with patient and updated if appropriate.  Patient Active Problem List   Diagnosis Date Noted   Diarrhea of presumed infectious origin 06/12/2021   Eczema 03/25/2021   Palpitations 10/05/2020   DOE (dyspnea on exertion) 09/20/2020   Grieving 09/04/2020   Rash and nonspecific skin  eruption 12/05/2019   GERD (gastroesophageal reflux disease) 06/01/2019   Hot flashes 10/08/2018   Chest tightness 08/24/2018   Pain of left heel 01/11/2018   Sleep difficulties 04/15/2016   Prediabetes 05/07/2015   Memory difficulties 05/07/2015   Bilateral leg edema 05/07/2015   Anemia 10/12/2013   FH: CAD (coronary artery disease) 04/27/2012   OSTEOARTHRITIS 03/30/2008   Hyperlipidemia 07/22/2007   OBESITY NOS 02/22/2007   Allergic rhinitis 02/22/2007   Essential hypertension 02/18/2007    Current Outpatient Medications on File Prior to Visit  Medication Sig Dispense Refill   amLODipine (NORVASC) 10 MG tablet Take 1 tablet by mouth once daily 90 tablet 0   aspirin 81 MG chewable tablet Chew 81 mg by mouth daily.     atenolol (TENORMIN) 25 MG tablet Take 1 tablet by mouth once daily 90 tablet 0   atorvastatin (LIPITOR) 20 MG tablet Take 1 tablet by mouth once daily 90 tablet 0   Eszopiclone 3 MG TABS Take 1 tablet (3 mg total) by mouth at bedtime. Take immediately before bedtime 30 tablet 5   methylPREDNISolone (MEDROL DOSEPAK) 4 MG TBPK tablet See admin instructions.     neomycin-polymyxin b-dexamethasone (MAXITROL) 3.5-10000-0.1 OINT      potassium chloride (KLOR-CON) 10 MEQ tablet TAKE 1 TABLET BY MOUTH THREE TIMES DAILY 270 tablet 0   sertraline (ZOLOFT)  100 MG tablet Take 1 tablet (100 mg total) by mouth daily. 90 tablet 1   sulfamethoxazole-trimethoprim (BACTRIM DS) 800-160 MG tablet Take 1 tablet by mouth 2 (two) times daily.     [DISCONTINUED] potassium chloride (KLOR-CON M10) 10 MEQ tablet Take 1 tablet (10 mEq total) by mouth three times daily. 90 tablet 0   No current facility-administered medications on file prior to visit.    Past Medical History:  Diagnosis Date   FH: CAD (coronary artery disease)    High cholesterol    Hypertension    Memory loss    Obesity     Past Surgical History:  Procedure Laterality Date   CARPAL TUNNEL RELEASE     bilateral    HIP SURGERY      Social History   Socioeconomic History   Marital status: Married    Spouse name: Not on file   Number of children: 2   Years of education: 12th    Highest education level: Not on file  Occupational History   Occupation: Psychologist, sport and exercise for [employer]    Comment: Laid Off  Tobacco Use   Smoking status: Never   Smokeless tobacco: Never  Substance and Sexual Activity   Alcohol use: No   Drug use: No   Sexual activity: Not on file  Other Topics Concern   Not on file  Social History Narrative   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        Social Determinants of Health   Financial Resource Strain: Not on file  Food Insecurity: Not on file  Transportation Needs: Not on file  Physical Activity: Not on file  Stress: Not on file  Social Connections: Not on file    Family History  Problem Relation Age of Onset   CAD Sister    CAD Father    Kidney disease Sister    Lung cancer Mother    Brain cancer Mother    Colon cancer Neg Hx    Stomach cancer Neg Hx    Pancreatic cancer Neg Hx    Esophageal cancer Neg Hx    Colon polyps Neg Hx    Rectal cancer Neg Hx     Review of Systems  Constitutional:  Negative for chills and fever.  Respiratory:  Negative for cough, shortness of breath, wheezing and stridor.   Cardiovascular:  Positive for palpitations (with exertion). Negative for chest pain and leg swelling.  Gastrointestinal:  Negative for abdominal distention, abdominal pain, blood in stool (no melena) and nausea.       No gerd  Neurological:  Positive for tremors. Negative for light-headedness and headaches.      Objective:   Vitals:   06/28/21 1052  BP: 130/60  Pulse: 97  Temp: 98.1 F (36.7 C)  SpO2: 98%   BP Readings from Last 3 Encounters:  06/28/21 130/60  06/14/21 (!) 175/83  06/12/21 140/76   Wt Readings from Last 3 Encounters:  06/28/21 146 lb 3.2 oz (66.3 kg)  06/12/21 155 lb (70.3 kg)   05/29/21 155 lb (70.3 kg)   Body mass index is 27.99 kg/m.   Physical Exam    Constitutional: Appears well-developed and well-nourished. No distress.  Head: Normocephalic and atraumatic.  Neck: Neck supple. No tracheal deviation present. No thyromegaly present.  No cervical lymphadenopathy Cardiovascular: Normal rate, regular rhythm and normal heart sounds.  No murmur heard. No carotid bruit .  No edema Pulmonary/Chest: Effort normal  and breath sounds normal. No respiratory distress. No has no wheezes. No rales. Abdomen: Soft, nondistended, increased bowel sounds, nontender Skin: Skin is warm and dry. Not diaphoretic.  Psychiatric: Depressed mood and affect. Behavior is normal.       Assessment & Plan:    See Problem List for Assessment and Plan of chronic medical problems.

## 2021-06-28 ENCOUNTER — Other Ambulatory Visit: Payer: Self-pay

## 2021-06-28 ENCOUNTER — Ambulatory Visit (INDEPENDENT_AMBULATORY_CARE_PROVIDER_SITE_OTHER): Payer: Medicare Other | Admitting: Internal Medicine

## 2021-06-28 DIAGNOSIS — R000 Tachycardia, unspecified: Secondary | ICD-10-CM | POA: Diagnosis not present

## 2021-06-28 DIAGNOSIS — R251 Tremor, unspecified: Secondary | ICD-10-CM | POA: Diagnosis not present

## 2021-06-28 DIAGNOSIS — I10 Essential (primary) hypertension: Secondary | ICD-10-CM | POA: Diagnosis not present

## 2021-06-28 DIAGNOSIS — R197 Diarrhea, unspecified: Secondary | ICD-10-CM | POA: Insufficient documentation

## 2021-06-28 DIAGNOSIS — F4321 Adjustment disorder with depressed mood: Secondary | ICD-10-CM

## 2021-06-28 NOTE — Assessment & Plan Note (Signed)
Acute Had episode of diarrhea earlier this month and now is having frequent/soft semiformed stools a few times a day No abdominal pain, nausea or bloating Likely functional in nature Typically has to go right after eating Colonoscopy 10/2019 reviewed Start probiotics and fiber supplement to see if that helps

## 2021-06-28 NOTE — Assessment & Plan Note (Signed)
Acute Has been experiencing the feeling of her heart racing with exertion since her husband died almost a year ago She did see cardiology last April and an EKG and echocardiogram were unremarkable She is not exercising regularly so some of this could be related to deconditioning Did not get worse with increasing sertraline Blood work has been normal Encourage more regular exercise Can consider Holter monitor or referral back to cardiology, but will hold off for now and she will monitor

## 2021-06-28 NOTE — Patient Instructions (Addendum)
° ° ° °  Medications changes include :  try probiotics and a fiber supplement - benefiber or metamucil.     Keep up hydration.   Exercise regularly.   Do the best you can with your diet.  Try to get enough protein and vegetables.

## 2021-06-28 NOTE — Assessment & Plan Note (Signed)
She is still grieving her husband's death, but she feels she is doing well Sleep is a little bit better with Lunesta 3 mg at night we will continue She does have some anxiety and depression, but feels they are fairly controlled with her current medication and improving Continue sertraline 100 mg daily

## 2021-06-28 NOTE — Assessment & Plan Note (Signed)
Chronic Blood pressure well controlled CMP Continue amlodipine 10 mg daily, atenolol 25 mg daily 

## 2021-06-28 NOTE — Assessment & Plan Note (Signed)
Acute Has been experiencing a tremor for a while Did start after her husband's passing and most likely is related to that She has been going through a lot since then including increased anxiety and depression secondary to grieving Tremor did not get worse or obviously start with sertraline so unlikely medication related Benign essential tremor or Parkinson's is possible  She is still going through a lot dealing with her husband's death and is still experiencing anxiety, which may be contributing  Discussed that we can have her see neurology for further evaluation or monitor and she would like to monitor for now If tremor persists or worsens would recommend neurology evaluation

## 2021-07-02 DIAGNOSIS — H10013 Acute follicular conjunctivitis, bilateral: Secondary | ICD-10-CM | POA: Diagnosis not present

## 2021-07-02 DIAGNOSIS — H00024 Hordeolum internum left upper eyelid: Secondary | ICD-10-CM | POA: Diagnosis not present

## 2021-07-05 ENCOUNTER — Other Ambulatory Visit: Payer: Self-pay | Admitting: Internal Medicine

## 2021-07-13 DIAGNOSIS — H16143 Punctate keratitis, bilateral: Secondary | ICD-10-CM | POA: Diagnosis not present

## 2021-07-16 ENCOUNTER — Encounter: Payer: Self-pay | Admitting: Internal Medicine

## 2021-07-16 ENCOUNTER — Telehealth: Payer: Self-pay

## 2021-07-16 DIAGNOSIS — K921 Melena: Secondary | ICD-10-CM | POA: Insufficient documentation

## 2021-07-16 NOTE — Telephone Encounter (Signed)
If there are large amounts of black stool or she has any lightheadedness/dizziness or low BP if she checks it she should go to the ED.  If not ok to schedule tomorrow with me at 240

## 2021-07-16 NOTE — Telephone Encounter (Signed)
Spoke with patient today. Appointment made for tomorrow. She only had a small amount of black stool but no dizziness or lightheaded  or low BP.

## 2021-07-16 NOTE — Telephone Encounter (Signed)
Pt is very concern about having Black stool this morning.  Please advise pt on what to do: [phone]

## 2021-07-16 NOTE — Progress Notes (Signed)
Subjective:    Patient ID: Brooke [name], female    DOB: 03/22/1951, 71 y.o.   MRN: [id number]  This visit occurred during the SARS-CoV-2 public health emergency.  Safety protocols were in place, including screening questions prior to the visit, additional usage of staff PPE, and extensive cleaning of exam room while observing appropriate contact time as indicated for disinfecting solutions.    HPI The patient is here for an acute visit.  Black stools:  2 nights ago she woke up in the middle of the night and went ot the bathroom and her stool wa sblack.  The stool was loose.  This morning she had another BM and the stool was balck.  She denies any new foods.  She denies otc iron or pepto bismol.  She denies gerd, nausea, burping or abdominal pain.  Not taking any nsaids.     Medications and allergies reviewed with patient and updated if appropriate.  Patient Active Problem List   Diagnosis Date Noted   Black stools 07/16/2021   Tremor 06/28/2021   Frequent loose stools 06/28/2021   Racing heart beat 06/28/2021   Diarrhea of presumed infectious origin 06/12/2021   Eczema 03/25/2021   Palpitations 10/05/2020   DOE (dyspnea on exertion) 09/20/2020   Grieving 09/04/2020   Rash and nonspecific skin eruption 12/05/2019   Hot flashes 10/08/2018   Chest tightness 08/24/2018   Pain of left heel 01/11/2018   Sleep difficulties 04/15/2016   Prediabetes 05/07/2015   Memory difficulties 05/07/2015   Bilateral leg edema 05/07/2015   Anemia 10/12/2013   FH: CAD (coronary artery disease) 04/27/2012   OSTEOARTHRITIS 03/30/2008   Hyperlipidemia 07/22/2007   OBESITY NOS 02/22/2007   Allergic rhinitis 02/22/2007   Essential hypertension 02/18/2007    Current Outpatient Medications on File Prior to Visit  Medication Sig Dispense Refill   amLODipine (NORVASC) 10 MG tablet Take 1 tablet by mouth once daily 90 tablet 0   aspirin 81 MG chewable tablet Chew 81 mg by mouth daily.      atenolol (TENORMIN) 25 MG tablet Take 1 tablet by mouth once daily 90 tablet 0   atorvastatin (LIPITOR) 20 MG tablet Take 1 tablet by mouth once daily 90 tablet 0   Eszopiclone 3 MG TABS Take 1 tablet (3 mg total) by mouth at bedtime. Take immediately before bedtime 30 tablet 5   neomycin-polymyxin b-dexamethasone (MAXITROL) 3.5-10000-0.1 OINT      potassium chloride (KLOR-CON) 10 MEQ tablet TAKE 1 TABLET BY MOUTH THREE TIMES DAILY 270 tablet 0   sertraline (ZOLOFT) 100 MG tablet Take 1 tablet (100 mg total) by mouth daily. 90 tablet 1   prednisoLONE acetate (PRED FORTE) 1 % ophthalmic suspension 1 drop 4 (four) times daily.     valACYclovir (VALTREX) 1000 MG tablet Take 1,000 mg by mouth 3 (three) times daily.     [DISCONTINUED] potassium chloride (KLOR-CON M10) 10 MEQ tablet Take 1 tablet (10 mEq total) by mouth three times daily. 90 tablet 0   No current facility-administered medications on file prior to visit.    Past Medical History:  Diagnosis Date   FH: CAD (coronary artery disease)    High cholesterol    Hypertension    Memory loss    Obesity     Past Surgical History:  Procedure Laterality Date   CARPAL TUNNEL RELEASE     bilateral   HIP SURGERY      Social History   Socioeconomic History   Marital  status: Married    Spouse name: Not on file   Number of children: 2   Years of education: 12th    Highest education level: Not on file  Occupational History   Occupation: Psychologist, sport and exercise for [employer]    Comment: Laid Off  Tobacco Use   Smoking status: Never   Smokeless tobacco: Never  Substance and Sexual Activity   Alcohol use: No   Drug use: No   Sexual activity: Not on file  Other Topics Concern   Not on file  Social History Narrative   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        Social Determinants of Health   Financial Resource Strain: Not on file  Food Insecurity: Not on file  Transportation Needs: Not on  file  Physical Activity: Not on file  Stress: Not on file  Social Connections: Not on file    Family History  Problem Relation Age of Onset   CAD Sister    CAD Father    Kidney disease Sister    Lung cancer Mother    Brain cancer Mother    Colon cancer Neg Hx    Stomach cancer Neg Hx    Pancreatic cancer Neg Hx    Esophageal cancer Neg Hx    Colon polyps Neg Hx    Rectal cancer Neg Hx     Review of Systems  Constitutional:  Negative for fever.  Respiratory:  Negative for shortness of breath.   Gastrointestinal:  Negative for abdominal pain, blood in stool, constipation, diarrhea and nausea.       No gerd  Neurological:  Negative for dizziness and light-headedness.      Objective:   Vitals:   07/17/21 1512  BP: 126/80  Pulse: 70  Temp: 98.4 F (36.9 C)  SpO2: 98%   BP Readings from Last 3 Encounters:  07/17/21 126/80  06/28/21 130/60  06/14/21 (!) 175/83   Wt Readings from Last 3 Encounters:  07/17/21 148 lb 12.8 oz (67.5 kg)  06/28/21 146 lb 3.2 oz (66.3 kg)  06/12/21 155 lb (70.3 kg)   Body mass index is 28.49 kg/m.   Physical Exam Constitutional:      General: She is not in acute distress.    Appearance: Normal appearance. She is not ill-appearing.  HENT:     Head: Normocephalic and atraumatic.  Cardiovascular:     Rate and Rhythm: Normal rate and regular rhythm.  Pulmonary:     Effort: Pulmonary effort is normal. No respiratory distress.     Breath sounds: No wheezing or rales.  Abdominal:     General: There is no distension.     Palpations: Abdomen is soft.     Tenderness: There is no abdominal tenderness. There is no guarding or rebound.  Musculoskeletal:     Right lower leg: No edema.     Left lower leg: No edema.  Skin:    General: Skin is warm and dry.  Neurological:     Mental Status: She is alert.           Assessment & Plan:    See Problem List for Assessment and Plan of chronic medical problems.

## 2021-07-17 ENCOUNTER — Ambulatory Visit (INDEPENDENT_AMBULATORY_CARE_PROVIDER_SITE_OTHER): Payer: Medicare Other | Admitting: Internal Medicine

## 2021-07-17 ENCOUNTER — Other Ambulatory Visit: Payer: Self-pay

## 2021-07-17 VITALS — BP 126/80 | HR 70 | Temp 98.4°F | Ht 60.6 in | Wt 148.8 lb

## 2021-07-17 DIAGNOSIS — K921 Melena: Secondary | ICD-10-CM | POA: Diagnosis not present

## 2021-07-17 DIAGNOSIS — I10 Essential (primary) hypertension: Secondary | ICD-10-CM | POA: Diagnosis not present

## 2021-07-17 DIAGNOSIS — D649 Anemia, unspecified: Secondary | ICD-10-CM | POA: Diagnosis not present

## 2021-07-17 LAB — CBC WITH DIFFERENTIAL/PLATELET
Basophils Absolute: 0.1 10*3/uL (ref 0.0–0.1)
Basophils Relative: 1.2 % (ref 0.0–3.0)
Eosinophils Absolute: 0.1 10*3/uL (ref 0.0–0.7)
Eosinophils Relative: 1.7 % (ref 0.0–5.0)
HCT: 35 % — ABNORMAL LOW (ref 36.0–46.0)
Hemoglobin: 11 g/dL — ABNORMAL LOW (ref 12.0–15.0)
Lymphocytes Relative: 24.9 % (ref 12.0–46.0)
Lymphs Abs: 2.2 10*3/uL (ref 0.7–4.0)
MCHC: 31.3 g/dL (ref 30.0–36.0)
MCV: 74 fl — ABNORMAL LOW (ref 78.0–100.0)
Monocytes Absolute: 0.8 10*3/uL (ref 0.1–1.0)
Monocytes Relative: 8.9 % (ref 3.0–12.0)
Neutro Abs: 5.6 10*3/uL (ref 1.4–7.7)
Neutrophils Relative %: 63.3 % (ref 43.0–77.0)
Platelets: 130 10*3/uL — ABNORMAL LOW (ref 150.0–400.0)
RBC: 4.73 Mil/uL (ref 3.87–5.11)
RDW: 16.7 % — ABNORMAL HIGH (ref 11.5–15.5)
WBC: 8.9 10*3/uL (ref 4.0–10.5)

## 2021-07-17 LAB — COMPREHENSIVE METABOLIC PANEL
ALT: 23 U/L (ref 0–35)
AST: 20 U/L (ref 0–37)
Albumin: 3.5 g/dL (ref 3.5–5.2)
Alkaline Phosphatase: 90 U/L (ref 39–117)
BUN: 11 mg/dL (ref 6–23)
CO2: 28 mEq/L (ref 19–32)
Calcium: 10 mg/dL (ref 8.4–10.5)
Chloride: 105 mEq/L (ref 96–112)
Creatinine, Ser: 0.91 mg/dL (ref 0.40–1.20)
GFR: 63.14 mL/min (ref >60.00)
Glucose, Bld: 94 mg/dL (ref 70–99)
Potassium: 4.1 mEq/L (ref 3.5–5.1)
Sodium: 140 mEq/L (ref 135–145)
Total Bilirubin: 0.7 mg/dL (ref 0.2–1.2)
Total Protein: 6.4 g/dL (ref 6.0–8.3)

## 2021-07-17 LAB — IBC PANEL
Iron: 46 ug/dL (ref 42–145)
Saturation Ratios: 19.6 % — ABNORMAL LOW (ref 20.0–50.0)
TIBC: 235.2 ug/dL — ABNORMAL LOW (ref 250.0–450.0)
Transferrin: 168 mg/dL — ABNORMAL LOW (ref 212.0–360.0)

## 2021-07-17 LAB — FERRITIN: Ferritin: 384.6 ng/mL — ABNORMAL HIGH (ref 10.0–291.0)

## 2021-07-17 NOTE — Assessment & Plan Note (Signed)
Acute Two stools that were black in color - no other symptoms No obvious cause - no nsaids, iron or pepto ? Related to something she ate VSS Ck iron, ferritin, cbc, cmp Fecal occult She will monitor stool - depending on above results and her symptoms will consider referral to GI

## 2021-07-17 NOTE — Patient Instructions (Addendum)
° ° ° °  Blood work was ordered.      Medications changes include :   none     Monitor your stools and if the black stools continue please let us know.

## 2021-07-17 NOTE — Assessment & Plan Note (Signed)
Chronic BP well controlled and not low Continue amlodipine 10 mg daily, atenolol 25 mg daily

## 2021-07-18 ENCOUNTER — Telehealth: Payer: Self-pay

## 2021-07-18 NOTE — Telephone Encounter (Signed)
Pt grandson threw the Stool kit away and want to know if she can come by and pick up another one.  Please advise [phone]

## 2021-07-19 NOTE — Telephone Encounter (Signed)
Patient came by today and received new kit

## 2021-07-21 NOTE — Addendum Note (Signed)
Addended by: Binnie Rail on: 07/21/2021 10:54 AM   Modules accepted: Orders

## 2021-07-23 ENCOUNTER — Other Ambulatory Visit: Payer: Self-pay | Admitting: Internal Medicine

## 2021-07-23 ENCOUNTER — Other Ambulatory Visit (INDEPENDENT_AMBULATORY_CARE_PROVIDER_SITE_OTHER): Payer: Medicare Other

## 2021-07-23 DIAGNOSIS — K921 Melena: Secondary | ICD-10-CM

## 2021-07-23 LAB — HEMOCCULT SLIDES (X 3 CARDS)
Fecal Occult Blood: NEGATIVE
OCCULT 1: NEGATIVE
OCCULT 2: NEGATIVE
OCCULT 3: NEGATIVE
OCCULT 4: NEGATIVE
OCCULT 5: NEGATIVE

## 2021-07-30 DIAGNOSIS — H04213 Epiphora due to excess lacrimation, bilateral lacrimal glands: Secondary | ICD-10-CM | POA: Diagnosis not present

## 2021-07-30 DIAGNOSIS — H04412 Chronic dacryocystitis of left lacrimal passage: Secondary | ICD-10-CM | POA: Diagnosis not present

## 2021-07-30 DIAGNOSIS — H04123 Dry eye syndrome of bilateral lacrimal glands: Secondary | ICD-10-CM | POA: Diagnosis not present

## 2021-08-05 ENCOUNTER — Telehealth: Payer: Self-pay | Admitting: Internal Medicine

## 2021-08-05 ENCOUNTER — Telehealth: Payer: Self-pay | Admitting: Gastroenterology

## 2021-08-05 NOTE — Telephone Encounter (Signed)
Returned call to patient. Her appt has been moved to Tuesday, 08/06/21 at 1:30 pm with Carl Best, NP. I provided pt with the office number and address. Pt verbalized understanding and had no concerns at the end of the call.

## 2021-08-05 NOTE — Telephone Encounter (Signed)
Patient called this morning.  Her PCP referred her here because she's having black stools and unspecified anemia.  She has an appointment 3/20, but would like to be seen sooner rather than later.  The apps don't have anything any sooner either.  Please call patient and advise.  Thank you.

## 2021-08-05 NOTE — Telephone Encounter (Signed)
Pt checking status of 07-23-2021 lab result; pt states black stool has returned  informed pt of provider's 07-23-2021 result notes and recommendations, pt verbalized understanding, call transferred to [facility]

## 2021-08-06 ENCOUNTER — Ambulatory Visit: Payer: Medicare Other | Admitting: Nurse Practitioner

## 2021-08-06 ENCOUNTER — Other Ambulatory Visit (INDEPENDENT_AMBULATORY_CARE_PROVIDER_SITE_OTHER): Payer: Medicare Other

## 2021-08-06 ENCOUNTER — Encounter: Payer: Self-pay | Admitting: Nurse Practitioner

## 2021-08-06 ENCOUNTER — Encounter: Payer: Self-pay | Admitting: Gastroenterology

## 2021-08-06 VITALS — BP 124/60 | HR 60 | Ht 60.5 in | Wt 145.4 lb

## 2021-08-06 DIAGNOSIS — D509 Iron deficiency anemia, unspecified: Secondary | ICD-10-CM | POA: Diagnosis not present

## 2021-08-06 DIAGNOSIS — E7849 Other hyperlipidemia: Secondary | ICD-10-CM

## 2021-08-06 DIAGNOSIS — R1013 Epigastric pain: Secondary | ICD-10-CM | POA: Diagnosis not present

## 2021-08-06 DIAGNOSIS — R7303 Prediabetes: Secondary | ICD-10-CM | POA: Diagnosis not present

## 2021-08-06 DIAGNOSIS — D696 Thrombocytopenia, unspecified: Secondary | ICD-10-CM | POA: Diagnosis not present

## 2021-08-06 DIAGNOSIS — Z0000 Encounter for general adult medical examination without abnormal findings: Secondary | ICD-10-CM | POA: Diagnosis not present

## 2021-08-06 DIAGNOSIS — I10 Essential (primary) hypertension: Secondary | ICD-10-CM | POA: Diagnosis not present

## 2021-08-06 LAB — CBC WITH DIFFERENTIAL/PLATELET
Basophils Absolute: 0.1 10*3/uL (ref 0.0–0.1)
Basophils Relative: 1.3 % (ref 0.0–3.0)
Eosinophils Absolute: 0.1 10*3/uL (ref 0.0–0.7)
Eosinophils Relative: 1.1 % (ref 0.0–5.0)
HCT: 36.7 % (ref 36.0–46.0)
Hemoglobin: 11.5 g/dL — ABNORMAL LOW (ref 12.0–15.0)
Lymphocytes Relative: 17.8 % (ref 12.0–46.0)
Lymphs Abs: 1.3 10*3/uL (ref 0.7–4.0)
MCHC: 31.4 g/dL (ref 30.0–36.0)
MCV: 74.2 fl — ABNORMAL LOW (ref 78.0–100.0)
Monocytes Absolute: 0.8 10*3/uL (ref 0.1–1.0)
Monocytes Relative: 10.6 % (ref 3.0–12.0)
Neutro Abs: 5 10*3/uL (ref 1.4–7.7)
Neutrophils Relative %: 69.2 % (ref 43.0–77.0)
Platelets: 241 10*3/uL (ref 150.0–400.0)
RBC: 4.95 Mil/uL (ref 3.87–5.11)
RDW: 18.4 % — ABNORMAL HIGH (ref 11.5–15.5)
WBC: 7.2 10*3/uL (ref 4.0–10.5)

## 2021-08-06 LAB — COMPREHENSIVE METABOLIC PANEL
ALT: 10 U/L (ref 0–35)
AST: 16 U/L (ref 0–37)
Albumin: 3.9 g/dL (ref 3.5–5.2)
Alkaline Phosphatase: 89 U/L (ref 39–117)
BUN: 12 mg/dL (ref 6–23)
CO2: 27 mEq/L (ref 19–32)
Calcium: 10 mg/dL (ref 8.4–10.5)
Chloride: 107 mEq/L (ref 96–112)
Creatinine, Ser: 0.91 mg/dL (ref 0.40–1.20)
GFR: 63.12 mL/min (ref >60.00)
Glucose, Bld: 100 mg/dL — ABNORMAL HIGH (ref 70–99)
Potassium: 4.2 mEq/L (ref 3.5–5.1)
Sodium: 142 mEq/L (ref 135–145)
Total Bilirubin: 0.6 mg/dL (ref 0.2–1.2)
Total Protein: 7 g/dL (ref 6.0–8.3)

## 2021-08-06 LAB — LIPID PANEL
Cholesterol: 156 mg/dL (ref 0–200)
HDL: 58.6 mg/dL (ref >39.00)
LDL Cholesterol: 76 mg/dL (ref 0–99)
NonHDL: 97.71
Total CHOL/HDL Ratio: 3
Triglycerides: 108 mg/dL (ref 0.0–149.0)
VLDL: 21.6 mg/dL (ref 0.0–40.0)

## 2021-08-06 LAB — TSH: TSH: 1.79 u[IU]/mL (ref 0.35–5.50)

## 2021-08-06 LAB — HEMOGLOBIN A1C: Hgb A1c MFr Bld: 5.6 % (ref 4.6–6.5)

## 2021-08-06 MED ORDER — OMEPRAZOLE 20 MG PO CPDR: 20.0000 mg | DELAYED_RELEASE_CAPSULE | Freq: Every day | ORAL | 1 refills | Status: DC

## 2021-08-06 NOTE — Progress Notes (Signed)
08/06/2021 Brooke [name] [id number] 12-06-1950   Chief Complaint: Black stools  History of Present Illness: Brooke [name] is a 71 year old female with a past medical history of hypertension, hypercholesterolemia and chronic anemia.   She presents to our office today s referred by Dr. Billey Gosling for further evaluation regarding black stools. She started passing black stools daily 3 weeks ago. She contacted Dr. Quay Burow 07/16/2021 and labs and stool cards were ordered. She was seen in office by Dr. Quay Burow on 07/17/2021, at that time she reported passing loose black stools.  No Pepto-Bismol or oral iron use.  Labs 07/17/2021 showed chronic microcytic anemia with a Hg level of 11.0 ( Hg 11.9 on 06/12/2021). MCV 74.0. Thrombocytopenia with platelet count 130 (PLT 230 on 06/12/2021).  Iron 46.  Iron saturation ratios 19.6.  TIBC 235.2.  BUN 11.  Creatinine 0.91.  Normal LFTs.  Stool cards were guaiac negative.  She was advised to follow-up in our office today for further GI evaluation.  She denies passing any loose black stools as noted above and stated she is passing solid black stools most days.  A few days she passed a solid stool which was black and brown in color.  Yesterday, her stool was completely brown in color.  No BM yet today.  No bright red rectal bleeding.  She complains of central upper abdominal pain which comes and goes over the past few months for which she takes Alka-Seltzer.  No specific food triggers.  Eating does not trigger or worsen her abdominal pain.  No dysphagia or heartburn.  No nausea or vomiting.  She takes ASA 81 mg daily.  No other NSAID use.  No alcohol use.  Her husband died last year and she rarely cooks a meal since then.  She eats small snacks sized meals twice daily.  She has not eaten breakfast or lunch thus far today.  She has lost 7 pounds over the past 4 to 6 weeks.  No fever, sweats or chills.  She underwent a colonoscopy by Dr. Loletha Carrow 10/10/2019 which identified 1  diminutive tubular adenomatous polyp which was removed from the transverse colon.  No known family history of esophageal, gastric or colon cancer.  No family history of thalassemia.   CBC Latest Ref Rng & Units 07/17/2021 06/12/2021 05/29/2021  WBC 4.0 - 10.5 K/uL 8.9 8.9 8.8  Hemoglobin 12.0 - 15.0 g/dL 11.0(L) 11.9(L) 11.4(L)  Hematocrit 36.0 - 46.0 % 35.0(L) 37.4 35.9(L)  Platelets 150.0 - 400.0 K/uL 130.0(L) 230.0 187.0     Colonoscopy by Dr. Loletha Carrow 10/10/2019: - One diminutive polyp in the proximal transverse colon, removed with a cold snare. Resected and retrieved. - The examination was otherwise normal. -No further colonoscopies recommended due to age - TUBULAR ADENOMA - NEGATIVE FOR HIGH-GRADE DYSPLASIA OR MALIGNANCY  Past Medical History:  Diagnosis Date   FH: CAD (coronary artery disease)    High cholesterol    Hypertension    Memory loss    Obesity    Past Surgical History:  Procedure Laterality Date   CARPAL TUNNEL RELEASE     bilateral   HIP SURGERY     Current Outpatient Medications on File Prior to Visit  Medication Sig Dispense Refill   amLODipine (NORVASC) 10 MG tablet Take 1 tablet by mouth once daily 90 tablet 0   aspirin 81 MG chewable tablet Chew 81 mg by mouth daily.     atenolol (TENORMIN) 25 MG tablet Take 1 tablet by  mouth once daily 90 tablet 0   atorvastatin (LIPITOR) 20 MG tablet Take 1 tablet by mouth once daily 90 tablet 0   Eszopiclone 3 MG TABS Take 1 tablet (3 mg total) by mouth at bedtime. Take immediately before bedtime 30 tablet 5   neomycin-polymyxin b-dexamethasone (MAXITROL) 3.5-10000-0.1 OINT      potassium chloride (KLOR-CON) 10 MEQ tablet TAKE 1 TABLET BY MOUTH THREE TIMES DAILY 270 tablet 0   prednisoLONE acetate (PRED FORTE) 1 % ophthalmic suspension 1 drop 4 (four) times daily.     sertraline (ZOLOFT) 100 MG tablet Take 1 tablet (100 mg total) by mouth daily. 90 tablet 1   [DISCONTINUED] potassium chloride (KLOR-CON M10) 10 MEQ tablet  Take 1 tablet (10 mEq total) by mouth three times daily. 90 tablet 0   No current facility-administered medications on file prior to visit.   Allergies  Allergen Reactions   Penicillins Hives   Current Medications, Allergies, Past Medical History, Past Surgical History, Family History and Social History were reviewed in [employer] record.  Review of Systems:   Constitutional: Negative for fever, sweats, chills or weight loss.  Respiratory: Negative for shortness of breath.   Cardiovascular: Negative for chest pain, palpitations and leg swelling.  Gastrointestinal: See HPI.  Musculoskeletal: Negative for back pain or muscle aches.  Neurological: Negative for dizziness, headaches or paresthesias.   Physical Exam: Ht 5' 0.5" (1.537 m)    Wt 145 lb 6 oz (65.9 kg)    BMI 27.92 kg/m  General: Pleasant 71 year old female in NAD. Head: Normocephalic and atraumatic. Eyes: No scleral icterus. Conjunctiva pink . Ears: Normal auditory acuity. Mouth: Dentition intact. No ulcers or lesions.  Lungs: Clear throughout to auscultation. Heart: Regular rate and rhythm, no murmur. Abdomen: Soft, nontender and nondistended. No masses or hepatomegaly. Normal bowel sounds x 4 quadrants.  Rectal: Deferred. Heme slides negative.  Musculoskeletal: Symmetrical with no gross deformities. Extremities: No edema. Neurological: Alert oriented x 4. No focal deficits.  Psychological: Alert and cooperative. Normal mood and affect  Assessment and Recommendations:  81) 71 year old female with black stools x 3 weeks, intermittent central upper abdominal pain with chronic microcytic anemia. Hg 11.0. MCV 74. Iron 46. TIBC low at 235.2. Stool cards guaiac negative. No Pepto bismol or oral iron use. On ASA 81mg  QD.  -CBC, iron, iron saturation, TIBC, Ferritin level, TTG and IgA level -Omeprazole 20mg  po QD -EGD to rule out PUD/UGI malignancy   2) Intermittent central upper abdominal pain  -See  plan in # 1 -Abdominal sonogram to evaluate the gallbladder  3) Thrombocytopenia, PLT 130.  No history of cirrhosis.  Normal LFTs. -Repeat CBC as ordered above -Abdominal sonogram as ordered above to also evaluate the liver and spleen   4) Suspect depression component, grieving the loss of her husband   5) Weight loss, secondary to her GI symptoms, sporadic eating pattern +/- depression component -Consider chest/abd/pelvic CT if weight loss persists

## 2021-08-06 NOTE — Patient Instructions (Signed)
You have been scheduled for a EGD. Please follow the written instructions given to you at your visit today. If you use inhalers (even only as needed), please bring them with you on the day of your procedure.  You will be contacted by [facility] (Your caller ID will indicate phone # [phone]) in the next 7 days to schedule your Abdominal Ultrasound. If you have not heard from them within 7 business days, please call [facility] at [phone] to follow up on the status of your appointment.    We have sent the following medication to your pharmacy for you to pick up at your convenience: Omeprazole 20 MG once a day.  Please proceed to the basement level for lab work before leaving today. Press "B" on the elevator. The lab is located at the first door on the left as you exit the elevator.  HEALTHCARE LAWS AND MY CHART RESULTS:   Due to recent changes in healthcare laws, you may see results of your imaging and/or laboratory studies on MyChart before I have had a chance to review them.  I understand that in some cases there may be results that are confusing or concerning to you. Please understand that not all results are received at the same time and often I may need to interpret multiple results in order to provide you with the best plan of care or course of treatment. Therefore, I ask that you please give me 48 hours to thoroughly review all your results before contacting my office for clarification.   Thank you for trusting me with your gastrointestinal care!    Noralyn Pick, CRNP    BMI:  If you are age 42 or older, your body mass index should be between 23-30. Your Body mass index is 27.92 kg/m. If this is out of the aforementioned range listed, please consider follow up with your Primary Care Provider.  If you are age 105 or younger, your body mass index should be between 19-25. Your Body mass index is 27.92 kg/m. If this is out of  the aformentioned range listed, please consider follow up with your Primary Care Provider.   MY CHART:  The [facility] providers would like to encourage you to use [facility] to communicate with providers for non-urgent requests or questions.  Due to long hold times on the telephone, sending your provider a message by [facility] may be a faster and more efficient way to get a response.  Please allow 48 business hours for a response.  Please remember that this is for non-urgent requests.

## 2021-08-06 NOTE — Progress Notes (Signed)
RADIOLOGY SCHEDULING REQUEST FOR complete abd [place] [facility] Scheduling via secure staff message.

## 2021-08-07 LAB — IRON,TIBC AND FERRITIN PANEL
%SAT: 13 % (calc) — ABNORMAL LOW (ref 16–45)
Ferritin: 268 ng/mL (ref 16–288)
Iron: 33 ug/dL — ABNORMAL LOW (ref 45–160)
TIBC: 256 mcg/dL (calc) (ref 250–450)

## 2021-08-07 LAB — TISSUE TRANSGLUTAMINASE ABS,IGG,IGA
(tTG) Ab, IgA: <1 U/mL
(tTG) Ab, IgG: 1 U/mL

## 2021-08-07 LAB — IGA: Immunoglobulin A: 241 mg/dL (ref 70–320)

## 2021-08-07 NOTE — Progress Notes (Signed)
____________________________________________________________  Attending physician addendum:  Thank you for sending this case to me. I have reviewed the entire note and agree with the plan.  It should be noted that Tambria has had an intermittent microcytic anemia dating back at least a decade.  In addition, even when she has had normal hemoglobin, there is still a microcytosis.  And while she does have a decreased iron saturation, her ferritin remains well into the normal range.  All that together suggests that she may have a hemoglobinopathy such as sickle cell trait contributing to this microcytosis and anemia.  The upper endoscopy is certainly reasonable to rule out an upper GI source of blood loss, especially with some reported black stool and it remaining unclear if that was truly blood, subsequent stool negative for occult blood. In the result note of this CBC, I will suggest primary care consideration of hemoglobin electrophoresis.  Wilfrid Lund, MD  ____________________________________________________________

## 2021-08-08 ENCOUNTER — Other Ambulatory Visit: Payer: Self-pay

## 2021-08-08 MED ORDER — FERROUS SULFATE 325 (65 FE) MG PO TABS: 325.0000 mg | ORAL_TABLET | Freq: Every day | ORAL | 1 refills | Status: DC

## 2021-08-09 ENCOUNTER — Ambulatory Visit (AMBULATORY_SURGERY_CENTER): Payer: Medicare Other | Admitting: Gastroenterology

## 2021-08-09 ENCOUNTER — Other Ambulatory Visit: Payer: Self-pay | Admitting: Gastroenterology

## 2021-08-09 ENCOUNTER — Other Ambulatory Visit: Payer: Self-pay

## 2021-08-09 ENCOUNTER — Encounter: Payer: Self-pay | Admitting: Gastroenterology

## 2021-08-09 VITALS — BP 106/64 | HR 52 | Temp 97.8°F | Resp 15 | Ht 60.0 in | Wt 145.0 lb

## 2021-08-09 DIAGNOSIS — D508 Other iron deficiency anemias: Secondary | ICD-10-CM | POA: Diagnosis not present

## 2021-08-09 DIAGNOSIS — B9681 Helicobacter pylori [H. pylori] as the cause of diseases classified elsewhere: Secondary | ICD-10-CM | POA: Diagnosis not present

## 2021-08-09 DIAGNOSIS — R1013 Epigastric pain: Secondary | ICD-10-CM | POA: Diagnosis not present

## 2021-08-09 DIAGNOSIS — K2950 Unspecified chronic gastritis without bleeding: Secondary | ICD-10-CM | POA: Diagnosis not present

## 2021-08-09 DIAGNOSIS — K529 Noninfective gastroenteritis and colitis, unspecified: Secondary | ICD-10-CM

## 2021-08-09 DIAGNOSIS — K2970 Gastritis, unspecified, without bleeding: Secondary | ICD-10-CM | POA: Diagnosis not present

## 2021-08-09 MED ORDER — SODIUM CHLORIDE 0.9 % IV SOLN: 500.0000 mL | Freq: Once | INTRAVENOUS | Status: DC

## 2021-08-09 MED ORDER — DICYCLOMINE HCL 10 MG PO CAPS: 10.0000 mg | ORAL_CAPSULE | Freq: Every day | ORAL | 0 refills | Status: DC

## 2021-08-09 NOTE — Progress Notes (Signed)
Called to room to assist during endoscopic procedure.  Patient ID and intended procedure confirmed with present staff. Received instructions for my participation in the procedure from the performing physician.  

## 2021-08-09 NOTE — Progress Notes (Signed)
Report given to PACU, vss 

## 2021-08-09 NOTE — Progress Notes (Signed)
VS-CW  Pt's states no medical or surgical changes since previsit or office visit.  

## 2021-08-09 NOTE — Progress Notes (Signed)
No changes to clinical history since GI office visit on 08/06/21. ? ?The patient is appropriate for an endoscopic procedure in the ambulatory setting. ? ?- Wilfrid Lund, MD ? ? ? ?

## 2021-08-09 NOTE — Patient Instructions (Signed)
Impression/Recommendations: ? ?Resume previous diet. ?Continue present medications. ?Await pathology results. ? ?YOU HAD AN ENDOSCOPIC PROCEDURE TODAY AT [facility]:   Refer to the procedure report that was given to you for any specific questions about what was found during the examination.  If the procedure report does not answer your questions, please call your gastroenterologist to clarify.  If you requested that your care partner not be given the details of your procedure findings, then the procedure report has been included in a sealed envelope for you to review at your convenience later. ? ?YOU SHOULD EXPECT: Some feelings of bloating in the abdomen. Passage of more gas than usual.  Walking can help get rid of the air that was put into your GI tract during the procedure and reduce the bloating. If you had a lower endoscopy (such as a colonoscopy or flexible sigmoidoscopy) you may notice spotting of blood in your stool or on the toilet paper. If you underwent a bowel prep for your procedure, you may not have a normal bowel movement for a few days. ? ?Please Note:  You might notice some irritation and congestion in your nose or some drainage.  This is from the oxygen used during your procedure.  There is no need for concern and it should clear up in a day or so. ? ?SYMPTOMS TO REPORT IMMEDIATELY: ? ?Following upper endoscopy (EGD) ? Vomiting of blood or coffee ground material ? New chest pain or pain under the shoulder blades ? Painful or persistently difficult swallowing ? New shortness of breath ? Fever of 100?F or higher ? Black, tarry-looking stools ? ?For urgent or emergent issues, a gastroenterologist can be reached at any hour by calling [phone]. ?Do not use MyChart messaging for urgent concerns.  ? ? ?DIET:  We do recommend a small meal at first, but then you may proceed to your regular diet.  Drink plenty of fluids but you should avoid alcoholic beverages for 24  hours. ? ?ACTIVITY:  You should plan to take it easy for the rest of today and you should NOT DRIVE or use heavy machinery until tomorrow (because of the sedation medicines used during the test).   ? ?FOLLOW UP: ?Our staff will call the number listed on your records 48-72 hours following your procedure to check on you and address any questions or concerns that you may have regarding the information given to you following your procedure. If we do not reach you, we will leave a message.  We will attempt to reach you two times.  During this call, we will ask if you have developed any symptoms of COVID 19. If you develop any symptoms (ie: fever, flu-like symptoms, shortness of breath, cough etc.) before then, please call [phone].  If you test positive for Covid 19 in the 2 weeks post procedure, please call and report this information to [place].   ? ?If any biopsies were taken you will be contacted by phone or by letter within the next 1-3 weeks.  Please call us at [phone] if you have not heard about the biopsies in 3 weeks.  ? ? ?SIGNATURES/CONFIDENTIALITY: ?You and/or your care partner have signed paperwork which will be entered into your electronic medical record.  These signatures attest to the fact that that the information above on your After Visit Summary has been reviewed and is understood.  Full responsibility of the confidentiality of this discharge information lies with you and/or your care-partner.  ?

## 2021-08-09 NOTE — Progress Notes (Signed)
1035 Robinul 0.1 mg IV given due large amount of secretions upon assessment.  MD made aware, vss 

## 2021-08-09 NOTE — Op Note (Signed)
[facility] ?Patient Name: Brooke [name] ?Procedure Date: 08/09/2021 10:34 AM ?MRN: [id number] ?Endoscopist: Estill Cotta. Loletha Carrow , MD ?Age: 71 ?Referring MD:  ?Date of Birth: 04/23/1951 ?Gender: Female ?Account #: [id number] ?Procedure:                Upper GI endoscopy ?Indications:              Iron deficiency anemia (low iron and saturation,  ?                          normal ferritin, Heme negative. long-standing  ?                          microcytosis) ?                          clinical details in 08/06/21 office note ?Medicines:                Monitored Anesthesia Care ?Procedure:                Pre-Anesthesia Assessment: ?                          - Prior to the procedure, a History and Physical  ?                          was performed, and patient medications and  ?                          allergies were reviewed. The patient's tolerance of  ?                          previous anesthesia was also reviewed. The risks  ?                          and benefits of the procedure and the sedation  ?                          options and risks were discussed with the patient.  ?                          All questions were answered, and informed consent  ?                          was obtained. Prior Anticoagulants: The patient has  ?                          taken no previous anticoagulant or antiplatelet  ?                          agents. ASA Grade Assessment: II - A patient with  ?                          mild systemic disease. After reviewing the risks  ?  and benefits, the patient was deemed in  ?                          satisfactory condition to undergo the procedure. ?                          After obtaining informed consent, the endoscope was  ?                          passed under direct vision. Throughout the  ?                          procedure, the patient's blood pressure, pulse, and  ?                          oxygen saturations were monitored continuously. The  ?                           GIF [id number] #[id number] was introduced through the  ?                          mouth, and advanced to the second part of duodenum.  ?                          The upper GI endoscopy was accomplished without  ?                          difficulty. The patient tolerated the procedure  ?                          well. ?Scope In: ?Scope Out: ?Findings:                 The esophagus was normal. ?                          Diffuse atrophic mucosa was found in the entire  ?                          examined stomach. Multiple biopsies were obtained  ?                          on the greater curvature of the gastric body, on  ?                          the lesser curvature of the gastric body, on the  ?                          greater curvature of the gastric antrum and on the  ?                          lesser curvature of the gastric antrum with cold  ?  forceps for histology. ?                          The exam of the stomach was otherwise normal.  ?                          (examined under WL and NBI) ?                          The cardia and gastric fundus were normal on  ?                          retroflexion. ?                          Normal mucosa was found in the entire duodenum.  ?                          Biopsies for histology were taken with a cold  ?                          forceps for evaluation of celiac disease. ?Complications:            No immediate complications. ?Estimated Blood Loss:     Estimated blood loss was minimal. ?Impression:               - Normal esophagus. ?                          - Gastric mucosal atrophy. ?                          - Normal mucosa was found in the entire examined  ?                          duodenum. Biopsied. ?                          - Multiple biopsies were obtained on the greater  ?                          curvature of the gastric body, on the lesser  ?                          curvature of the gastric body, on the greater   ?                          curvature of the gastric antrum and on the lesser  ?                          curvature of the gastric antrum. ?Recommendation:           - Patient has a contact number available for  ?                          emergencies. The signs and symptoms of potential  ?  delayed complications were discussed with the  ?                          patient. Return to normal activities tomorrow.  ?                          Written discharge instructions were provided to the  ?                          patient. ?                          - Resume previous diet. ?                          - Continue present medications. ?                          - Await pathology results. ?                          - separate lab result note copied to PCP re:  ?                          question of underlying hemoglobinopathy  ?                          contributing to chronic microcytosis ?Gerard Cantara L. Loletha Carrow, MD ?08/09/2021 10:52:01 AM ?This report has been signed electronically. ?

## 2021-08-13 ENCOUNTER — Ambulatory Visit (HOSPITAL_COMMUNITY)
Admission: RE | Admit: 2021-08-13 | Discharge: 2021-08-13 | Disposition: A | Discharge status: Home / Self Care | Payer: Medicare Other | Source: Ambulatory Visit | Attending: Nurse Practitioner | Admitting: Nurse Practitioner

## 2021-08-13 ENCOUNTER — Other Ambulatory Visit: Payer: Self-pay

## 2021-08-13 ENCOUNTER — Telehealth: Payer: Self-pay | Admitting: *Deleted

## 2021-08-13 DIAGNOSIS — N281 Cyst of kidney, acquired: Secondary | ICD-10-CM | POA: Diagnosis not present

## 2021-08-13 DIAGNOSIS — D696 Thrombocytopenia, unspecified: Secondary | ICD-10-CM | POA: Insufficient documentation

## 2021-08-13 DIAGNOSIS — D509 Iron deficiency anemia, unspecified: Secondary | ICD-10-CM | POA: Diagnosis not present

## 2021-08-13 DIAGNOSIS — R1013 Epigastric pain: Secondary | ICD-10-CM | POA: Diagnosis not present

## 2021-08-13 DIAGNOSIS — K828 Other specified diseases of gallbladder: Secondary | ICD-10-CM | POA: Diagnosis not present

## 2021-08-13 NOTE — Telephone Encounter (Signed)
?  Follow up Call- ? ?Call back number 08/09/2021 10/10/2019  ?Post procedure Call Back phone  # [phone]  ?Permission to leave phone message Yes Yes  ?Some recent data might be hidden  ?  ? ?Patient questions: ? ?Do you have a fever, pain , or abdominal swelling? No. ?Pain Score  0 * ? ?Have you tolerated food without any problems? Yes.   ? ?Have you been able to return to your normal activities? Yes.   ? ?Do you have any questions about your discharge instructions: ?Diet   No. ?Medications  No. ?Follow up visit  No. ? ?Do you have questions or concerns about your Care? No. ? ?Actions: ?* If pain score is 4 or above: ?No action needed, pain <4. ? ? ?

## 2021-08-15 ENCOUNTER — Telehealth: Payer: Self-pay | Admitting: *Deleted

## 2021-08-15 NOTE — Telephone Encounter (Signed)
?  Follow up Call- ? ?Call back number 08/09/2021 10/10/2019  ?Post procedure Call Back phone  # [phone]  ?Permission to leave phone message Yes Yes  ?Some recent data might be hidden  ?  ? ?Patient questions: ? ?Do you have a fever, pain , or abdominal swelling? No. ?Pain Score  0 * ? ?Have you tolerated food without any problems? Yes.   ? ?Have you been able to return to your normal activities? Yes.   ? ?Do you have any questions about your discharge instructions: ?Diet   No. ?Medications  No. ?Follow up visit  No. ? ?Do you have questions or concerns about your Care? No. ? ?Actions: ?* If pain score is 4 or above: ?No action needed, pain <4. ? ?Have you developed a fever since your procedure? no ? ?2.   Have you had an respiratory symptoms (SOB or cough) since your procedure? no ? ?3.   Have you tested positive for COVID 19 since your procedure no ? ?4.   Have you had any family members/close contacts diagnosed with the COVID 19 since your procedure?  no ? ? ?If yes to any of these questions please route to Joylene John, RN and Joella Prince, RN  ? ? ?

## 2021-08-16 ENCOUNTER — Other Ambulatory Visit: Payer: Self-pay

## 2021-08-16 MED ORDER — METRONIDAZOLE 500 MG PO TABS
500.0000 mg | ORAL_TABLET | Freq: Three times a day (TID) | ORAL | 0 refills | Status: AC
Start: 2021-08-16 — End: 2021-08-30

## 2021-08-16 MED ORDER — BISMUTH SUBSALICYLATE 262 MG PO TABS
524.0000 mg | ORAL_TABLET | Freq: Four times a day (QID) | ORAL | 0 refills | Status: AC
Start: 2021-08-16 — End: 2021-08-30

## 2021-08-16 MED ORDER — DOXYCYCLINE HYCLATE 100 MG PO CAPS
100.0000 mg | ORAL_CAPSULE | Freq: Two times a day (BID) | ORAL | 0 refills | Status: AC
Start: 2021-08-16 — End: 2021-08-30

## 2021-08-20 ENCOUNTER — Ambulatory Visit (INDEPENDENT_AMBULATORY_CARE_PROVIDER_SITE_OTHER): Payer: Medicare Other

## 2021-08-20 DIAGNOSIS — E7849 Other hyperlipidemia: Secondary | ICD-10-CM

## 2021-08-20 DIAGNOSIS — D649 Anemia, unspecified: Secondary | ICD-10-CM

## 2021-08-20 DIAGNOSIS — I10 Essential (primary) hypertension: Secondary | ICD-10-CM

## 2021-08-20 DIAGNOSIS — G479 Sleep disorder, unspecified: Secondary | ICD-10-CM

## 2021-08-20 NOTE — Patient Instructions (Signed)
Visit Information ? ?Following are the goals we discussed today:  ? ?Manage My Medicine  ? ?Timeframe:  Long-Range Goal ?Priority:  Medium ?Start Date:       11/15/20                      ?Expected End Date:   05/17/22                   ? ?Follow Up Date September 2023 ?  ?- call for medicine refill 2 or 3 days before it runs out ?- call if I am sick and can't take my medicine ?- keep a list of all the medicines I take; vitamins and herbals too ?- use a pillbox to sort medicine  ?  ?Why is this important?   ?These steps will help you keep on track with your medicines. ? ?Plan: Telephone follow up appointment with care management team member scheduled for:  6 months ?The patient has been provided with contact information for the care management team and has been advised to call with any health related questions or concerns.  ? ?Tomasa Blase, PharmD ?Clinical Pharmacist, [place]  ? ?Please call the care guide team at [phone] if you need to cancel or reschedule your appointment.  ? ?Patient verbalizes understanding of instructions and care plan provided today and agrees to view in [place]. Active MyChart status confirmed with patient.   ? ?

## 2021-08-20 NOTE — Progress Notes (Signed)
? ?Chronic Care Management ?Pharmacy Note ? ?08/20/2021 ?Name:  Brooke [name] MRN:  [id number] DOB:  Sep 26, 1950 ? ?Summary: ?-Since last appointment patient reports that she has been dealing with stomach pains / black stools- following with GI - notes that stomach pains have improved / only having small amount of black stool on occasion (currently on 14 day course of metronidazole, doxycycline, and pepto bismol) ?-Had not yet started ferrous sulfate ordered by GI earlier this month - was unaware it had been rx'd for her  ?-BP has been controlled at most recent office visits, patient denies any issues  ?-Notes that she feels sleep has improved, using lunesta prn with success  ?-Reports compliance to current medications, denies any issues with current medications  ? ?Recommendations/Changes made from today's visit: ?-Recommending for patient to start ferrous sulfate $RemoveBeforeD'325mg'GmwoqMEcINesuH$  daily as directed, patient to continue follow up with GI and PCP regarding stomach pains/ black stools  ? ?Subjective: ?Brooke [name] is an 71 y.o. year old female who is a primary patient of Burns, Claudina Lick, MD.  The CCM team was consulted for assistance with disease management and care coordination needs.   ? ?Engaged with patient by telephone for follow up visit in response to provider referral for pharmacy case management and/or care coordination services.  ? ?Consent to Services:  ?The patient was given information about Chronic Care Management services, agreed to services, and gave verbal consent prior to initiation of services.  Please see initial visit note for detailed documentation.  ? ?Patient Care Team: ?Binnie Rail, MD as PCP - General (Internal Medicine) ?Tomasa Blase, [facility] (Pharmacist) ? ?Recent office visits: ?07/17/2021 - Dr. Quay Burow - black stools, consider referral to gastro ?06/28/2021 - Dr. Quay Burow - tremor - no changes to medications - consider referral to neurology should tremor worsen   ?06/12/2021 - Dr. Quay Burow - diarrhea /  black stool - recommended pepto-bismol prn  ?05/29/2021 - Dr. Quay Burow - sleep difficulties - continue current medications - follow up in 6 months  ? ?Recent consult visits: ?08/09/2021 - Upper GI endoscopy completed  ?08/06/2021 -Ria Clock  - evaluation of black stools - EGD / abdominal ultrasound ordered - no changes to medications  ? ?Hospital visits: ?06/14/2021 - Urgent Care - constipation - start miralax daily - stop taking pepto and imodium  ? ? ?Objective: ? ?Lab Results  ?Component Value Date  ? CREATININE 0.91 08/06/2021  ? BUN 12 08/06/2021  ? GFR 63.12 08/06/2021  ? GFRNONAA 46 (L) 05/01/2021  ? GFRAA >90 04/27/2012  ? NA 142 08/06/2021  ? K 4.2 08/06/2021  ? CALCIUM 10.0 08/06/2021  ? CO2 27 08/06/2021  ? GLUCOSE 100 (H) 08/06/2021  ? ? ?Lab Results  ?Component Value Date/Time  ? HGBA1C 5.6 08/06/2021 02:23 PM  ? HGBA1C 6.0 05/29/2021 09:54 AM  ? GFR 63.12 08/06/2021 02:23 PM  ? GFR 63.14 07/17/2021 03:43 PM  ? MICROALBUR 24.2 (H) 03/19/2009 09:36 AM  ?  ?Last diabetic Eye exam:  ?Lab Results  ?Component Value Date/Time  ? HMDIABEYEEXA No Retinopathy 01/15/2018 12:00 AM  ?  ?Last diabetic Foot exam: No results found for: HMDIABFOOTEX  ? ?Lab Results  ?Component Value Date  ? CHOL 156 08/06/2021  ? HDL 58.60 08/06/2021  ? [place] 76 08/06/2021  ? LDLDIRECT 162.0 08/05/2012  ? TRIG 108.0 08/06/2021  ? CHOLHDL 3 08/06/2021  ? ? ?Hepatic Function Latest Ref Rng & Units 08/06/2021 07/17/2021 06/12/2021  ?Total Protein 6.0 -  8.3 g/dL 7.0 6.4 7.1  ?Albumin 3.5 - 5.2 g/dL 3.9 3.5 4.0  ?AST 0 - 37 U/L $Remo'16 20 16  'ZvJzA$ ?ALT 0 - 35 U/L $Remo'10 23 16  'BmrnM$ ?Alk Phosphatase 39 - 117 U/L 89 90 79  ?Total Bilirubin 0.2 - 1.2 mg/dL 0.6 0.7 0.6  ?Bilirubin, Direct 0.0 - 0.3 mg/dL - - -  ? ? ?Lab Results  ?Component Value Date/Time  ? TSH 1.79 08/06/2021 02:23 PM  ? TSH 2.03 07/12/2019 02:29 PM  ? ? ?CBC Latest Ref Rng & Units 08/06/2021 07/17/2021 06/12/2021  ?WBC 4.0 - 10.5 K/uL 7.2 8.9 8.9  ?Hemoglobin 12.0 - 15.0 g/dL 11.5(L)  11.0(L) 11.9(L)  ?Hematocrit 36.0 - 46.0 % 36.7 35.0(L) 37.4  ?Platelets 150.0 - 400.0 K/uL 241.0 130.0(L) 230.0  ? ? ?No results found for: VD25OH ? ?Clinical ASCVD: No  ?The 10-year ASCVD risk score (Arnett DK, et al., 2019) is: 7.8% ?  Values used to calculate the score: ?    Age: 40 years ?    Sex: Female ?    Is Non-Hispanic African American: Yes ?    Diabetic: No ?    Tobacco smoker: No ?    Systolic Blood Pressure: 952 mmHg ?    Is BP treated: Yes ?    HDL Cholesterol: 58.6 mg/dL ?    Total Cholesterol: 156 mg/dL   ? ?Depression screen [facility] 2/9 07/22/2021 05/29/2021 09/04/2020  ?Decreased Interest 1 0 3  ?Down, Depressed, Hopeless 1 0 3  ?PHQ - 2 Score 2 0 6  ?Altered sleeping $RemoveBeforeDEI'1 2 3  'qwtxVZehstAtVrDV$ ?Tired, decreased energy 0 0 3  ?Change in appetite 0 0 3  ?Feeling bad or failure about yourself  0 0 2  ?Trouble concentrating 0 0 3  ?Moving slowly or fidgety/restless 0 0 3  ?Suicidal thoughts 0 0 0  ?PHQ-9 Score $RemoveBefo'3 2 23  'uXUCzJsGeRq$ ?Difficult doing work/chores Somewhat difficult Not difficult at all Very difficult  ?  ? ?Social History  ? ?Tobacco Use  ?Smoking Status Never  ?Smokeless Tobacco Never  ? ?BP Readings from Last 3 Encounters:  ?08/09/21 106/64  ?08/06/21 124/60  ?07/17/21 126/80  ? ?Pulse Readings from Last 3 Encounters:  ?08/09/21 (!) 52  ?08/06/21 60  ?07/17/21 70  ? ?Wt Readings from Last 3 Encounters:  ?08/09/21 145 lb (65.8 kg)  ?08/06/21 145 lb 6 oz (65.9 kg)  ?07/17/21 148 lb 12.8 oz (67.5 kg)  ? ?BMI Readings from Last 3 Encounters:  ?08/09/21 28.32 kg/m?  ?08/06/21 27.92 kg/m?  ?07/17/21 28.49 kg/m?  ? ? ?Assessment/Interventions: Review of patient past medical history, allergies, medications, health status, including review of consultants reports, laboratory and other test data, was performed as part of comprehensive evaluation and provision of chronic care management services.  ? ?SDOH:  (Social Determinants of Health) assessments and interventions performed: Yes ? ?SDOH Screenings  ? ?Alcohol Screen: Not on file   ?Depression (PHQ2-9): Low Risk   ? PHQ-2 Score: 3  ?Financial Resource Strain: Not on file  ?Food Insecurity: Not on file  ?Housing: Not on file  ?Physical Activity: Not on file  ?Social Connections: Not on file  ?Stress: Not on file  ?Tobacco Use: Low Risk   ? Smoking Tobacco Use: Never  ? Smokeless Tobacco Use: Never  ? Passive Exposure: Not on file  ?Transportation Needs: Not on file  ? ? ?[place] ? ?Allergies  ?Allergen Reactions  ? Penicillins Hives  ? ? ?Medications Reviewed Today   ? ?  Reviewed by Tomasa Blase, [facility] (Pharmacist) on 08/20/21 at 1431  Med List Status: <None>  ? ?Medication Order Taking? Sig Documenting Provider Last Dose Status Informant  ?amLODipine (NORVASC) 10 MG tablet [id number] Yes Take 1 tablet by mouth once daily Burns, Claudina Lick, MD Taking Active   ?aspirin 81 MG chewable tablet [id number] Yes Chew 81 mg by mouth daily. [provider] Taking Active Self  ?atenolol (TENORMIN) 25 MG tablet [id number] Yes Take 1 tablet by mouth once daily Burns, Claudina Lick, MD Taking Active   ?atorvastatin (LIPITOR) 20 MG tablet [id number] Yes Take 1 tablet by mouth once daily Burns, Claudina Lick, MD Taking Active   ?Bismuth Subsalicylate 426 MG TABS [id number] Yes Take 2 tablets (524 mg total) by mouth in the morning, at noon, in the evening, and at bedtime for 14 days. Doran Stabler, MD Taking Active   ?dicyclomine (BENTYL) 10 MG capsule [id number] Yes Take 1 capsule (10 mg total) by mouth daily with breakfast. Doran Stabler, MD Taking Active   ?doxycycline (VIBRAMYCIN) 100 MG capsule [id number] Yes Take 1 capsule (100 mg total) by mouth 2 (two) times daily for 14 days. Doran Stabler, MD Taking Active   ?Eszopiclone 3 MG TABS [id number] Yes Take 1 tablet (3 mg total) by mouth at bedtime. Take immediately before bedtime Burns, Claudina Lick, MD Taking Active   ?ferrous sulfate 325 (65 FE) MG tablet [id number] Yes Take 325 mg by mouth daily. [provider]  Active   ?metroNIDAZOLE  (FLAGYL) 500 MG tablet [id number] Yes Take 1 tablet (500 mg total) by mouth 3 (three) times daily for 14 days. Doran Stabler, MD Taking Active   ?omeprazole (PRILOSEC) 20 MG capsule [id number] No Take 1 capsule

## 2021-08-26 ENCOUNTER — Ambulatory Visit: Payer: Medicare Other | Admitting: Gastroenterology

## 2021-09-02 DIAGNOSIS — H2513 Age-related nuclear cataract, bilateral: Secondary | ICD-10-CM | POA: Diagnosis not present

## 2021-09-02 DIAGNOSIS — H40033 Anatomical narrow angle, bilateral: Secondary | ICD-10-CM | POA: Diagnosis not present

## 2021-09-06 DIAGNOSIS — I10 Essential (primary) hypertension: Secondary | ICD-10-CM | POA: Diagnosis not present

## 2021-09-06 DIAGNOSIS — D649 Anemia, unspecified: Secondary | ICD-10-CM | POA: Diagnosis not present

## 2021-09-06 DIAGNOSIS — E7849 Other hyperlipidemia: Secondary | ICD-10-CM | POA: Diagnosis not present

## 2021-09-14 DIAGNOSIS — H00024 Hordeolum internum left upper eyelid: Secondary | ICD-10-CM | POA: Diagnosis not present

## 2021-09-16 DIAGNOSIS — H00024 Hordeolum internum left upper eyelid: Secondary | ICD-10-CM | POA: Diagnosis not present

## 2021-09-18 ENCOUNTER — Telehealth: Payer: Self-pay | Admitting: Internal Medicine

## 2021-09-18 MED ORDER — DICYCLOMINE HCL 10 MG PO CAPS: 10.0000 mg | ORAL_CAPSULE | Freq: Every day | ORAL | 1 refills | Status: DC

## 2021-09-18 NOTE — Telephone Encounter (Signed)
Bentyl renewed - she should f/u with GI if diarrhea is persisting ?

## 2021-09-18 NOTE — Telephone Encounter (Signed)
Pt requesting call back.  ? ?States she is having problems again with diarrhea and it has gotten worse. More watery for the last day.  ?

## 2021-09-19 NOTE — Telephone Encounter (Signed)
Spoke with patient today. 

## 2021-09-27 ENCOUNTER — Encounter: Payer: Self-pay | Admitting: Gastroenterology

## 2021-09-27 ENCOUNTER — Ambulatory Visit: Payer: Medicare Other | Admitting: Gastroenterology

## 2021-09-27 VITALS — BP 102/60 | HR 84 | Ht 61.0 in | Wt 139.2 lb

## 2021-09-27 DIAGNOSIS — A048 Other specified bacterial intestinal infections: Secondary | ICD-10-CM | POA: Diagnosis not present

## 2021-09-27 DIAGNOSIS — D509 Iron deficiency anemia, unspecified: Secondary | ICD-10-CM | POA: Diagnosis not present

## 2021-09-27 DIAGNOSIS — R194 Change in bowel habit: Secondary | ICD-10-CM

## 2021-09-27 NOTE — Progress Notes (Addendum)
? ? ? ?[facility] Progress Note ? ?Chief Complaint: Microcytic anemia, irregular bowel habits. ? ?Subjective  ?History: ? ?Brooke [name] follows up after her recent procedure. ? ?My addendum from her most recent office note with our NP: ?"It should be noted that Brooke [name] has had an intermittent microcytic anemia dating back at least a decade.  In addition, even when she has had normal hemoglobin, there is still a microcytosis.  And while she does have a decreased iron saturation, her ferritin remains well into the normal range.  All that together suggests that she may have a hemoglobinopathy such as sickle cell trait contributing to this microcytosis and anemia. ?  ?The upper endoscopy is certainly reasonable to rule out an upper GI source of blood loss, especially with some reported black stool and it remaining unclear if that was truly blood, subsequent stool negative for occult blood. ?In the result note of this CBC, I will suggest primary care consideration of hemoglobin electrophoresis." ? ?Last colonoscopy 2021. ?EGD 08/09/2021 with diffusely atrophic gastric mucosa, biopsies H. pylori positive and treated with bismuth based quadruple therapy.  Duodenal biopsies normal. ?  ?She is glad to report feeling well lately with no abdominal pain or loose stool.  She no longer has black stool ?Her appetite remains fair, and that has been the case since the loss of her husband Brooke [name] last year. ? ?ROS: ?Cardiovascular:  no chest pain ?Respiratory: no dyspnea ? ?The patient's Past Medical, Family and Social History were reviewed and are on file in the EMR. ? ?Objective: ? ?Med list reviewed ? ?Current Outpatient Medications:  ?  amLODipine (NORVASC) 10 MG tablet, Take 1 tablet by mouth once daily, Disp: 90 tablet, Rfl: 0 ?  aspirin 81 MG chewable tablet, Chew 81 mg by mouth daily., Disp: , Rfl:  ?  atenolol (TENORMIN) 25 MG tablet, Take 1 tablet by mouth once daily, Disp: 90 tablet, Rfl: 0 ?  atorvastatin (LIPITOR) 20 MG  tablet, Take 1 tablet by mouth once daily, Disp: 90 tablet, Rfl: 0 ?  dicyclomine (BENTYL) 10 MG capsule, Take 1 capsule (10 mg total) by mouth daily with breakfast., Disp: 30 capsule, Rfl: 1 ?  Eszopiclone 3 MG TABS, Take 1 tablet (3 mg total) by mouth at bedtime. Take immediately before bedtime, Disp: 30 tablet, Rfl: 5 ?  ferrous sulfate 325 (65 FE) MG tablet, Take 325 mg by mouth daily., Disp: , Rfl:  ?  omeprazole (PRILOSEC) 20 MG capsule, Take 1 capsule (20 mg total) by mouth daily., Disp: 30 capsule, Rfl: 1 ?  potassium chloride (KLOR-CON) 10 MEQ tablet, TAKE 1 TABLET BY MOUTH THREE TIMES DAILY, Disp: 270 tablet, Rfl: 0 ?  sertraline (ZOLOFT) 100 MG tablet, Take 1 tablet (100 mg total) by mouth daily., Disp: 90 tablet, Rfl: 1 ? ? ?Vital signs in last 24 hrs: ?Vitals:  ? 09/27/21 0916  ?BP: 102/60  ?Pulse: 84  ? ?Wt Readings from Last 3 Encounters:  ?09/27/21 139 lb 4 oz (63.2 kg)  ?08/09/21 145 lb (65.8 kg)  ?08/06/21 145 lb 6 oz (65.9 kg)  ?Note weight down from last visit ?Physical Exam ? ?Well-appearing ?HEENT: sclera anicteric, oral mucosa moist without lesions ?Neck: supple, no thyromegaly, JVD or lymphadenopathy ?Cardiac: RRR without murmurs, S1S2 heard, no peripheral edema ?Pulm: clear to auscultation bilaterally, normal RR and effort noted ?Abdomen: soft, no tenderness, with active bowel sounds. No guarding or palpable hepatosplenomegaly. ?Skin; warm and dry, no jaundice or rash ? ?Labs: ? ?Her iron  studies are consistent with anemia chronic disease ?___________________________________________ ?Radiologic studies: ? ? ?____________________________________________ ?Other: ?Surgical [P], duodenal bulb, 2nd portion of duodenum, and distal duodenum ?BENIGN DUODENAL MUCOSA WITH NO DIAGNOSTIC ABNORMALITY ?2. Surgical [P], gastric antrum ?CHRONIC GASTRITIS WITH LYMPHOID AGGREGATE, REACTIVE EPITHELIAL CHANGES AND FOVEOLAR HYPERPLASIA ?HELICOBACTER PRESENT, RARE ORGANISMS (IHC, ADEQUATE CONTROL) ?NEGATIVE FOR  INTESTINAL METAPLASIA, DYSPLASIA AND CARCINOMA ?3. Surgical [P], gastric body ?CHRONIC GASTRITIS WITH LYMPHOID AGGREGATE, REACTIVE EPITHELIAL CHANGES AND FOVEOLAR HYPERPLASIA ?HELICOBACTER STAIN NEGATIVE (IHC, ADEQUATE CONTROL) ?NEGATIVE FOR INTESTINAL METAPLASIA, DYSPLASIA AND CARCINOMA ? ?_____________________________________________ ?Assessment & Plan  ?Assessment: ?Encounter Diagnoses  ?Name Primary?  ? Altered bowel habits Yes  ? Microcytic anemia   ? H. pylori infection   ? ?Irregular bowel habits, I think it may be at least partially related to iron therapy.  Clinical picture not consistent with malabsorption. ?Her microcytic anemia does not appear to be from chronic GI blood loss.  I think she may have sickle trait or other hemoglobinopathy.  Recommended primary care follow-up for periodic check of hemoglobin and consideration of hemoglobin electrophoresis.  I will copy my note from today to PCP ? ?H. pylori that appears to been the cause of her gastritis and some upper digestive symptoms.  Completed antibiotic therapy ? ? ? ?Plan: ?Urea breath test ?Follow-up with primary care regarding chronic microcytic anemia and consideration of hemoglobin electrophoresis with discontinuation of iron. ?I recommended Brooke [name] stop the iron and I do not see it being helpful for her and may be causing digestive side effects. ? ? ?23 minutes were spent on this encounter (including chart review, history/exam, counseling/coordination of care, and documentation) > 50% of that time was spent on counseling and coordination of care.  ? ?Nelida Meuse III ? ?

## 2021-09-27 NOTE — Patient Instructions (Addendum)
If you are age 73 or older, your body mass index should be between 23-30. Your Body mass index is 26.31 kg/m?Marland Kitchen If this is out of the aforementioned range listed, please consider follow up with your Primary Care Provider. ? ?If you are age 35 or younger, your body mass index should be between 19-25. Your Body mass index is 26.31 kg/m?Marland Kitchen If this is out of the aformentioned range listed, please consider follow up with your Primary Care Provider.  ? ?________________________________________________________ ? ?The [facility] providers would like to encourage you to use [facility] to communicate with providers for non-urgent requests or questions.  Due to long hold times on the telephone, sending your provider a message by [facility] may be a faster and more efficient way to get a response.  Please allow 48 business hours for a response.  Please remember that this is for non-urgent requests.  ?_______________________________________________________ ? ?Please complete a H. Pylori/Urea Breath Test. This needs to be completed at a [place] lab location.   ? ?[facility] ?[place] [phone] ? ?Please follow the instructions below to prepare for your test: ? ?(one day prior to your test) avoid high gas foods, high fiber goods, fruits and fruit juices, vegetables, beans, cereals, fiber supplements, onion and bell peppers. You may eat hamburger, chicken, beef, tuna fish and eggs. ? ?You should have nothing to eat or drink after 9:00 pm the day before.  ?Do not smoke, chew gum or eat hard candy the morning of your test. ? ?You may not have the test within 2 weeks after taking any antibiotics or within 4 weeks if confirming eradication of H.Pylori. ? ?Stop omeprazole for 1 week prior  ? ?You may use over the counter strength Axid, Pepcid, Tagamet and Zantac 2 weeks prior to your tests. However, you should STOP these 24 hours before the test. You may use Tums and Maalox. ? ? ? ?  ? ?It was a pleasure to see you  today! ? ?Thank you for trusting me with your gastrointestinal care!   ?  ? ? ? ?

## 2021-09-28 ENCOUNTER — Other Ambulatory Visit: Payer: Self-pay | Admitting: Internal Medicine

## 2021-10-10 ENCOUNTER — Telehealth: Payer: Self-pay

## 2021-10-10 DIAGNOSIS — R194 Change in bowel habit: Secondary | ICD-10-CM | POA: Diagnosis not present

## 2021-10-10 DIAGNOSIS — D509 Iron deficiency anemia, unspecified: Secondary | ICD-10-CM | POA: Diagnosis not present

## 2021-10-10 DIAGNOSIS — A048 Other specified bacterial intestinal infections: Secondary | ICD-10-CM | POA: Diagnosis not present

## 2021-10-10 NOTE — Telephone Encounter (Signed)
Called patient today as a reminder call for her Urea breath test. ?She had forgotten she needed to complete. She will try to go with in the week.  ?

## 2021-10-11 LAB — H. PYLORI BREATH TEST: H. pylori Breath Test: NOT DETECTED

## 2021-10-16 ENCOUNTER — Other Ambulatory Visit: Payer: Self-pay | Admitting: Nurse Practitioner

## 2021-10-16 ENCOUNTER — Other Ambulatory Visit: Payer: Self-pay | Admitting: Internal Medicine

## 2021-10-18 DIAGNOSIS — H04122 Dry eye syndrome of left lacrimal gland: Secondary | ICD-10-CM | POA: Diagnosis not present

## 2021-10-29 ENCOUNTER — Ambulatory Visit: Payer: Medicare Other | Admitting: Internal Medicine

## 2021-11-01 ENCOUNTER — Other Ambulatory Visit: Payer: Self-pay | Admitting: Internal Medicine

## 2021-11-12 ENCOUNTER — Encounter: Payer: Self-pay | Admitting: Internal Medicine

## 2021-11-12 ENCOUNTER — Other Ambulatory Visit: Payer: Self-pay | Admitting: Internal Medicine

## 2021-11-12 ENCOUNTER — Other Ambulatory Visit: Payer: Self-pay | Admitting: Nurse Practitioner

## 2021-11-12 NOTE — Progress Notes (Signed)
      Subjective:    Patient ID: Brooke [name], female    DOB: 1950/12/25, 71 y.o.   MRN: [id number]     HPI Brooke [name] is here for follow up of her chronic medical problems, including htn, hld, prediabetes, grieving, insomnia  Dx with H.Pylori - cause of gastritis and upper GI symptoms.   Irregular BMs - per Dr Brooke [name] partially related to iron therapy.  Microcytic anemia not likely GI loss.   Medications and allergies reviewed with patient and updated if appropriate.  Current Outpatient Medications on File Prior to Visit  Medication Sig Dispense Refill   amLODipine (NORVASC) 10 MG tablet Take 1 tablet by mouth once daily 90 tablet 0   aspirin 81 MG chewable tablet Chew 81 mg by mouth daily.     atenolol (TENORMIN) 25 MG tablet Take 1 tablet by mouth once daily 90 tablet 1   atorvastatin (LIPITOR) 20 MG tablet Take 1 tablet by mouth once daily 90 tablet 0   dicyclomine (BENTYL) 10 MG capsule TAKE 1 CAPSULE BY MOUTH ONCE DAILY WITH BREAKFAST 30 capsule 0   Eszopiclone 3 MG TABS TAKE ONE TABLET BY MOUTH IMMEDIATELY BEFORE BEDTIME 30 tablet 0   ferrous sulfate 325 (65 FE) MG tablet Take 325 mg by mouth daily.     omeprazole (PRILOSEC) 20 MG capsule Take 1 capsule by mouth once daily 30 capsule 0   potassium chloride (KLOR-CON) 10 MEQ tablet TAKE 1 TABLET BY MOUTH THREE TIMES DAILY 270 tablet 0   sertraline (ZOLOFT) 100 MG tablet Take 1 tablet by mouth once daily 90 tablet 1   [DISCONTINUED] potassium chloride (KLOR-CON M10) 10 MEQ tablet Take 1 tablet (10 mEq total) by mouth three times daily. 90 tablet 0   No current facility-administered medications on file prior to visit.     Review of Systems     Objective:  There were no vitals filed for this visit. BP Readings from Last 3 Encounters:  09/27/21 102/60  08/09/21 106/64  08/06/21 124/60   Wt Readings from Last 3 Encounters:  09/27/21 139 lb 4 oz (63.2 kg)  08/09/21 145 lb (65.8 kg)  08/06/21 145 lb 6 oz (65.9 kg)    There is no height or weight on file to calculate BMI.    Physical Exam     Lab Results  Component Value Date   WBC 7.2 08/06/2021   HGB 11.5 (L) 08/06/2021   HCT 36.7 08/06/2021   PLT 241.0 08/06/2021   GLUCOSE 100 (H) 08/06/2021   CHOL 156 08/06/2021   TRIG 108.0 08/06/2021   HDL 58.60 08/06/2021   LDLDIRECT 162.0 08/05/2012   LDLCALC 76 08/06/2021   ALT 10 08/06/2021   AST 16 08/06/2021   NA 142 08/06/2021   K 4.2 08/06/2021   CL 107 08/06/2021   CREATININE 0.91 08/06/2021   BUN 12 08/06/2021   CO2 27 08/06/2021   TSH 1.79 08/06/2021   INR 1.9 (H) 04/15/2008   HGBA1C 5.6 08/06/2021   MICROALBUR 24.2 (H) 03/19/2009     Assessment & Plan:    See Problem List for Assessment and Plan of chronic medical problems.   This encounter was created in error - please disregard.

## 2021-11-12 NOTE — Patient Instructions (Addendum)
     Blood work was ordered.     Medications changes include :      Your prescription(s) have been sent to your pharmacy.    A referral was ordered for XX.     Someone from that office will call you to schedule an appointment.    Return in about 6 months (around 05/15/2022) for follow up.  

## 2021-11-13 ENCOUNTER — Encounter: Payer: Medicare Other | Admitting: Internal Medicine

## 2021-11-13 DIAGNOSIS — I10 Essential (primary) hypertension: Secondary | ICD-10-CM

## 2021-11-13 DIAGNOSIS — G479 Sleep disorder, unspecified: Secondary | ICD-10-CM

## 2021-11-13 DIAGNOSIS — E7849 Other hyperlipidemia: Secondary | ICD-10-CM

## 2021-11-13 DIAGNOSIS — F4321 Adjustment disorder with depressed mood: Secondary | ICD-10-CM

## 2021-11-13 DIAGNOSIS — R7303 Prediabetes: Secondary | ICD-10-CM

## 2021-11-19 ENCOUNTER — Encounter: Payer: Self-pay | Admitting: Internal Medicine

## 2021-11-19 NOTE — Progress Notes (Unsigned)
      Subjective:    Patient ID: Brooke [name], female    DOB: 1951/03/21, 71 y.o.   MRN: [id number]     HPI Girl is here for follow up of her chronic medical problems, including htn, hld, prediabetes, grieving, insomnia  Dx with H.Pylori - cause of gastritis and upper GI symptoms.   Irregular BMs - per Dr Hilarie Fredrickson partially related to iron therapy.  Microcytic anemia not likely GI loss.   Medications and allergies reviewed with patient and updated if appropriate.  Current Outpatient Medications on File Prior to Visit  Medication Sig Dispense Refill   amLODipine (NORVASC) 10 MG tablet Take 1 tablet by mouth once daily 90 tablet 0   aspirin 81 MG chewable tablet Chew 81 mg by mouth daily.     atenolol (TENORMIN) 25 MG tablet Take 1 tablet by mouth once daily 90 tablet 1   atorvastatin (LIPITOR) 20 MG tablet Take 1 tablet by mouth once daily 90 tablet 0   dicyclomine (BENTYL) 10 MG capsule TAKE 1 CAPSULE BY MOUTH ONCE DAILY WITH BREAKFAST 30 capsule 0   Eszopiclone 3 MG TABS TAKE ONE TABLET BY MOUTH IMMEDIATELY BEFORE BEDTIME 30 tablet 0   ferrous sulfate 325 (65 FE) MG tablet Take 325 mg by mouth daily.     omeprazole (PRILOSEC) 20 MG capsule Take 1 capsule by mouth once daily 30 capsule 0   potassium chloride (KLOR-CON) 10 MEQ tablet TAKE 1 TABLET BY MOUTH THREE TIMES DAILY 270 tablet 0   sertraline (ZOLOFT) 100 MG tablet Take 1 tablet by mouth once daily 90 tablet 1   [DISCONTINUED] potassium chloride (KLOR-CON M10) 10 MEQ tablet Take 1 tablet (10 mEq total) by mouth three times daily. 90 tablet 0   No current facility-administered medications on file prior to visit.     Review of Systems     Objective:  There were no vitals filed for this visit. BP Readings from Last 3 Encounters:  09/27/21 102/60  08/09/21 106/64  08/06/21 124/60   Wt Readings from Last 3 Encounters:  09/27/21 139 lb 4 oz (63.2 kg)  08/09/21 145 lb (65.8 kg)  08/06/21 145 lb 6 oz (65.9 kg)    There is no height or weight on file to calculate BMI.    Physical Exam     Lab Results  Component Value Date   WBC 7.2 08/06/2021   HGB 11.5 (L) 08/06/2021   HCT 36.7 08/06/2021   PLT 241.0 08/06/2021   GLUCOSE 100 (H) 08/06/2021   CHOL 156 08/06/2021   TRIG 108.0 08/06/2021   HDL 58.60 08/06/2021   LDLDIRECT 162.0 08/05/2012   LDLCALC 76 08/06/2021   ALT 10 08/06/2021   AST 16 08/06/2021   NA 142 08/06/2021   K 4.2 08/06/2021   CL 107 08/06/2021   CREATININE 0.91 08/06/2021   BUN 12 08/06/2021   CO2 27 08/06/2021   TSH 1.79 08/06/2021   INR 1.9 (H) 04/15/2008   HGBA1C 5.6 08/06/2021   MICROALBUR 24.2 (H) 03/19/2009     Assessment & Plan:    See Problem List for Assessment and Plan of chronic medical problems.

## 2021-11-20 ENCOUNTER — Ambulatory Visit (INDEPENDENT_AMBULATORY_CARE_PROVIDER_SITE_OTHER): Payer: Medicare Other | Admitting: Internal Medicine

## 2021-11-20 ENCOUNTER — Ambulatory Visit: Payer: Medicare Other | Admitting: Internal Medicine

## 2021-11-20 VITALS — BP 110/80 | HR 69 | Temp 98.1°F | Ht 61.0 in | Wt 147.0 lb

## 2021-11-20 DIAGNOSIS — D509 Iron deficiency anemia, unspecified: Secondary | ICD-10-CM | POA: Diagnosis not present

## 2021-11-20 DIAGNOSIS — E7849 Other hyperlipidemia: Secondary | ICD-10-CM | POA: Diagnosis not present

## 2021-11-20 DIAGNOSIS — R7303 Prediabetes: Secondary | ICD-10-CM

## 2021-11-20 DIAGNOSIS — F4321 Adjustment disorder with depressed mood: Secondary | ICD-10-CM

## 2021-11-20 DIAGNOSIS — I10 Essential (primary) hypertension: Secondary | ICD-10-CM

## 2021-11-20 DIAGNOSIS — G479 Sleep disorder, unspecified: Secondary | ICD-10-CM

## 2021-11-20 LAB — CBC WITH DIFFERENTIAL/PLATELET
Basophils Absolute: 0.1 10*3/uL (ref 0.0–0.1)
Basophils Relative: 1.1 % (ref 0.0–3.0)
Eosinophils Absolute: 0.1 10*3/uL (ref 0.0–0.7)
Eosinophils Relative: 1.5 % (ref 0.0–5.0)
HCT: 38.7 % (ref 36.0–46.0)
Hemoglobin: 12.4 g/dL (ref 12.0–15.0)
Lymphocytes Relative: 20.6 % (ref 12.0–46.0)
Lymphs Abs: 1.8 10*3/uL (ref 0.7–4.0)
MCHC: 31.9 g/dL (ref 30.0–36.0)
MCV: 74.3 fl — ABNORMAL LOW (ref 78.0–100.0)
Monocytes Absolute: 0.6 10*3/uL (ref 0.1–1.0)
Monocytes Relative: 7.4 % (ref 3.0–12.0)
Neutro Abs: 6 10*3/uL (ref 1.4–7.7)
Neutrophils Relative %: 69.4 % (ref 43.0–77.0)
Platelets: 211 10*3/uL (ref 150.0–400.0)
RBC: 5.21 Mil/uL — ABNORMAL HIGH (ref 3.87–5.11)
RDW: 16.6 % — ABNORMAL HIGH (ref 11.5–15.5)
WBC: 8.7 10*3/uL (ref 4.0–10.5)

## 2021-11-20 LAB — COMPREHENSIVE METABOLIC PANEL
ALT: 26 U/L (ref 0–35)
AST: 23 U/L (ref 0–37)
Albumin: 3.8 g/dL (ref 3.5–5.2)
Alkaline Phosphatase: 84 U/L (ref 39–117)
BUN: 16 mg/dL (ref 6–23)
CO2: 29 mEq/L (ref 19–32)
Calcium: 10.2 mg/dL (ref 8.4–10.5)
Chloride: 105 mEq/L (ref 96–112)
Creatinine, Ser: 0.9 mg/dL (ref 0.40–1.20)
GFR: 63.83 mL/min (ref >60.00)
Glucose, Bld: 105 mg/dL — ABNORMAL HIGH (ref 70–99)
Potassium: 4.2 mEq/L (ref 3.5–5.1)
Sodium: 141 mEq/L (ref 135–145)
Total Bilirubin: 0.4 mg/dL (ref 0.2–1.2)
Total Protein: 6.9 g/dL (ref 6.0–8.3)

## 2021-11-20 LAB — IBC PANEL
Iron: 68 ug/dL (ref 42–145)
Saturation Ratios: 21.3 % (ref 20.0–50.0)
TIBC: 319.2 ug/dL (ref 250.0–450.0)
Transferrin: 228 mg/dL (ref 212.0–360.0)

## 2021-11-20 LAB — LIPID PANEL
Cholesterol: 186 mg/dL (ref 0–200)
HDL: 78.4 mg/dL (ref >39.00)
LDL Cholesterol: 86 mg/dL (ref 0–99)
NonHDL: 107.39
Total CHOL/HDL Ratio: 2
Triglycerides: 105 mg/dL (ref 0.0–149.0)
VLDL: 21 mg/dL (ref 0.0–40.0)

## 2021-11-20 LAB — HEMOGLOBIN A1C: Hgb A1c MFr Bld: 5.6 % (ref 4.6–6.5)

## 2021-11-20 LAB — FERRITIN: Ferritin: 213.1 ng/mL (ref 10.0–291.0)

## 2021-11-20 NOTE — Assessment & Plan Note (Signed)
Chronic Check a1c Low sugar / carb diet Stressed regular exercise  

## 2021-11-20 NOTE — Assessment & Plan Note (Signed)
Chronic BP well controlled Continue amlodipine 10 mg daily, atenolol 25 mg daily

## 2021-11-20 NOTE — Assessment & Plan Note (Signed)
Chronic Controlled - as good as it can be Continue sertraline 100 mg daily

## 2021-11-20 NOTE — Patient Instructions (Addendum)
     Blood work was ordered.     Medications changes include :   none      Return in about 6 months (around 05/22/2022) for follow up.

## 2021-11-20 NOTE — Assessment & Plan Note (Signed)
Chronic Started when her husbands death Taking lunesta  3 mg HS - it does helps some, but some nights she still does not sleep

## 2021-11-20 NOTE — Assessment & Plan Note (Signed)
Chronic Check lipid panel  Continue atorvastatin 20 mg daily Regular exercise and healthy diet encouraged  

## 2021-11-22 LAB — HGB FRACTIONATION CASCADE
Hgb A2: 2.4 % (ref 1.8–3.2)
Hgb A: 97.6 % (ref 96.4–98.8)
Hgb F: 0 % (ref 0.0–2.0)
Hgb S: 0 %

## 2021-11-29 ENCOUNTER — Other Ambulatory Visit: Payer: Self-pay | Admitting: Internal Medicine

## 2021-11-29 ENCOUNTER — Other Ambulatory Visit: Payer: Self-pay

## 2021-11-29 MED ORDER — ESZOPICLONE 3 MG PO TABS
ORAL_TABLET | ORAL | 0 refills | Status: DC
Start: 2021-11-29 — End: 2021-12-02

## 2021-12-02 ENCOUNTER — Other Ambulatory Visit: Payer: Self-pay | Admitting: Internal Medicine

## 2021-12-02 MED ORDER — ESZOPICLONE 3 MG PO TABS: ORAL_TABLET | ORAL | 0 refills | Status: DC

## 2021-12-03 ENCOUNTER — Ambulatory Visit: Payer: Medicare Other | Admitting: Internal Medicine

## 2021-12-06 ENCOUNTER — Other Ambulatory Visit: Payer: Self-pay | Admitting: Internal Medicine

## 2021-12-06 DIAGNOSIS — H00024 Hordeolum internum left upper eyelid: Secondary | ICD-10-CM | POA: Diagnosis not present

## 2021-12-11 ENCOUNTER — Encounter: Payer: Self-pay | Admitting: Gastroenterology

## 2021-12-11 ENCOUNTER — Ambulatory Visit: Payer: Medicare Other | Admitting: Gastroenterology

## 2021-12-11 VITALS — BP 120/66 | HR 59 | Ht 61.0 in | Wt 146.5 lb

## 2021-12-11 DIAGNOSIS — R630 Anorexia: Secondary | ICD-10-CM

## 2021-12-11 DIAGNOSIS — R194 Change in bowel habit: Secondary | ICD-10-CM | POA: Diagnosis not present

## 2021-12-11 NOTE — Patient Instructions (Signed)
If you are age 73 or older, your body mass index should be between 23-30. Your Body mass index is 27.68 kg/m. If this is out of the aforementioned range listed, please consider follow up with your Primary Care Provider.  If you are age 75 or younger, your body mass index should be between 19-25. Your Body mass index is 27.68 kg/m. If this is out of the aformentioned range listed, please consider follow up with your Primary Care Provider.   ________________________________________________________  The [facility] providers would like to encourage you to use [facility] to communicate with providers for non-urgent requests or questions.  Due to long hold times on the telephone, sending your provider a message by [facility] may be a faster and more efficient way to get a response.  Please allow 48 business hours for a response.  Please remember that this is for non-urgent requests.  _______________________________________________________  STOP THE IRON!!!

## 2021-12-11 NOTE — Progress Notes (Signed)
[facility] Progress Note  Chief Complaint: Bowel habit irregularity  Subjective  History: I last saw Modene on April 21 for irregular bowel habits that seem to at least in part due to iron treatments.  I asked her to stop the iron since she had a chronic microcytic anemia with normal iron levels suggestive of ACD or a hemoglobinopathy.  H. pylori had been discovered on recent upper endoscopy treated with antibiotics and follow-up UBT on 10/10/2021 was negative.  Alexah reports that at times her stool is dark and of variable consistency.  She does not have abdominal pain, difficulty passing bowel movements, rectal bleeding, nausea vomiting or dysphagia.  Her appetite is fair much of the time, and she has lost interest in preparing meals much of the time.  She feels hungry but then the thought of making a meal turns her off.  It has been this way since the loss of her husband Eulas Post last year.  She also does not sleep well and says her mood is sad much of the time.  She was offered counseling by her church but did not feel she needed them, and thinks that she is coping well and getting a little better all the time.  She was started on some sleep medicine by Dr. Quay Burow but does not feel it is helping.  ROS: Cardiovascular:  no chest pain Respiratory: no dyspnea  The patient's Past Medical, Family and Social History were reviewed and are on file in the EMR.  Objective:  Med list reviewed  Current Outpatient Medications:    amLODipine (NORVASC) 10 MG tablet, Take 1 tablet by mouth once daily, Disp: 90 tablet, Rfl: 0   aspirin 81 MG chewable tablet, Chew 81 mg by mouth daily., Disp: , Rfl:    atenolol (TENORMIN) 25 MG tablet, Take 1 tablet by mouth once daily, Disp: 90 tablet, Rfl: 1   atorvastatin (LIPITOR) 20 MG tablet, Take 1 tablet by mouth once daily, Disp: 90 tablet, Rfl: 0   dicyclomine (BENTYL) 10 MG capsule, TAKE 1 CAPSULE BY MOUTH ONCE DAILY WITH BREAKFAST, Disp: 30 capsule,  Rfl: 0   doxycycline (VIBRA-TABS) 100 MG tablet, Take 100 mg by mouth 2 (two) times daily., Disp: , Rfl:    Eszopiclone 3 MG TABS, TAKE 1 TABLET BY MOUTH IMMEDIATELY BEFORE BEDTIME, Disp: 30 tablet, Rfl: 0   ferrous sulfate 325 (65 FE) MG tablet, Take 325 mg by mouth daily., Disp: , Rfl:    neomycin-polymyxin b-dexamethasone (MAXITROL) 3.5-10000-0.1 OINT, SMARTSIG:1 Inch(es) In Eye(s) Every Night, Disp: , Rfl:    omeprazole (PRILOSEC) 20 MG capsule, Take 1 capsule by mouth once daily, Disp: 30 capsule, Rfl: 0   potassium chloride (KLOR-CON) 10 MEQ tablet, TAKE 1 TABLET BY MOUTH THREE TIMES DAILY, Disp: 270 tablet, Rfl: 0   prednisoLONE acetate (PRED FORTE) 1 % ophthalmic suspension, Place 1 drop into both eyes 4 (four) times daily., Disp: , Rfl:    sertraline (ZOLOFT) 100 MG tablet, Take 1 tablet by mouth once daily, Disp: 90 tablet, Rfl: 1   Vital signs in last 24 hrs: Vitals:   12/11/21 1414  BP: 120/66  Pulse: (!) 59   Wt Readings from Last 3 Encounters:  12/11/21 146 lb 8 oz (66.5 kg)  11/20/21 147 lb (66.7 kg)  09/27/21 139 lb 4 oz (63.2 kg)    Physical Exam  Note weight up since last visit  Cardiac: Regular without murmur,  no peripheral edema Pulm: clear to auscultation bilaterally,  normal RR and effort noted Abdomen: soft, no tenderness, with active bowel sounds. No guarding or palpable hepatosplenomegaly.   Labs:  Iron/TIBC/Ferritin/ %Sat    Component Value Date/Time   IRON 68 11/20/2021 1432   TIBC 319.2 11/20/2021 1432   FERRITIN 213.1 11/20/2021 1432   IRONPCTSAT 21.3 11/20/2021 1432   IRONPCTSAT 13 (L) 08/06/2021 1423      Latest Ref Rng & Units 11/20/2021    2:32 PM 08/06/2021    2:23 PM 07/17/2021    3:43 PM  CBC  WBC 4.0 - 10.5 K/uL 8.7  7.2  8.9   Hemoglobin 12.0 - 15.0 g/dL 12.4  11.5  11.0   Hematocrit 36.0 - 46.0 % 38.7  36.7  35.0   Platelets 150.0 - 400.0 K/uL 211.0  241.0  130.0     ___________________________________________ Radiologic  studies:   ____________________________________________ Other:   _____________________________________________ Assessment & Plan  Assessment: Encounter Diagnoses  Name Primary?   Altered bowel habits Yes   Loss of appetite     She continues to have changes in bowel character that I believe is related to iron.  As previously noted, she has chronic microcytic anemia with normal iron levels and I again recommended she stop taking iron.  (She has some difficulty with her memory and had not recalled that previous advice)  In addition, I think she has loss of appetite from depression and grieving after the loss of her husband with associated decreased mood and altered sleep.  Plan: No further testing planned at this point.  Stop taking iron tablets  I will copy my note to primary care for consideration of alternate pharmacologic therapy that might help her sleep, mood and appetite. Shantera will otherwise return to see me as needed.    Nelida Meuse III

## 2021-12-14 ENCOUNTER — Other Ambulatory Visit: Payer: Self-pay | Admitting: Internal Medicine

## 2021-12-19 DIAGNOSIS — H00024 Hordeolum internum left upper eyelid: Secondary | ICD-10-CM | POA: Diagnosis not present

## 2021-12-25 DIAGNOSIS — H10023 Other mucopurulent conjunctivitis, bilateral: Secondary | ICD-10-CM | POA: Diagnosis not present

## 2021-12-31 ENCOUNTER — Encounter: Payer: Self-pay | Admitting: Internal Medicine

## 2021-12-31 ENCOUNTER — Ambulatory Visit (INDEPENDENT_AMBULATORY_CARE_PROVIDER_SITE_OTHER): Payer: Medicare Other | Admitting: Internal Medicine

## 2021-12-31 VITALS — BP 114/66 | HR 80 | Temp 98.0°F | Ht 61.0 in | Wt 148.0 lb

## 2021-12-31 DIAGNOSIS — Z1231 Encounter for screening mammogram for malignant neoplasm of breast: Secondary | ICD-10-CM | POA: Diagnosis not present

## 2021-12-31 DIAGNOSIS — G479 Sleep disorder, unspecified: Secondary | ICD-10-CM | POA: Diagnosis not present

## 2021-12-31 DIAGNOSIS — F329 Major depressive disorder, single episode, unspecified: Secondary | ICD-10-CM

## 2021-12-31 DIAGNOSIS — I10 Essential (primary) hypertension: Secondary | ICD-10-CM | POA: Diagnosis not present

## 2021-12-31 DIAGNOSIS — Z13820 Encounter for screening for osteoporosis: Secondary | ICD-10-CM

## 2021-12-31 NOTE — Assessment & Plan Note (Signed)
Chronic Controlled, stable Continue  sertraline 100 mg daily 

## 2021-12-31 NOTE — Assessment & Plan Note (Signed)
Chronic Better Very tired since her granddaughter has been staying with her Taking lunesta 3 mg on occasion only - continue only as needed

## 2021-12-31 NOTE — Progress Notes (Signed)
Subjective:    Patient ID: Brooke [name], female    DOB: February 21, 1951, 71 y.o.   MRN: [id number]     HPI Brooke [name] is here for follow up of her chronic medical problems, including depression, insomnia, htn, hld, prediabetes  Her granddaughter who is 7 is staying with her now which is helping.   Sleep is better - does not take sleep med nightly - takes it once in a while.  Mood - denies anxiety.  Has some mild depression at times, but it is better.   Having issues with her eye.    Medications and allergies reviewed with patient and updated if appropriate.  Current Outpatient Medications on File Prior to Visit  Medication Sig Dispense Refill   amLODipine (NORVASC) 10 MG tablet Take 1 tablet by mouth once daily 90 tablet 0   aspirin 81 MG chewable tablet Chew 81 mg by mouth daily.     atenolol (TENORMIN) 25 MG tablet Take 1 tablet by mouth once daily 90 tablet 1   atorvastatin (LIPITOR) 20 MG tablet Take 1 tablet by mouth once daily 90 tablet 0   dicyclomine (BENTYL) 10 MG capsule TAKE 1 CAPSULE BY MOUTH ONCE DAILY WITH BREAKFAST 30 capsule 0   doxycycline (VIBRA-TABS) 100 MG tablet Take 100 mg by mouth 2 (two) times daily.     Eszopiclone 3 MG TABS TAKE 1 TABLET BY MOUTH IMMEDIATELY BEFORE BEDTIME 30 tablet 0   neomycin-polymyxin b-dexamethasone (MAXITROL) 3.5-10000-0.1 OINT SMARTSIG:1 Inch(es) In Eye(s) Every Night     omeprazole (PRILOSEC) 20 MG capsule Take 1 capsule by mouth once daily 30 capsule 0   potassium chloride (KLOR-CON) 10 MEQ tablet TAKE 1 TABLET BY MOUTH THREE TIMES DAILY 270 tablet 0   prednisoLONE acetate (PRED FORTE) 1 % ophthalmic suspension Place 1 drop into both eyes 4 (four) times daily.     sertraline (ZOLOFT) 100 MG tablet Take 1 tablet by mouth once daily 90 tablet 1   [DISCONTINUED] potassium chloride (KLOR-CON M10) 10 MEQ tablet Take 1 tablet (10 mEq total) by mouth three times daily. 90 tablet 0   No current facility-administered medications on  file prior to visit.     Review of Systems  Constitutional:  Negative for fever.       Appetite is not great but better.  Energy improved  Respiratory:  Negative for cough, shortness of breath and wheezing.   Cardiovascular:  Negative for chest pain, palpitations and leg swelling.  Gastrointestinal:  Negative for abdominal pain, constipation, diarrhea and nausea.       No gerd  Neurological:  Negative for light-headedness and headaches.  Psychiatric/Behavioral:  Positive for dysphoric mood (mild - better) and sleep disturbance (but better). The patient is not nervous/anxious.        Objective:   Vitals:   12/31/21 1341  BP: 114/66  Pulse: 80  Temp: 98 F (36.7 C)  SpO2: 95%   BP Readings from Last 3 Encounters:  12/31/21 114/66  12/11/21 120/66  11/20/21 110/80   Wt Readings from Last 3 Encounters:  12/31/21 148 lb (67.1 kg)  12/11/21 146 lb 8 oz (66.5 kg)  11/20/21 147 lb (66.7 kg)   Body mass index is 27.96 kg/m.    Physical Exam Constitutional:      General: She is not in acute distress.    Appearance: Normal appearance.  HENT:     Head: Normocephalic and atraumatic.  Eyes:     Conjunctiva/sclera: Conjunctivae normal.  Cardiovascular:     Rate and Rhythm: Normal rate and regular rhythm.     Heart sounds: Normal heart sounds. No murmur heard. Pulmonary:     Effort: Pulmonary effort is normal. No respiratory distress.     Breath sounds: Normal breath sounds. No wheezing.  Musculoskeletal:     Cervical back: Neck supple.     Right lower leg: No edema.     Left lower leg: No edema.  Lymphadenopathy:     Cervical: No cervical adenopathy.  Skin:    General: Skin is warm and dry.     Findings: No rash.  Neurological:     Mental Status: She is alert. Mental status is at baseline.  Psychiatric:        Mood and Affect: Mood normal.        Behavior: Behavior normal.        Lab Results  Component Value Date   WBC 8.7 11/20/2021   HGB 12.4 11/20/2021    HCT 38.7 11/20/2021   PLT 211.0 11/20/2021   GLUCOSE 105 (H) 11/20/2021   CHOL 186 11/20/2021   TRIG 105.0 11/20/2021   HDL 78.40 11/20/2021   LDLDIRECT 162.0 08/05/2012   LDLCALC 86 11/20/2021   ALT 26 11/20/2021   AST 23 11/20/2021   NA 141 11/20/2021   K 4.2 11/20/2021   CL 105 11/20/2021   CREATININE 0.90 11/20/2021   BUN 16 11/20/2021   CO2 29 11/20/2021   TSH 1.79 08/06/2021   INR 1.9 (H) 04/15/2008   HGBA1C 5.6 11/20/2021   MICROALBUR 24.2 (H) 03/19/2009     Assessment & Plan:    See Problem List for Assessment and Plan of chronic medical problems.

## 2021-12-31 NOTE — Patient Instructions (Addendum)
    Call and schedule your mammogram - [facility] Schedule an appointment by calling [phone]     Medications changes include :   none    Return in about 6 months (around 07/03/2022) for Physical Exam - cancel december appt.

## 2021-12-31 NOTE — Assessment & Plan Note (Signed)
Chronic BP well controlled Continue amlodipine 10 mg daily, atenolol 25 mg daily cmp

## 2022-01-02 ENCOUNTER — Other Ambulatory Visit: Payer: Self-pay | Admitting: Internal Medicine

## 2022-01-03 DIAGNOSIS — H04003 Unspecified dacryoadenitis, bilateral lacrimal glands: Secondary | ICD-10-CM | POA: Diagnosis not present

## 2022-02-06 ENCOUNTER — Other Ambulatory Visit: Payer: Self-pay | Admitting: Internal Medicine

## 2022-02-13 DIAGNOSIS — H2513 Age-related nuclear cataract, bilateral: Secondary | ICD-10-CM | POA: Diagnosis not present

## 2022-02-13 DIAGNOSIS — H04552 Acquired stenosis of left nasolacrimal duct: Secondary | ICD-10-CM | POA: Diagnosis not present

## 2022-02-13 DIAGNOSIS — H02132 Senile ectropion of right lower eyelid: Secondary | ICD-10-CM | POA: Diagnosis not present

## 2022-02-13 DIAGNOSIS — H04222 Epiphora due to insufficient drainage, left lacrimal gland: Secondary | ICD-10-CM | POA: Diagnosis not present

## 2022-02-13 DIAGNOSIS — H0279 Other degenerative disorders of eyelid and periocular area: Secondary | ICD-10-CM | POA: Diagnosis not present

## 2022-02-13 DIAGNOSIS — H02135 Senile ectropion of left lower eyelid: Secondary | ICD-10-CM | POA: Diagnosis not present

## 2022-02-20 ENCOUNTER — Ambulatory Visit (INDEPENDENT_AMBULATORY_CARE_PROVIDER_SITE_OTHER): Payer: Medicare Other | Admitting: Family Medicine

## 2022-02-20 ENCOUNTER — Encounter: Payer: Self-pay | Admitting: Family Medicine

## 2022-02-20 VITALS — BP 122/78 | HR 60 | Temp 97.6°F | Ht 61.0 in | Wt 150.0 lb

## 2022-02-20 DIAGNOSIS — K921 Melena: Secondary | ICD-10-CM | POA: Diagnosis not present

## 2022-02-20 LAB — HEMOCCULT GUIAC POC 1CARD (OFFICE): Fecal Occult Blood, POC: NEGATIVE

## 2022-02-20 NOTE — Patient Instructions (Signed)
No sign of blood today on exam.   Please follow up if you notice any new or worsening symptoms.

## 2022-02-20 NOTE — Progress Notes (Unsigned)
Subjective:     Patient ID: Brooke [name], female    DOB: Feb 24, 1951, 71 y.o.   MRN: [id number]  Chief Complaint  Patient presents with   dark stool    Had it a month ago but just noticed it again today.     HPI Patient is in today for having 2 black stools. Denies blood, coffee grind or tarry appearance. States she feels fine and has not other symptoms.   States she had this occur a few months ago and was seeing GI. She is not currently taking iron.   Denies fever, chills, dizziness, chest pain, palpitations, shortness of breath, abdominal pain, N/V/D, urinary symptoms.     Health Maintenance Due  Topic Date Due   MAMMOGRAM  09/20/2021   DEXA SCAN  10/02/2021    Past Medical History:  Diagnosis Date   FH: CAD (coronary artery disease)    High cholesterol    Hypertension    Memory loss    Obesity     Past Surgical History:  Procedure Laterality Date   CARPAL TUNNEL RELEASE     bilateral   HIP SURGERY      Family History  Problem Relation Age of Onset   CAD Sister    CAD Father    Kidney disease Sister    Lung cancer Mother    Brain cancer Mother    Colon cancer Neg Hx    Stomach cancer Neg Hx    Pancreatic cancer Neg Hx    Esophageal cancer Neg Hx    Colon polyps Neg Hx    Rectal cancer Neg Hx     Social History   Socioeconomic History   Marital status: Married    Spouse name: Not on file   Number of children: 2   Years of education: 12th    Highest education level: Not on file  Occupational History   Occupation: Psychologist, sport and exercise for [employer]    Comment: Laid Off  Tobacco Use   Smoking status: Never   Smokeless tobacco: Never  Vaping Use   Vaping Use: Never used  Substance and Sexual Activity   Alcohol use: No   Drug use: No   Sexual activity: Not on file  Other Topics Concern   Not on file  Social History Narrative   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        Social Determinants of  Health   Financial Resource Strain: Low Risk  (03/14/2020)   Overall Financial Resource Strain (CARDIA)    Difficulty of Paying Living Expenses: Not hard at all  Food Insecurity: Not on file  Transportation Needs: Not on file  Physical Activity: Not on file  Stress: Not on file  Social Connections: Not on file  Intimate Partner Violence: Not on file    Outpatient Medications Prior to Visit  Medication Sig Dispense Refill   amLODipine (NORVASC) 10 MG tablet Take 1 tablet by mouth once daily 90 tablet 0   aspirin 81 MG chewable tablet Chew 81 mg by mouth daily.     atenolol (TENORMIN) 25 MG tablet Take 1 tablet by mouth once daily 90 tablet 1   atorvastatin (LIPITOR) 20 MG tablet Take 1 tablet by mouth once daily 90 tablet 0   dicyclomine (BENTYL) 10 MG capsule TAKE 1 CAPSULE BY MOUTH ONCE DAILY WITH BREAKFAST 30 capsule 0   Eszopiclone 3 MG TABS TAKE 1 TABLET BY MOUTH IMMEDIATELY BEFORE BEDTIME  30 tablet 0   neomycin-polymyxin b-dexamethasone (MAXITROL) 3.5-10000-0.1 OINT SMARTSIG:1 Inch(es) In Eye(s) Every Night     omeprazole (PRILOSEC) 20 MG capsule Take 1 capsule by mouth once daily 30 capsule 0   potassium chloride (KLOR-CON) 10 MEQ tablet TAKE 1 TABLET BY MOUTH THREE TIMES DAILY 270 tablet 1   prednisoLONE acetate (PRED FORTE) 1 % ophthalmic suspension Place 1 drop into both eyes 4 (four) times daily.     sertraline (ZOLOFT) 100 MG tablet Take 1 tablet by mouth once daily 90 tablet 1   No facility-administered medications prior to visit.    Allergies  Allergen Reactions   Penicillins Hives    ROS     Objective:    Physical Exam Exam conducted with a chaperone present.  Constitutional:      General: She is not in acute distress.    Appearance: She is not ill-appearing.  HENT:     Mouth/Throat:     Mouth: Mucous membranes are moist.  Eyes:     Conjunctiva/sclera: Conjunctivae normal.  Cardiovascular:     Rate and Rhythm: Normal rate and regular rhythm.   Pulmonary:     Effort: Pulmonary effort is normal.     Breath sounds: Normal breath sounds.  Abdominal:     General: Bowel sounds are normal. There is no distension.     Palpations: Abdomen is soft.     Tenderness: There is no abdominal tenderness. There is no guarding or rebound.  Genitourinary:    Rectum: Normal. Guaiac result negative. No tenderness or external hemorrhoid. Normal anal tone.  Skin:    General: Skin is warm and dry.  Neurological:     General: No focal deficit present.     Mental Status: She is alert and oriented to person, place, and time.  Psychiatric:        Mood and Affect: Mood normal.        Behavior: Behavior normal.     BP 122/78 (BP Location: Left Arm, Patient Position: Sitting, Cuff Size: Large)   Pulse 60   Temp 97.6 F (36.4 C) (Temporal)   Ht '5\' 1"'$  (1.549 m)   Wt 150 lb (68 kg)   SpO2 98%   BMI 28.34 kg/m  Wt Readings from Last 3 Encounters:  02/20/22 150 lb (68 kg)  12/31/21 148 lb (67.1 kg)  12/11/21 146 lb 8 oz (66.5 kg)       Assessment & Plan:   Problem List Items Addressed This Visit   None Visit Diagnoses     Black stools    -  Primary   Relevant Orders   POCT occult blood stool (Completed)      Negative DRE and hemoccult negative today.  Exam is benign.  She will continue to monitor stools and follow up here or with GI.   I am having Brooke [name] maintain her aspirin, atenolol, sertraline, atorvastatin, omeprazole, neomycin-polymyxin b-dexamethasone, Eszopiclone, prednisoLONE acetate, dicyclomine, potassium chloride, and amLODipine.  No orders of the defined types were placed in this encounter.

## 2022-02-27 ENCOUNTER — Telehealth: Payer: Medicare Other

## 2022-03-03 ENCOUNTER — Other Ambulatory Visit: Payer: Self-pay | Admitting: Nurse Practitioner

## 2022-03-03 ENCOUNTER — Other Ambulatory Visit: Payer: Self-pay | Admitting: Internal Medicine

## 2022-04-05 ENCOUNTER — Other Ambulatory Visit: Payer: Self-pay | Admitting: Internal Medicine

## 2022-05-06 ENCOUNTER — Other Ambulatory Visit: Payer: Self-pay

## 2022-05-06 DIAGNOSIS — H04222 Epiphora due to insufficient drainage, left lacrimal gland: Secondary | ICD-10-CM | POA: Diagnosis not present

## 2022-05-06 DIAGNOSIS — H04552 Acquired stenosis of left nasolacrimal duct: Secondary | ICD-10-CM | POA: Diagnosis not present

## 2022-05-06 DIAGNOSIS — H04512 Dacryolith of left lacrimal passage: Secondary | ICD-10-CM | POA: Diagnosis not present

## 2022-05-21 NOTE — Patient Instructions (Addendum)
      Blood work was ordered.   The lab is on the first floor.    Medications changes include :   start omeprazole tapering and try to use as needed      Return in about 6 months (around 11/21/2022) for Physical Exam.

## 2022-05-21 NOTE — Progress Notes (Unsigned)
      Subjective:    Patient ID: Brooke [name], female    DOB: 11-Jul-1950, 71 y.o.   MRN: [id number]     HPI Dossie is here for follow up of her chronic medical problems, including depression, insomnia, htn, hld, prediabetes  She is taking all of her medications as prescribed.    Medications and allergies reviewed with patient and updated if appropriate.  Current Outpatient Medications on File Prior to Visit  Medication Sig Dispense Refill   dicyclomine (BENTYL) 10 MG capsule TAKE 1 CAPSULE BY MOUTH ONCE DAILY WITH BREAKFAST 30 capsule 2   amLODipine (NORVASC) 10 MG tablet Take 1 tablet by mouth once daily 90 tablet 0   aspirin 81 MG chewable tablet Chew 81 mg by mouth daily.     atenolol (TENORMIN) 25 MG tablet Take 1 tablet by mouth once daily 90 tablet 1   atorvastatin (LIPITOR) 20 MG tablet Take 1 tablet by mouth once daily 90 tablet 0   Eszopiclone 3 MG TABS TAKE 1 TABLET BY MOUTH IMMEDIATELY BEFORE BEDTIME 30 tablet 0   neomycin-polymyxin b-dexamethasone (MAXITROL) 3.5-10000-0.1 OINT SMARTSIG:1 Inch(es) In Eye(s) Every Night     omeprazole (PRILOSEC) 20 MG capsule Take 1 capsule by mouth once daily 30 capsule 0   potassium chloride (KLOR-CON) 10 MEQ tablet TAKE 1 TABLET BY MOUTH THREE TIMES DAILY 270 tablet 1   prednisoLONE acetate (PRED FORTE) 1 % ophthalmic suspension Place 1 drop into both eyes 4 (four) times daily.     sertraline (ZOLOFT) 100 MG tablet Take 1 tablet by mouth once daily 90 tablet 0   [DISCONTINUED] potassium chloride (KLOR-CON M10) 10 MEQ tablet Take 1 tablet (10 mEq total) by mouth three times daily. 90 tablet 0   No current facility-administered medications on file prior to visit.     Review of Systems     Objective:  There were no vitals filed for this visit. BP Readings from Last 3 Encounters:  02/20/22 122/78  12/31/21 114/66  12/11/21 120/66   Wt Readings from Last 3 Encounters:  02/20/22 150 lb (68 kg)  12/31/21 148 lb (67.1 kg)   12/11/21 146 lb 8 oz (66.5 kg)   There is no height or weight on file to calculate BMI.    Physical Exam     Lab Results  Component Value Date   WBC 8.7 11/20/2021   HGB 12.4 11/20/2021   HCT 38.7 11/20/2021   PLT 211.0 11/20/2021   GLUCOSE 105 (H) 11/20/2021   CHOL 186 11/20/2021   TRIG 105.0 11/20/2021   HDL 78.40 11/20/2021   LDLDIRECT 162.0 08/05/2012   LDLCALC 86 11/20/2021   ALT 26 11/20/2021   AST 23 11/20/2021   NA 141 11/20/2021   K 4.2 11/20/2021   CL 105 11/20/2021   CREATININE 0.90 11/20/2021   BUN 16 11/20/2021   CO2 29 11/20/2021   TSH 1.79 08/06/2021   INR 1.9 (H) 04/15/2008   HGBA1C 5.6 11/20/2021   MICROALBUR 24.2 (H) 03/19/2009     Assessment & Plan:    See Problem List for Assessment and Plan of chronic medical problems.

## 2022-05-22 ENCOUNTER — Ambulatory Visit (INDEPENDENT_AMBULATORY_CARE_PROVIDER_SITE_OTHER): Payer: Medicare Other | Admitting: Internal Medicine

## 2022-05-22 ENCOUNTER — Encounter: Payer: Self-pay | Admitting: Internal Medicine

## 2022-05-22 VITALS — BP 112/60 | HR 57 | Temp 98.2°F | Ht 61.0 in | Wt 149.0 lb

## 2022-05-22 DIAGNOSIS — G479 Sleep disorder, unspecified: Secondary | ICD-10-CM | POA: Diagnosis not present

## 2022-05-22 DIAGNOSIS — F329 Major depressive disorder, single episode, unspecified: Secondary | ICD-10-CM

## 2022-05-22 DIAGNOSIS — I10 Essential (primary) hypertension: Secondary | ICD-10-CM

## 2022-05-22 DIAGNOSIS — R7303 Prediabetes: Secondary | ICD-10-CM | POA: Diagnosis not present

## 2022-05-22 DIAGNOSIS — E7849 Other hyperlipidemia: Secondary | ICD-10-CM

## 2022-05-22 LAB — HEMOGLOBIN A1C: Hgb A1c MFr Bld: 6.1 % (ref 4.6–6.5)

## 2022-05-22 LAB — COMPREHENSIVE METABOLIC PANEL
ALT: 14 U/L (ref 0–35)
AST: 19 U/L (ref 0–37)
Albumin: 4 g/dL (ref 3.5–5.2)
Alkaline Phosphatase: 78 U/L (ref 39–117)
BUN: 12 mg/dL (ref 6–23)
CO2: 30 mEq/L (ref 19–32)
Calcium: 10.5 mg/dL (ref 8.4–10.5)
Chloride: 106 mEq/L (ref 96–112)
Creatinine, Ser: 0.98 mg/dL (ref 0.40–1.20)
GFR: 57.43 mL/min — ABNORMAL LOW (ref >60.00)
Glucose, Bld: 100 mg/dL — ABNORMAL HIGH (ref 70–99)
Potassium: 4.1 mEq/L (ref 3.5–5.1)
Sodium: 141 mEq/L (ref 135–145)
Total Bilirubin: 0.6 mg/dL (ref 0.2–1.2)
Total Protein: 7.1 g/dL (ref 6.0–8.3)

## 2022-05-22 LAB — LIPID PANEL
Cholesterol: 260 mg/dL — ABNORMAL HIGH (ref 0–200)
HDL: 73.8 mg/dL (ref >39.00)
LDL Cholesterol: 169 mg/dL — ABNORMAL HIGH (ref 0–99)
NonHDL: 185.94
Total CHOL/HDL Ratio: 4
Triglycerides: 87 mg/dL (ref 0.0–149.0)
VLDL: 17.4 mg/dL (ref 0.0–40.0)

## 2022-05-22 NOTE — Assessment & Plan Note (Signed)
Chronic Has not slept well since her husband died Improved Continue Lunesta 3 mg nightly as needed

## 2022-05-22 NOTE — Assessment & Plan Note (Signed)
Chronic °Regular exercise and healthy diet encouraged °Check lipid panel  °Continue atorvastatin 20 mg daily °

## 2022-05-22 NOTE — Assessment & Plan Note (Signed)
Chronic Check a1c Low sugar / carb diet Stressed regular exercise  

## 2022-05-22 NOTE — Assessment & Plan Note (Signed)
Chronic Blood pressure well controlled CMP Continue amlodipine 10 mg daily, atenolol 25 mg daily 

## 2022-05-22 NOTE — Assessment & Plan Note (Signed)
Chronic Controlled, Stable Continue sertraline 100 mg daily 

## 2022-06-15 ENCOUNTER — Other Ambulatory Visit: Payer: Self-pay | Admitting: Internal Medicine

## 2022-06-25 ENCOUNTER — Other Ambulatory Visit: Payer: Self-pay | Admitting: Internal Medicine

## 2022-06-27 ENCOUNTER — Inpatient Hospital Stay: Admission: RE | Admit: 2022-06-27 | Payer: Medicare Other | Source: Ambulatory Visit

## 2022-06-27 ENCOUNTER — Ambulatory Visit: Payer: Medicare Other

## 2022-07-22 ENCOUNTER — Encounter: Payer: Self-pay | Admitting: Internal Medicine

## 2022-07-22 NOTE — Patient Instructions (Addendum)
      Call and schedule your mammogram  and bone density- [facility] Schedule an appointment by calling [phone]    Blood work was ordered.   The lab is on the first floor.    Medications changes include :       A referral was ordered for XXX.     Someone will call you to schedule an appointment.    Return in about 6 months (around 01/21/2023) for Physical Exam.

## 2022-07-22 NOTE — Progress Notes (Unsigned)
Subjective:    Patient ID: Brooke [name], female    DOB: 1950-10-05, 72 y.o.   MRN: [id number]     HPI Astasia is here for follow up of her chronic medical problems, including htn, hld, prediabetes, depression, sleep difficulties  Overall doing well  Palpable knot in left upper chest and right upper chest.  No pain.  She just noticed it recently.   She is cooking a little bit.  She finds it difficult to cook for one.    Medications and allergies reviewed with patient and updated if appropriate.  Current Outpatient Medications on File Prior to Visit  Medication Sig Dispense Refill   amLODipine (NORVASC) 10 MG tablet Take 1 tablet by mouth once daily 90 tablet 0   aspirin 81 MG chewable tablet Chew 81 mg by mouth daily.     atenolol (TENORMIN) 25 MG tablet Take 1 tablet by mouth once daily 90 tablet 0   atorvastatin (LIPITOR) 20 MG tablet Take 1 tablet by mouth once daily 90 tablet 0   dicyclomine (BENTYL) 10 MG capsule TAKE 1 CAPSULE BY MOUTH ONCE DAILY WITH BREAKFAST 30 capsule 2   Eszopiclone 3 MG TABS TAKE 1 TABLET BY MOUTH IMMEDIATELY BEFORE BEDTIME 30 tablet 0   omeprazole (PRILOSEC) 20 MG capsule Take 1 capsule by mouth once daily 30 capsule 0   potassium chloride (KLOR-CON) 10 MEQ tablet TAKE 1 TABLET BY MOUTH THREE TIMES DAILY 270 tablet 1   sertraline (ZOLOFT) 100 MG tablet Take 1 tablet by mouth once daily 90 tablet 0   [DISCONTINUED] potassium chloride (KLOR-CON M10) 10 MEQ tablet Take 1 tablet (10 mEq total) by mouth three times daily. 90 tablet 0   No current facility-administered medications on file prior to visit.     Review of Systems  Constitutional:  Negative for chills and fever.  Respiratory:  Negative for cough, shortness of breath and wheezing.   Cardiovascular:  Negative for chest pain, palpitations and leg swelling.  Gastrointestinal:  Negative for abdominal pain, constipation and diarrhea.       No gerd  Neurological:  Negative for  light-headedness and headaches.       Objective:   Vitals:   07/23/22 1033  BP: 114/80  Pulse: 65  Temp: 98 F (36.7 C)  SpO2: 98%   BP Readings from Last 3 Encounters:  07/23/22 114/80  05/22/22 112/60  02/20/22 122/78   Wt Readings from Last 3 Encounters:  07/23/22 155 lb (70.3 kg)  05/22/22 149 lb (67.6 kg)  02/20/22 150 lb (68 kg)   Body mass index is 29.29 kg/m.    Physical Exam Constitutional:      General: She is not in acute distress.    Appearance: Normal appearance.  HENT:     Head: Normocephalic and atraumatic.  Eyes:     Conjunctiva/sclera: Conjunctivae normal.  Cardiovascular:     Rate and Rhythm: Normal rate and regular rhythm.     Heart sounds: Normal heart sounds. No murmur heard. Pulmonary:     Effort: Pulmonary effort is normal. No respiratory distress.     Breath sounds: Normal breath sounds. No wheezing.  Musculoskeletal:     Cervical back: Neck supple.     Right lower leg: No edema.     Left lower leg: No edema.  Lymphadenopathy:     Cervical: No cervical adenopathy.  Skin:    General: Skin is warm and dry.     Findings: No rash.  Comments: Mobile mass left upper and right upper chest that is nontender and soft feeling-likely lipomas  Neurological:     Mental Status: She is alert. Mental status is at baseline.  Psychiatric:        Mood and Affect: Mood normal.        Behavior: Behavior normal.        Lab Results  Component Value Date   WBC 8.7 11/20/2021   HGB 12.4 11/20/2021   HCT 38.7 11/20/2021   PLT 211.0 11/20/2021   GLUCOSE 100 (H) 05/22/2022   CHOL 260 (H) 05/22/2022   TRIG 87.0 05/22/2022   HDL 73.80 05/22/2022   LDLDIRECT 162.0 08/05/2012   LDLCALC 169 (H) 05/22/2022   ALT 14 05/22/2022   AST 19 05/22/2022   NA 141 05/22/2022   K 4.1 05/22/2022   CL 106 05/22/2022   CREATININE 0.98 05/22/2022   BUN 12 05/22/2022   CO2 30 05/22/2022   TSH 1.79 08/06/2021   INR 1.9 (H) 04/15/2008   HGBA1C 6.1 05/22/2022    MICROALBUR 24.2 (H) 03/19/2009     Assessment & Plan:    See Problem List for Assessment and Plan of chronic medical problems.

## 2022-07-23 ENCOUNTER — Ambulatory Visit (INDEPENDENT_AMBULATORY_CARE_PROVIDER_SITE_OTHER): Payer: Medicare Other | Admitting: Internal Medicine

## 2022-07-23 VITALS — BP 114/80 | HR 65 | Temp 98.0°F | Ht 61.0 in | Wt 155.0 lb

## 2022-07-23 DIAGNOSIS — G479 Sleep disorder, unspecified: Secondary | ICD-10-CM

## 2022-07-23 DIAGNOSIS — F329 Major depressive disorder, single episode, unspecified: Secondary | ICD-10-CM

## 2022-07-23 DIAGNOSIS — E7849 Other hyperlipidemia: Secondary | ICD-10-CM

## 2022-07-23 DIAGNOSIS — R7303 Prediabetes: Secondary | ICD-10-CM

## 2022-07-23 DIAGNOSIS — I10 Essential (primary) hypertension: Secondary | ICD-10-CM

## 2022-07-23 DIAGNOSIS — D171 Benign lipomatous neoplasm of skin and subcutaneous tissue of trunk: Secondary | ICD-10-CM | POA: Diagnosis not present

## 2022-07-23 HISTORY — DX: Benign lipomatous neoplasm of skin and subcutaneous tissue of trunk: D17.1

## 2022-07-23 NOTE — Assessment & Plan Note (Addendum)
Chronic Still having sleep issues  - difficulty falling asleep Doing ok during day Does not nap Encouraged regular exercise Getting adequate sleep send no other treatment is necessary

## 2022-07-23 NOTE — Assessment & Plan Note (Signed)
New She has a likely lipoma in the left upper chest and right upper chest-mobile, nontender Reassured She is not up-to-date with her mammogram so we will have her schedule her mammogram which will be reassuring to make sure it is not in the breast, but this does feel like it is in the skin layer She will monitor it and let me know if it grows or if anything changes

## 2022-07-23 NOTE — Assessment & Plan Note (Signed)
Chronic °Regular exercise and healthy diet encouraged °Check lipid panel  °Continue atorvastatin 20 mg daily °

## 2022-07-23 NOTE — Assessment & Plan Note (Signed)
Chronic Check a1c Low sugar / carb diet Stressed regular exercise  

## 2022-07-23 NOTE — Assessment & Plan Note (Signed)
Chronic Controlled, Stable Continue sertraline 100 mg daily

## 2022-07-23 NOTE — Assessment & Plan Note (Signed)
Chronic Blood pressure well controlled CMP Continue amlodipine 10 mg daily, atenolol 25 mg daily

## 2022-07-29 LAB — COMPREHENSIVE METABOLIC PANEL
ALT: 23 U/L (ref 0–35)
AST: 25 U/L (ref 0–37)
Albumin: 3.7 g/dL (ref 3.5–5.2)
Alkaline Phosphatase: 85 U/L (ref 39–117)
BUN: 19 mg/dL (ref 6–23)
CO2: 28 mEq/L (ref 19–32)
Calcium: 10.1 mg/dL (ref 8.4–10.5)
Chloride: 104 mEq/L (ref 96–112)
Creatinine, Ser: 0.94 mg/dL (ref 0.40–1.20)
GFR: 60.29 mL/min (ref >60.00)
Glucose, Bld: 110 mg/dL — ABNORMAL HIGH (ref 70–99)
Potassium: 3.9 mEq/L (ref 3.5–5.1)
Sodium: 139 mEq/L (ref 135–145)
Total Bilirubin: 0.5 mg/dL (ref 0.2–1.2)
Total Protein: 6.9 g/dL (ref 6.0–8.3)

## 2022-07-29 LAB — LIPID PANEL
Cholesterol: 248 mg/dL — ABNORMAL HIGH (ref 0–200)
HDL: 69.9 mg/dL (ref >39.00)
LDL Cholesterol: 151 mg/dL — ABNORMAL HIGH (ref 0–99)
NonHDL: 177.72
Total CHOL/HDL Ratio: 4
Triglycerides: 134 mg/dL (ref 0.0–149.0)
VLDL: 26.8 mg/dL (ref 0.0–40.0)

## 2022-07-29 LAB — HEMOGLOBIN A1C: Hgb A1c MFr Bld: 5.6 % (ref 4.6–6.5)

## 2022-07-31 ENCOUNTER — Other Ambulatory Visit: Payer: Self-pay | Admitting: Internal Medicine

## 2022-07-31 MED ORDER — ATORVASTATIN CALCIUM 20 MG PO TABS: 20.0000 mg | ORAL_TABLET | Freq: Every day | ORAL | 3 refills | Status: DC

## 2022-08-08 ENCOUNTER — Telehealth: Payer: Self-pay | Admitting: Internal Medicine

## 2022-08-08 NOTE — Telephone Encounter (Signed)
Spoke with patient today. 

## 2022-08-08 NOTE — Telephone Encounter (Signed)
Patient said she had an ultrasound scheduled, but they canceled. She wanted to inform Dr. Quay Burow and see if she needed her to do anything. Best callback number is [phone].

## 2022-08-08 NOTE — Telephone Encounter (Signed)
Spoke with patient again today.

## 2022-08-08 NOTE — Telephone Encounter (Signed)
Pt has asked that Dr. Eilleen Kempf nurse give her a call back... would not specify reason other then it's in regards to an apptmnt.  Callback number is [phone]

## 2022-08-22 ENCOUNTER — Other Ambulatory Visit: Payer: Self-pay | Admitting: Internal Medicine

## 2022-08-22 DIAGNOSIS — Z13820 Encounter for screening for osteoporosis: Secondary | ICD-10-CM

## 2022-08-25 ENCOUNTER — Ambulatory Visit
Admission: RE | Admit: 2022-08-25 | Discharge: 2022-08-25 | Disposition: A | Discharge status: Home / Self Care | Payer: Medicare Other | Source: Ambulatory Visit | Attending: Internal Medicine | Admitting: Internal Medicine

## 2022-08-25 ENCOUNTER — Encounter: Payer: Self-pay | Admitting: Internal Medicine

## 2022-08-25 DIAGNOSIS — M8580 Other specified disorders of bone density and structure, unspecified site: Secondary | ICD-10-CM

## 2022-08-25 DIAGNOSIS — M8589 Other specified disorders of bone density and structure, multiple sites: Secondary | ICD-10-CM | POA: Diagnosis not present

## 2022-08-25 DIAGNOSIS — Z13820 Encounter for screening for osteoporosis: Secondary | ICD-10-CM

## 2022-08-25 DIAGNOSIS — Z780 Asymptomatic menopausal state: Secondary | ICD-10-CM | POA: Diagnosis not present

## 2022-08-25 HISTORY — DX: Other specified disorders of bone density and structure, unspecified site: M85.80

## 2022-08-26 ENCOUNTER — Telehealth: Payer: Self-pay | Admitting: Internal Medicine

## 2022-08-26 NOTE — Telephone Encounter (Signed)
Spoke with patient today and all questions answered about her upcoming appointments.

## 2022-08-26 NOTE — Telephone Encounter (Signed)
Pt called wanting to speak with a nurse about an upcoming appt . I was not sure what she was asking but she really want to talk to the nurse. Can someone please call pt.   Best call back # [phone]

## 2022-08-28 ENCOUNTER — Other Ambulatory Visit: Payer: Self-pay | Admitting: Nurse Practitioner

## 2022-08-28 NOTE — Telephone Encounter (Signed)
Please advise 

## 2022-08-29 ENCOUNTER — Inpatient Hospital Stay: Admission: RE | Admit: 2022-08-29 | Payer: Medicare Other | Source: Ambulatory Visit

## 2022-08-29 ENCOUNTER — Other Ambulatory Visit: Payer: Medicare Other

## 2022-09-07 ENCOUNTER — Other Ambulatory Visit: Payer: Self-pay | Admitting: Nurse Practitioner

## 2022-09-09 ENCOUNTER — Other Ambulatory Visit: Payer: Self-pay | Admitting: Internal Medicine

## 2022-09-14 ENCOUNTER — Encounter: Payer: Self-pay | Admitting: Internal Medicine

## 2022-09-14 NOTE — Progress Notes (Unsigned)
    Subjective:    Patient ID: Brooke [name], female    DOB: Aug 25, 1950, 72 y.o.   MRN: [id number]      HPI Lyasia is here for No chief complaint on file.    Stomach problems -     Medications and allergies reviewed with patient and updated if appropriate.  Current Outpatient Medications on File Prior to Visit  Medication Sig Dispense Refill   amLODipine (NORVASC) 10 MG tablet Take 1 tablet by mouth once daily 90 tablet 0   aspirin 81 MG chewable tablet Chew 81 mg by mouth daily.     atenolol (TENORMIN) 25 MG tablet Take 1 tablet by mouth once daily 90 tablet 0   atorvastatin (LIPITOR) 20 MG tablet Take 1 tablet (20 mg total) by mouth daily. 90 tablet 3   omeprazole (PRILOSEC) 20 MG capsule Take 1 capsule by mouth once daily 30 capsule 0   sertraline (ZOLOFT) 100 MG tablet Take 1 tablet by mouth once daily 90 tablet 0   [DISCONTINUED] potassium chloride (KLOR-CON M10) 10 MEQ tablet Take 1 tablet (10 mEq total) by mouth three times daily. 90 tablet 0   No current facility-administered medications on file prior to visit.    Review of Systems     Objective:  There were no vitals filed for this visit. BP Readings from Last 3 Encounters:  07/23/22 114/80  05/22/22 112/60  02/20/22 122/78   Wt Readings from Last 3 Encounters:  07/23/22 155 lb (70.3 kg)  05/22/22 149 lb (67.6 kg)  02/20/22 150 lb (68 kg)   There is no height or weight on file to calculate BMI.    Physical Exam         Assessment & Plan:    See Problem List for Assessment and Plan of chronic medical problems.

## 2022-09-15 ENCOUNTER — Encounter: Payer: Self-pay | Admitting: Internal Medicine

## 2022-09-15 ENCOUNTER — Ambulatory Visit (INDEPENDENT_AMBULATORY_CARE_PROVIDER_SITE_OTHER): Payer: Medicare Other | Admitting: Internal Medicine

## 2022-09-15 VITALS — BP 124/80 | HR 56 | Temp 98.6°F | Ht 61.0 in | Wt 157.0 lb

## 2022-09-15 DIAGNOSIS — R197 Diarrhea, unspecified: Secondary | ICD-10-CM | POA: Insufficient documentation

## 2022-09-15 DIAGNOSIS — K591 Functional diarrhea: Secondary | ICD-10-CM

## 2022-09-15 DIAGNOSIS — I10 Essential (primary) hypertension: Secondary | ICD-10-CM | POA: Diagnosis not present

## 2022-09-15 NOTE — Assessment & Plan Note (Signed)
Chronic Blood pressure well controlled Continue amlodipine 10 mg daily, atenolol 25 mg daily

## 2022-09-15 NOTE — Patient Instructions (Addendum)
       Medications changes include :   none.  You can take pepto-bismol or imodium as needed.       Return if symptoms worsen or fail to improve.

## 2022-09-15 NOTE — Assessment & Plan Note (Signed)
Acute Has had intermittent diarrhea for the past couple months-symptoms have improved Currently and having diarrhea 1-2 times per week-1 episode at a time. Has some associated cramping that resolves after the bowel movement.  No other symptoms No pattern to when this occurs or associated food Likely functional diarrhea No symptoms consistent with an infection At this point would just recommend symptomatic treatment with Pepto-Bismol or Imodium as needed-stressed not to overdo this and cause diarrhea She will pay attention to different foods she is eating to see if there is any pattern to why she is having this Call or return if symptoms or not improving or resolving

## 2022-09-17 ENCOUNTER — Other Ambulatory Visit: Payer: Self-pay | Admitting: Internal Medicine

## 2022-09-21 ENCOUNTER — Other Ambulatory Visit: Payer: Self-pay | Admitting: Internal Medicine

## 2022-09-22 ENCOUNTER — Other Ambulatory Visit: Payer: Self-pay

## 2022-09-26 ENCOUNTER — Telehealth: Payer: Self-pay

## 2022-09-26 NOTE — Telephone Encounter (Signed)
Pt states she having an issue having very dark stools ( very dark Brown) no evidence of blood that she can see. Pt denies nausea and vomiting. Pt states she has a decrease in her appetite. Pt states she would like some meds sent in like last time when she was having this same problem. Pt states she has had 2 bowel movements with the dark stools on yesterday, and this morning.

## 2022-09-26 NOTE — Telephone Encounter (Signed)
Schedule her for next week.  She had dark stools in the fall but there was no evidence of bleeding.  Stools could be dark for several reasons so dark brown is not concerning.  Stools may have changed related to something she ate.

## 2022-09-26 NOTE — Telephone Encounter (Signed)
Appointment scheduled.

## 2022-09-29 NOTE — Progress Notes (Unsigned)
    Subjective:    Patient ID: Brooke [name], female    DOB: 17-Aug-1950, 72 y.o.   MRN: [id number]      HPI Ta is here for No chief complaint on file.    Dark stools -     POCT occult blood stool  02/20/2022: negative  EGD 08/09/21: esophagus normal.  Diffuse atrophic mucosa in entire stomach.  Multiple biopsies taken.  Stomach otherwise normal. Duodenum normal.  Was positive for h pylori.    Last colonoscopy 10/10/19: one diminutive polyp in proximal transverse colon, otherwise normal.  Medications and allergies reviewed with patient and updated if appropriate.  Current Outpatient Medications on File Prior to Visit  Medication Sig Dispense Refill   amLODipine (NORVASC) 10 MG tablet Take 1 tablet by mouth once daily 90 tablet 0   aspirin 81 MG chewable tablet Chew 81 mg by mouth daily.     atenolol (TENORMIN) 25 MG tablet Take 1 tablet by mouth once daily 90 tablet 0   atorvastatin (LIPITOR) 20 MG tablet Take 1 tablet (20 mg total) by mouth daily. 90 tablet 3   dicyclomine (BENTYL) 10 MG capsule TAKE 1 CAPSULE BY MOUTH ONCE DAILY WITH BREAKFAST 30 capsule 0   omeprazole (PRILOSEC) 20 MG capsule Take 1 capsule by mouth once daily 30 capsule 0   sertraline (ZOLOFT) 100 MG tablet Take 1 tablet by mouth once daily 90 tablet 0   [DISCONTINUED] potassium chloride (KLOR-CON M10) 10 MEQ tablet Take 1 tablet (10 mEq total) by mouth three times daily. 90 tablet 0   No current facility-administered medications on file prior to visit.    Review of Systems     Objective:  There were no vitals filed for this visit. BP Readings from Last 3 Encounters:  09/15/22 124/80  07/23/22 114/80  05/22/22 112/60   Wt Readings from Last 3 Encounters:  09/15/22 157 lb (71.2 kg)  07/23/22 155 lb (70.3 kg)  05/22/22 149 lb (67.6 kg)   There is no height or weight on file to calculate BMI.    Physical Exam         Assessment & Plan:    See Problem List for Assessment and Plan  of chronic medical problems.

## 2022-09-30 ENCOUNTER — Encounter: Payer: Self-pay | Admitting: Internal Medicine

## 2022-09-30 ENCOUNTER — Ambulatory Visit (INDEPENDENT_AMBULATORY_CARE_PROVIDER_SITE_OTHER): Payer: Medicare Other | Admitting: Internal Medicine

## 2022-09-30 VITALS — BP 122/78 | HR 72 | Temp 97.9°F | Ht 61.0 in | Wt 156.0 lb

## 2022-09-30 DIAGNOSIS — I10 Essential (primary) hypertension: Secondary | ICD-10-CM

## 2022-09-30 DIAGNOSIS — R195 Other fecal abnormalities: Secondary | ICD-10-CM | POA: Diagnosis not present

## 2022-09-30 NOTE — Patient Instructions (Addendum)
     Monitor your stools.  Let us know if you have any concerns.

## 2022-09-30 NOTE — Assessment & Plan Note (Signed)
New Has noticed dark stools - stools are brown in color - no black stool Stool color varies - light - dark brown No blood, abdominal pain, GERD, nausea, weight is stable No evidence of bleeding  Last colonoscopy 2021, EGD 2023 reviewed, hemoccult stool neg 02/2022 Stool sounds like normal variation - discuss foods that can vary color of stool She will just monitor for now - will call with concerns.

## 2022-09-30 NOTE — Assessment & Plan Note (Signed)
Chronic Blood pressure well controlled Continue amlodipine 10 mg daily, atenolol 25 mg daily 

## 2022-10-07 ENCOUNTER — Ambulatory Visit: Payer: Medicare Other

## 2022-10-08 ENCOUNTER — Ambulatory Visit
Admission: RE | Admit: 2022-10-08 | Discharge: 2022-10-08 | Disposition: A | Discharge status: Home / Self Care | Payer: Medicare Other | Source: Ambulatory Visit | Attending: Internal Medicine | Admitting: Internal Medicine

## 2022-10-08 DIAGNOSIS — Z1231 Encounter for screening mammogram for malignant neoplasm of breast: Secondary | ICD-10-CM

## 2022-10-10 ENCOUNTER — Other Ambulatory Visit: Payer: Self-pay | Admitting: Internal Medicine

## 2022-10-10 DIAGNOSIS — R928 Other abnormal and inconclusive findings on diagnostic imaging of breast: Secondary | ICD-10-CM

## 2022-10-19 ENCOUNTER — Other Ambulatory Visit: Payer: Self-pay | Admitting: Internal Medicine

## 2022-10-24 ENCOUNTER — Ambulatory Visit
Admission: RE | Admit: 2022-10-24 | Discharge: 2022-10-24 | Disposition: A | Discharge status: Home / Self Care | Payer: Medicare Other | Source: Ambulatory Visit | Attending: Internal Medicine | Admitting: Internal Medicine

## 2022-10-24 ENCOUNTER — Other Ambulatory Visit: Payer: Self-pay | Admitting: Internal Medicine

## 2022-10-24 DIAGNOSIS — R928 Other abnormal and inconclusive findings on diagnostic imaging of breast: Secondary | ICD-10-CM

## 2022-10-24 DIAGNOSIS — R921 Mammographic calcification found on diagnostic imaging of breast: Secondary | ICD-10-CM

## 2022-10-25 ENCOUNTER — Other Ambulatory Visit: Payer: Self-pay | Admitting: Internal Medicine

## 2022-10-27 ENCOUNTER — Telehealth: Payer: Self-pay | Admitting: Internal Medicine

## 2022-10-27 NOTE — Telephone Encounter (Signed)
Her mammogram shows a couple of areas of calcification which is abnormal.  Because it is abnormal a biopsy is recommended to rule out cancer.  It may be nothing, but further evaluation is needed.  I would recommend that she go ahead and do the biopsy.

## 2022-10-27 NOTE — Telephone Encounter (Signed)
Pt called wanted Dr. Lawerance Bach to go over her breast exam results with her. please advised

## 2022-10-28 NOTE — Telephone Encounter (Signed)
Spoke with patient today and results given. 

## 2022-11-04 ENCOUNTER — Telehealth: Payer: Self-pay | Admitting: Internal Medicine

## 2022-11-04 NOTE — Telephone Encounter (Signed)
Pt called wanting to speak with a nurse complaining she been having hard stool and wanting advice of what she need to do.

## 2022-11-05 NOTE — Telephone Encounter (Signed)
Called pt she states the stool is no longer hard. It has turned brown would like MD recommendation. She is asking is there anything that she can take over counter.Marland KitchenRaechel Chute

## 2022-11-05 NOTE — Telephone Encounter (Signed)
Please call patient for more information.  First note says that her stool was followed and she wanted advice regarding that and the second notes that that her stool is no longer hard-?

## 2022-11-05 NOTE — Telephone Encounter (Signed)
Attempted to reach patient but no answer and no VM set up.  If she calls back okay to transfer to me.

## 2022-11-06 ENCOUNTER — Other Ambulatory Visit: Payer: Self-pay | Admitting: Nurse Practitioner

## 2022-11-06 NOTE — Telephone Encounter (Signed)
Please advise 

## 2022-11-11 ENCOUNTER — Inpatient Hospital Stay: Admission: RE | Admit: 2022-11-11 | Payer: Medicare Other | Source: Ambulatory Visit

## 2022-11-11 ENCOUNTER — Encounter: Payer: Self-pay | Admitting: Internal Medicine

## 2022-11-11 NOTE — Patient Instructions (Addendum)
      Blood work was ordered.   The lab is on the first floor.    Medications changes include :   restart omeprazole 20 mg daily   See Gynecology    Return in about 6 months (around 05/14/2023) for Physical Exam.  

## 2022-11-11 NOTE — Progress Notes (Unsigned)
Subjective:    Patient ID: Brooke [name], female    DOB: 1950/10/10, 72 y.o.   MRN: [id number]     HPI Brooke [name] is here for follow up of her chronic medical problems.  Eating habits vary - sometimes does not want anything.  Not motivated to cook for just herself.  Bowels sometimes irregular - now ok. EGD 2023-normal Colonoscopy 2021-1 small polyp, otherwise normal.  Doing some exercises at home.  Medications and allergies reviewed with patient and updated if appropriate.  Current Outpatient Medications on File Prior to Visit  Medication Sig Dispense Refill   amLODipine (NORVASC) 10 MG tablet Take 1 tablet by mouth once daily 90 tablet 0   aspirin 81 MG chewable tablet Chew 81 mg by mouth daily.     atenolol (TENORMIN) 25 MG tablet Take 1 tablet by mouth once daily 90 tablet 0   atorvastatin (LIPITOR) 20 MG tablet Take 1 tablet (20 mg total) by mouth daily. 90 tablet 3   dicyclomine (BENTYL) 10 MG capsule TAKE 1 CAPSULE BY MOUTH ONCE DAILY WITH BREAKFAST 30 capsule 0   omeprazole (PRILOSEC) 20 MG capsule Take 1 capsule by mouth once daily 30 capsule 0   potassium chloride (KLOR-CON) 10 MEQ tablet TAKE 1 TABLET BY MOUTH THREE TIMES DAILY 270 tablet 1   sertraline (ZOLOFT) 100 MG tablet Take 1 tablet by mouth once daily 90 tablet 1   [DISCONTINUED] potassium chloride (KLOR-CON M10) 10 MEQ tablet Take 1 tablet (10 mEq total) by mouth three times daily. 90 tablet 0   No current facility-administered medications on file prior to visit.     Review of Systems  Constitutional:  Negative for fever.  Respiratory:  Negative for cough, shortness of breath and wheezing.   Cardiovascular:  Negative for chest pain, palpitations and leg swelling.  Gastrointestinal:  Positive for abdominal pain (occ). Negative for constipation, diarrhea and nausea.       No gerd  Neurological:  Negative for light-headedness and headaches.       Objective:   Vitals:   11/12/22 0856  BP:  126/72  Pulse: 60  Temp: 98 F (36.7 C)  SpO2: 98%   BP Readings from Last 3 Encounters:  11/12/22 126/72  09/30/22 122/78  09/15/22 124/80   Wt Readings from Last 3 Encounters:  11/12/22 158 lb 6.4 oz (71.8 kg)  09/30/22 156 lb (70.8 kg)  09/15/22 157 lb (71.2 kg)   Body mass index is 29.93 kg/m.    Physical Exam Constitutional:      General: She is not in acute distress.    Appearance: Normal appearance.  HENT:     Head: Normocephalic and atraumatic.  Eyes:     Conjunctiva/sclera: Conjunctivae normal.  Cardiovascular:     Rate and Rhythm: Normal rate and regular rhythm.     Heart sounds: Normal heart sounds.  Pulmonary:     Effort: Pulmonary effort is normal. No respiratory distress.     Breath sounds: Normal breath sounds. No wheezing.  Musculoskeletal:     Cervical back: Neck supple.     Right lower leg: No edema.     Left lower leg: No edema.  Lymphadenopathy:     Cervical: No cervical adenopathy.  Skin:    General: Skin is warm and dry.     Findings: No rash.  Neurological:     Mental Status: She is alert. Mental status is at baseline.  Psychiatric:  Mood and Affect: Mood normal.        Behavior: Behavior normal.        Lab Results  Component Value Date   WBC 8.7 11/20/2021   HGB 12.4 11/20/2021   HCT 38.7 11/20/2021   PLT 211.0 11/20/2021   GLUCOSE 110 (H) 07/29/2022   CHOL 248 (H) 07/29/2022   TRIG 134.0 07/29/2022   HDL 69.90 07/29/2022   LDLDIRECT 162.0 08/05/2012   LDLCALC 151 (H) 07/29/2022   ALT 23 07/29/2022   AST 25 07/29/2022   NA 139 07/29/2022   K 3.9 07/29/2022   CL 104 07/29/2022   CREATININE 0.94 07/29/2022   BUN 19 07/29/2022   CO2 28 07/29/2022   TSH 1.79 08/06/2021   INR 1.9 (H) 04/15/2008   HGBA1C 5.6 07/29/2022   MICROALBUR 24.2 (H) 03/19/2009     Assessment & Plan:    See Problem List for Assessment and Plan of chronic medical problems.

## 2022-11-12 ENCOUNTER — Ambulatory Visit (INDEPENDENT_AMBULATORY_CARE_PROVIDER_SITE_OTHER): Payer: Medicare Other | Admitting: Internal Medicine

## 2022-11-12 ENCOUNTER — Other Ambulatory Visit: Payer: Self-pay | Admitting: *Deleted

## 2022-11-12 VITALS — BP 126/72 | HR 60 | Temp 98.0°F | Ht 61.0 in | Wt 158.4 lb

## 2022-11-12 DIAGNOSIS — R7303 Prediabetes: Secondary | ICD-10-CM

## 2022-11-12 DIAGNOSIS — I10 Essential (primary) hypertension: Secondary | ICD-10-CM | POA: Diagnosis not present

## 2022-11-12 DIAGNOSIS — F329 Major depressive disorder, single episode, unspecified: Secondary | ICD-10-CM | POA: Diagnosis not present

## 2022-11-12 DIAGNOSIS — E7849 Other hyperlipidemia: Secondary | ICD-10-CM

## 2022-11-12 LAB — LIPID PANEL
Cholesterol: 164 mg/dL (ref 0–200)
HDL: 68.9 mg/dL (ref >39.00)
LDL Cholesterol: 79 mg/dL (ref 0–99)
NonHDL: 94.6
Total CHOL/HDL Ratio: 2
Triglycerides: 79 mg/dL (ref 0.0–149.0)
VLDL: 15.8 mg/dL (ref 0.0–40.0)

## 2022-11-12 LAB — COMPREHENSIVE METABOLIC PANEL
ALT: 21 U/L (ref 0–35)
AST: 23 U/L (ref 0–37)
Albumin: 3.8 g/dL (ref 3.5–5.2)
Alkaline Phosphatase: 79 U/L (ref 39–117)
BUN: 19 mg/dL (ref 6–23)
CO2: 23 mEq/L (ref 19–32)
Calcium: 9.9 mg/dL (ref 8.4–10.5)
Chloride: 105 mEq/L (ref 96–112)
Creatinine, Ser: 0.92 mg/dL (ref 0.40–1.20)
GFR: 61.74 mL/min (ref >60.00)
Glucose, Bld: 86 mg/dL (ref 70–99)
Potassium: 3.9 mEq/L (ref 3.5–5.1)
Sodium: 140 mEq/L (ref 135–145)
Total Bilirubin: 0.4 mg/dL (ref 0.2–1.2)
Total Protein: 6.8 g/dL (ref 6.0–8.3)

## 2022-11-12 LAB — HEMOGLOBIN A1C: Hgb A1c MFr Bld: 5.6 % (ref 4.6–6.5)

## 2022-11-12 NOTE — Assessment & Plan Note (Addendum)
Chronic Improved - Controlled Continue sertraline 100 mg daily

## 2022-11-12 NOTE — Assessment & Plan Note (Signed)
Chronic Check a1c Low sugar / carb diet Stressed regular exercise  

## 2022-11-12 NOTE — Assessment & Plan Note (Signed)
Chronic °Regular exercise and healthy diet encouraged °Check lipid panel  °Continue atorvastatin 20 mg daily °

## 2022-11-12 NOTE — Addendum Note (Signed)
Addended by: Pincus Sanes on: 11/12/2022 10:01 AM   Modules accepted: Orders

## 2022-11-12 NOTE — Assessment & Plan Note (Signed)
Chronic Blood pressure well controlled CMP, CBC Continue amlodipine 10 mg daily, atenolol 25 mg daily

## 2022-11-20 ENCOUNTER — Ambulatory Visit (INDEPENDENT_AMBULATORY_CARE_PROVIDER_SITE_OTHER): Payer: Medicare Other | Admitting: Nurse Practitioner

## 2022-11-20 VITALS — BP 122/78 | HR 60 | Temp 98.3°F | Ht 61.0 in | Wt 157.4 lb

## 2022-11-20 DIAGNOSIS — T63301A Toxic effect of unspecified spider venom, accidental (unintentional), initial encounter: Secondary | ICD-10-CM

## 2022-11-20 MED ORDER — DOXYCYCLINE HYCLATE 100 MG PO TABS
100.0000 mg | ORAL_TABLET | Freq: Two times a day (BID) | ORAL | 0 refills | Status: DC
Start: 2022-11-20 — End: 2022-11-26

## 2022-11-20 MED ORDER — TETANUS-DIPHTH-ACELL PERTUSSIS 5-2.5-18.5 LF-MCG/0.5 IM SUSP
0.5000 mL | Freq: Once | INTRAMUSCULAR | 0 refills | Status: AC
Start: 2022-11-20 — End: 2022-11-20

## 2022-11-20 NOTE — Progress Notes (Signed)
Established Patient Office Visit  Subjective   Patient ID: Brooke [name], female    DOB: 11/05/1950  Age: 72 y.o. MRN: [id number]  Chief Complaint  Patient presents with   Insect Bite    Not aware of what nite her, happened 2 days ago, On the right lower thigh, redness to the area and raised blister  Itchiness to it sometime    Patient woke up yesterday with a painless, vesicular rash located to her right medial thigh. Patient reports having been working in the yard and with leaves frequently. Rash is painless, no drainage. No burning, not itching. Surrounding skin is red and edematous. Believes this to be from an insect bite but did not ever identify what bit her. Rash has remained consistent in size and characteristics since first noted yesterday. Patient's last tetanus shot was in 2009.    ROS: see HPI    Objective:     BP 122/78   Pulse 60   Temp 98.3 F (36.8 C) (Temporal)   Ht 5\' 1"  (1.549 m)   Wt 157 lb 6 oz (71.4 kg)   SpO2 98%   BMI 29.74 kg/m    Physical Exam Vitals reviewed.  Constitutional:      General: She is not in acute distress.    Appearance: Normal appearance.  HENT:     Head: Normocephalic and atraumatic.  Neck:     Vascular: No carotid bruit.  Cardiovascular:     Rate and Rhythm: Normal rate and regular rhythm.     Pulses: Normal pulses.     Heart sounds: Normal heart sounds.  Pulmonary:     Effort: Pulmonary effort is normal.     Breath sounds: Normal breath sounds.  Skin:    General: Skin is warm and dry.       Neurological:     General: No focal deficit present.     Mental Status: She is alert and oriented to person, place, and time.  Psychiatric:        Mood and Affect: Mood normal.        Behavior: Behavior normal.        Judgment: Judgment normal.      No results found for any visits on 11/20/22.    The 10-year ASCVD risk score (Arnett DK, et al., 2019) is: 11.5%    Assessment & Plan:   Problem List Items  Addressed This Visit       Other   Spider bite - Primary    Acute, appears consistent with probable brown recluse bite.  She has no fever, no pain, VSS without brady/tachycardia, respiratory distress, no muscle cramping.  Lesion itself does have swelling and multiple vesicles that are present without drainage.  I recommend patient have tetanus shot administered, Tdap prescription sent to patient's pharmacy as office staff report that will not be covered by insurance to be administered here in this office.  Patient reports understanding and that she will proceed to pharmacy to have Tdap administered. To swelling and redness surrounding the vesicles will treat with course of doxycycline in case this is early cellulitis/infection.  Patient counseled on possible side effects and what to do these were to occur. Patient told to follow-up if symptoms persist or worsen.      Relevant Medications   doxycycline (VIBRA-TABS) 100 MG tablet   Tdap (BOOSTRIX) 5-2.5-18.5 LF-MCG/0.5 injection    Return if symptoms worsen or fail to improve.    Elenore Paddy, NP

## 2022-11-20 NOTE — Assessment & Plan Note (Signed)
Acute, appears consistent with probable brown recluse bite.  She has no fever, no pain, VSS without brady/tachycardia, respiratory distress, no muscle cramping.  Lesion itself does have swelling and multiple vesicles that are present without drainage.  I recommend patient have tetanus shot administered, Tdap prescription sent to patient's pharmacy as office staff report that will not be covered by insurance to be administered here in this office.  Patient reports understanding and that she will proceed to pharmacy to have Tdap administered. To swelling and redness surrounding the vesicles will treat with course of doxycycline in case this is early cellulitis/infection.  Patient counseled on possible side effects and what to do these were to occur. Patient told to follow-up if symptoms persist or worsen.

## 2022-11-20 NOTE — Patient Instructions (Addendum)
Lesion on your thigh I appears consistent with probable spider bite.   Have tetanus shot administered at your pharmacy, I have sent prescription to the [facility] on [address] for this vaccine.  You can also just request this from the pharmacist as they can administer without a prescription.  We will treat with course of antibiotics called doxycycline.  You will take 1 tablet by mouth in the morning and 1 tablet by mouth in the evening for a total of 10 days.  Sometimes antibiotics can lead to abdominal discomfort and diarrhea, consider eating Actvia yogurt or taking an over the counter probiotic to help prevent/treat this.  If you choose to eat Activia yogurt or take a probiotic please separate this from the doxycycline by at least 2 hours.  Doxycycline can lead to increased risk for severe sunburn.  Try to avoid direct sunlight for prolonged periods of time while taking the doxycycline.  If you do go out in the sun and feel like you are burning, get out of the sun.  Please call if the lesion becomes painful, gets bigger, starts to have pussy drainage, or you spike a fever.

## 2022-11-25 ENCOUNTER — Encounter: Payer: Self-pay | Admitting: Internal Medicine

## 2022-11-25 NOTE — Patient Instructions (Addendum)
Consider getting a tetanus and shingles vaccine at your pharmacy.     Medications changes include :   None      Return in about 6 months (around 05/28/2023) for follow up.    Health Maintenance, Female Adopting a healthy lifestyle and getting preventive care are important in promoting health and wellness. Ask your health care provider about: The right schedule for you to have regular tests and exams. Things you can do on your own to prevent diseases and keep yourself healthy. What should I know about diet, weight, and exercise? Eat a healthy diet  Eat a diet that includes plenty of vegetables, fruits, low-fat dairy products, and lean protein. Do not eat a lot of foods that are high in solid fats, added sugars, or sodium. Maintain a healthy weight Body mass index (BMI) is used to identify weight problems. It estimates body fat based on height and weight. Your health care provider can help determine your BMI and help you achieve or maintain a healthy weight. Get regular exercise Get regular exercise. This is one of the most important things you can do for your health. Most adults should: Exercise for at least 150 minutes each week. The exercise should increase your heart rate and make you sweat (moderate-intensity exercise). Do strengthening exercises at least twice a week. This is in addition to the moderate-intensity exercise. Spend less time sitting. Even light physical activity can be beneficial. Watch cholesterol and blood lipids Have your blood tested for lipids and cholesterol at 74 years of age, then have this test every 5 years. Have your cholesterol levels checked more often if: Your lipid or cholesterol levels are high. You are older than 74 years of age. You are at high risk for heart disease. What should I know about cancer screening? Depending on your health history and family history, you may need to have cancer screening at various ages. This may include  screening for: Breast cancer. Cervical cancer. Colorectal cancer. Skin cancer. Lung cancer. What should I know about heart disease, diabetes, and high blood pressure? Blood pressure and heart disease High blood pressure causes heart disease and increases the risk of stroke. This is more likely to develop in people who have high blood pressure readings or are overweight. Have your blood pressure checked: Every 3-5 years if you are 51-34 years of age. Every year if you are 89 years old or older. Diabetes Have regular diabetes screenings. This checks your fasting blood sugar level. Have the screening done: Once every three years after age 101 if you are at a normal weight and have a low risk for diabetes. More often and at a younger age if you are overweight or have a high risk for diabetes. What should I know about preventing infection? Hepatitis B If you have a higher risk for hepatitis B, you should be screened for this virus. Talk with your health care provider to find out if you are at risk for hepatitis B infection. Hepatitis C Testing is recommended for: Everyone born from [birth year] through 1967. Anyone with known risk factors for hepatitis C. Sexually transmitted infections (STIs) Get screened for STIs, including gonorrhea and chlamydia, if: You are sexually active and are younger than 74 years of age. You are older than 74 years of age and your health care provider tells you that you are at risk for this type of infection. Your sexual activity has changed since you were last screened, and you are at increased risk  for chlamydia or gonorrhea. Ask your health care provider if you are at risk. Ask your health care provider about whether you are at high risk for HIV. Your health care provider may recommend a prescription medicine to help prevent HIV infection. If you choose to take medicine to prevent HIV, you should first get tested for HIV. You should then be tested every 3 months for as  long as you are taking the medicine. Pregnancy If you are about to stop having your period (premenopausal) and you may become pregnant, seek counseling before you get pregnant. Take 400 to 800 micrograms (mcg) of folic acid every day if you become pregnant. Ask for birth control (contraception) if you want to prevent pregnancy. Osteoporosis and menopause Osteoporosis is a disease in which the bones lose minerals and strength with aging. This can result in bone fractures. If you are 55 years old or older, or if you are at risk for osteoporosis and fractures, ask your health care provider if you should: Be screened for bone loss. Take a calcium or vitamin D supplement to lower your risk of fractures. Be given hormone replacement therapy (HRT) to treat symptoms of menopause. Follow these instructions at home: Alcohol use Do not drink alcohol if: Your health care provider tells you not to drink. You are pregnant, may be pregnant, or are planning to become pregnant. If you drink alcohol: Limit how much you have to: 0-1 drink a day. Know how much alcohol is in your drink. In the [place] one drink equals one 12 oz bottle of beer (355 mL), one 5 oz glass of wine (148 mL), or one 1 oz glass of hard liquor (44 mL). Lifestyle Do not use any products that contain nicotine or tobacco. These products include cigarettes, chewing tobacco, and vaping devices, such as e-cigarettes. If you need help quitting, ask your health care provider. Do not use street drugs. Do not share needles. Ask your health care provider for help if you need support or information about quitting drugs. General instructions Schedule regular health, dental, and eye exams. Stay current with your vaccines. Tell your health care provider if: You often feel depressed. You have ever been abused or do not feel safe at home. Summary Adopting a healthy lifestyle and getting preventive care are important in promoting health and  wellness. Follow your health care provider's instructions about healthy diet, exercising, and getting tested or screened for diseases. Follow your health care provider's instructions on monitoring your cholesterol and blood pressure. This information is not intended to replace advice given to you by your health care provider. Make sure you discuss any questions you have with your health care provider. Document Revised: 10/15/2020 Document Reviewed: 10/15/2020 Elsevier Patient Education  2024 [employer].

## 2022-11-25 NOTE — Progress Notes (Unsigned)
Subjective:    Patient ID: Brooke [name], female    DOB: [birth year], 72 y.o.   MRN: [id number]      HPI [name] is here for a Physical exam and her chronic medical problems.   Seen recently for a spider bite-antibiotics.  She states the area is better and has healed.  Feeling good overall and has no concerns.   Medications and allergies reviewed with patient and updated if appropriate.  Current Outpatient Medications on File Prior to Visit  Medication Sig Dispense Refill   amLODipine (NORVASC) 10 MG tablet Take 1 tablet by mouth once daily 90 tablet 0   aspirin 81 MG chewable tablet Chew 81 mg by mouth daily.     atenolol (TENORMIN) 25 MG tablet Take 1 tablet by mouth once daily 90 tablet 0   atorvastatin (LIPITOR) 20 MG tablet Take 1 tablet (20 mg total) by mouth daily. 90 tablet 3   dicyclomine (BENTYL) 10 MG capsule TAKE 1 CAPSULE BY MOUTH ONCE DAILY WITH BREAKFAST 30 capsule 0   omeprazole (PRILOSEC) 20 MG capsule Take 1 capsule by mouth once daily 30 capsule 0   potassium chloride (KLOR-CON) 10 MEQ tablet TAKE 1 TABLET BY MOUTH THREE TIMES DAILY 270 tablet 1   sertraline (ZOLOFT) 100 MG tablet Take 1 tablet by mouth once daily 90 tablet 1   [DISCONTINUED] potassium chloride (KLOR-CON M10) 10 MEQ tablet Take 1 tablet (10 mEq total) by mouth three times daily. 90 tablet 0   No current facility-administered medications on file prior to visit.    Review of Systems  Constitutional:  Negative for fever.  Eyes:  Negative for visual disturbance.  Respiratory:  Negative for cough, shortness of breath and wheezing.   Cardiovascular:  Negative for chest pain, palpitations and leg swelling.  Gastrointestinal:  Negative for abdominal pain, blood in stool, constipation and diarrhea.       No gerd  Genitourinary:  Negative for dysuria.  Musculoskeletal:  Negative for arthralgias and back pain.  Skin:  Negative for rash.  Neurological:  Negative for light-headedness and  headaches.  Psychiatric/Behavioral:  Negative for dysphoric mood. The patient is not nervous/anxious.        Objective:   Vitals:   11/26/22 0917  BP: 136/74  Pulse: (!) 59  Temp: 98.3 F (36.8 C)  SpO2: 98%   Filed Weights   11/26/22 0917  Weight: 157 lb 6 oz (71.4 kg)   Body mass index is 29.74 kg/m.  BP Readings from Last 3 Encounters:  11/26/22 136/74  11/20/22 122/78  11/12/22 126/72    Wt Readings from Last 3 Encounters:  11/26/22 157 lb 6 oz (71.4 kg)  11/20/22 157 lb 6 oz (71.4 kg)  11/12/22 158 lb 6.4 oz (71.8 kg)       Physical Exam Constitutional: She appears well-developed and well-nourished. No distress.  HENT:  Head: Normocephalic and atraumatic.  Right Ear: External ear normal. Normal ear canal and TM Left Ear: External ear normal.  Normal ear canal and TM Mouth/Throat: Oropharynx is clear and moist.  Eyes: Conjunctivae normal.  Neck: Neck supple. No tracheal deviation present. No thyromegaly present.  No carotid bruit  Cardiovascular: Normal rate, regular rhythm and normal heart sounds.   No murmur heard.  No edema. Pulmonary/Chest: Effort normal and breath sounds normal. No respiratory distress. She has no wheezes. She has no rales.  Breast: deferred   Abdominal: Soft. She exhibits no distension. There is no tenderness.  Lymphadenopathy:  She has no cervical adenopathy.  Skin: Skin is warm and dry. She is not diaphoretic.  Psychiatric: She has a normal mood and affect. Her behavior is normal.     Lab Results  Component Value Date   WBC 8.7 11/20/2021   HGB 12.4 11/20/2021   HCT 38.7 11/20/2021   PLT 211.0 11/20/2021   GLUCOSE 86 11/12/2022   CHOL 164 11/12/2022   TRIG 79.0 11/12/2022   HDL 68.90 11/12/2022   LDLDIRECT 162.0 08/05/2012   LDLCALC 79 11/12/2022   ALT 21 11/12/2022   AST 23 11/12/2022   NA 140 11/12/2022   K 3.9 11/12/2022   CL 105 11/12/2022   CREATININE 0.92 11/12/2022   BUN 19 11/12/2022   CO2 23 11/12/2022    TSH 1.79 08/06/2021   INR 1.9 (H) 04/15/2008   HGBA1C 5.6 11/12/2022   MICROALBUR 24.2 (H) 03/19/2009         Assessment & Plan:   Physical exam: Screening blood work  ordered Exercise some home exercises-does yard work [employer] overweight-okay for age Substance abuse  none   Reviewed recommended immunizations.   Health Maintenance  Topic Date Due   Zoster Vaccines- Shingrix (1 of 2) Never done   Medicare Annual Wellness (AWV)  08/21/2017   DTaP/Tdap/Td (3 - Tdap) 03/30/2018   COVID-19 Vaccine (4 - 2023-24 season) 02/07/2022   INFLUENZA VACCINE  01/08/2023   MAMMOGRAM  10/23/2024   DEXA SCAN  08/24/2025   Colonoscopy  10/09/2029   Pneumonia Vaccine 83+ Years old  Completed   Hepatitis C Screening  Completed   HPV VACCINES  Aged Out          See Problem List for Assessment and Plan of chronic medical problems.

## 2022-11-26 ENCOUNTER — Ambulatory Visit (INDEPENDENT_AMBULATORY_CARE_PROVIDER_SITE_OTHER): Payer: Medicare Other | Admitting: Internal Medicine

## 2022-11-26 VITALS — BP 136/74 | HR 59 | Temp 98.3°F | Ht 61.0 in | Wt 157.4 lb

## 2022-11-26 DIAGNOSIS — Z0000 Encounter for general adult medical examination without abnormal findings: Secondary | ICD-10-CM

## 2022-11-26 DIAGNOSIS — I10 Essential (primary) hypertension: Secondary | ICD-10-CM

## 2022-11-26 DIAGNOSIS — R7303 Prediabetes: Secondary | ICD-10-CM

## 2022-11-26 DIAGNOSIS — M8589 Other specified disorders of bone density and structure, multiple sites: Secondary | ICD-10-CM

## 2022-11-26 DIAGNOSIS — E7849 Other hyperlipidemia: Secondary | ICD-10-CM

## 2022-11-26 DIAGNOSIS — F329 Major depressive disorder, single episode, unspecified: Secondary | ICD-10-CM

## 2022-11-26 NOTE — Assessment & Plan Note (Signed)
Chronic Lipids controlled Encouraged healthy diet and regular exercise Continue atorvastatin 20 mg daily

## 2022-11-26 NOTE — Assessment & Plan Note (Signed)
Chronic A1c in normal range earlier this month Low sugar / carb diet Stressed regular exercise

## 2022-11-26 NOTE — Assessment & Plan Note (Signed)
Chronic Blood pressure well controlled Kidney function normal Continue amlodipine 10 mg daily, atenolol 25 mg daily

## 2022-11-26 NOTE — Assessment & Plan Note (Signed)
Chronic Improved - Controlled Continue sertraline 100 mg daily 

## 2022-11-26 NOTE — Assessment & Plan Note (Signed)
Chronic DEXA up-to-date Stressed regular exercise Continue calcium and vitamin D 

## 2022-12-04 ENCOUNTER — Other Ambulatory Visit: Payer: Self-pay | Admitting: Internal Medicine

## 2022-12-19 ENCOUNTER — Other Ambulatory Visit: Payer: Self-pay | Admitting: Internal Medicine

## 2023-01-18 ENCOUNTER — Encounter: Payer: Self-pay | Admitting: Internal Medicine

## 2023-01-18 NOTE — Progress Notes (Unsigned)
    Subjective:    Patient ID: Brooke [name], female    DOB: Nov 27, 1950, 72 y.o.   MRN: [id number]      HPI Brooke [name] is here for No chief complaint on file.        Medications and allergies reviewed with patient and updated if appropriate.  Current Outpatient Medications on File Prior to Visit  Medication Sig Dispense Refill   amLODipine (NORVASC) 10 MG tablet Take 1 tablet by mouth once daily 90 tablet 0   aspirin 81 MG chewable tablet Chew 81 mg by mouth daily.     atenolol (TENORMIN) 25 MG tablet Take 1 tablet by mouth once daily 90 tablet 0   atorvastatin (LIPITOR) 20 MG tablet Take 1 tablet (20 mg total) by mouth daily. 90 tablet 3   dicyclomine (BENTYL) 10 MG capsule TAKE 1 CAPSULE BY MOUTH ONCE DAILY WITH BREAKFAST 30 capsule 0   omeprazole (PRILOSEC) 20 MG capsule Take 1 capsule by mouth once daily 30 capsule 0   potassium chloride (KLOR-CON) 10 MEQ tablet TAKE 1 TABLET BY MOUTH THREE TIMES DAILY 270 tablet 1   sertraline (ZOLOFT) 100 MG tablet Take 1 tablet by mouth once daily 90 tablet 1   [DISCONTINUED] potassium chloride (KLOR-CON M10) 10 MEQ tablet Take 1 tablet (10 mEq total) by mouth three times daily. 90 tablet 0   No current facility-administered medications on file prior to visit.    Review of Systems     Objective:  There were no vitals filed for this visit. BP Readings from Last 3 Encounters:  11/26/22 136/74  11/20/22 122/78  11/12/22 126/72   Wt Readings from Last 3 Encounters:  11/26/22 157 lb 6 oz (71.4 kg)  11/20/22 157 lb 6 oz (71.4 kg)  11/12/22 158 lb 6.4 oz (71.8 kg)   There is no height or weight on file to calculate BMI.    Physical Exam         Assessment & Plan:    See Problem List for Assessment and Plan of chronic medical problems.

## 2023-01-19 ENCOUNTER — Ambulatory Visit (INDEPENDENT_AMBULATORY_CARE_PROVIDER_SITE_OTHER): Payer: Medicare Other | Admitting: Internal Medicine

## 2023-01-19 ENCOUNTER — Encounter: Payer: Self-pay | Admitting: Internal Medicine

## 2023-01-19 VITALS — BP 126/74 | HR 64 | Temp 97.4°F | Ht 61.0 in | Wt 157.0 lb

## 2023-01-19 DIAGNOSIS — K219 Gastro-esophageal reflux disease without esophagitis: Secondary | ICD-10-CM

## 2023-01-19 DIAGNOSIS — R413 Other amnesia: Secondary | ICD-10-CM | POA: Diagnosis not present

## 2023-01-19 DIAGNOSIS — I10 Essential (primary) hypertension: Secondary | ICD-10-CM

## 2023-01-19 MED ORDER — FAMOTIDINE 40 MG PO TABS: 40.0000 mg | ORAL_TABLET | Freq: Every day | ORAL | 5 refills | Status: DC | PRN

## 2023-01-19 NOTE — Assessment & Plan Note (Signed)
Chronic Taking omeprazole 20 mg daily as needed Advised stopping this-start Pepcid 40 mg daily as needed Discussed lifestyle changes to avoid GERD and to only take medication as needed

## 2023-01-19 NOTE — Assessment & Plan Note (Signed)
Chronic Blood pressure well controlled Continue amlodipine 10 mg daily, atenolol 25 mg daily 

## 2023-01-19 NOTE — Patient Instructions (Addendum)
    Medications changes include :   stop omeprazole and start pepcid ( famotidine 20 mg) as needed only    A referral was ordered [facility] and someone will call you to schedule an appointment.     Return in about 4 months (around 05/21/2023) for follow up.

## 2023-01-19 NOTE — Assessment & Plan Note (Signed)
She has mentioned some concerns regarding her memory in the past, but now feels her memory is probably within normal limits and she does not let it bother her She has 2 sons and both are concerned about her memory-forgetting things that they told her while losing things She agrees to have further evaluation-referral to neurology B12 and TSH normal in the past-will recheck with her next routine visit Stressed regular exercise Stressed with sleep Stressed making sure her depression and anxiety are controlled which she feels they are Stressed healthy diet Encouraged more socialization, medical exercises

## 2023-01-21 ENCOUNTER — Encounter: Payer: Medicare Other | Admitting: Internal Medicine

## 2023-01-23 ENCOUNTER — Encounter: Payer: Self-pay | Admitting: Physician Assistant

## 2023-02-11 ENCOUNTER — Encounter: Payer: Self-pay | Admitting: Physician Assistant

## 2023-02-11 ENCOUNTER — Ambulatory Visit: Payer: Medicare Other | Admitting: Physician Assistant

## 2023-02-11 ENCOUNTER — Other Ambulatory Visit (INDEPENDENT_AMBULATORY_CARE_PROVIDER_SITE_OTHER): Payer: Medicare Other

## 2023-02-11 VITALS — BP 167/81 | HR 77 | Ht 61.5 in | Wt 156.4 lb

## 2023-02-11 DIAGNOSIS — R413 Other amnesia: Secondary | ICD-10-CM | POA: Diagnosis not present

## 2023-02-11 LAB — VITAMIN B12: Vitamin B-12: 1056 pg/mL — ABNORMAL HIGH (ref 211–911)

## 2023-02-11 LAB — TSH: TSH: 1.41 u[IU]/mL (ref 0.35–5.50)

## 2023-02-11 MED ORDER — MEMANTINE HCL 5 MG PO TABS: 5.0000 mg | ORAL_TABLET | Freq: Every evening | ORAL | 2 refills | Status: DC

## 2023-02-11 NOTE — Progress Notes (Signed)
Assessment/Plan:    The patient is seen in neurologic consultation at the request of Burns, Bobette Mo, MD for the evaluation of memory.  Brooke [name] is a very pleasant 72 y.o. year old RH female with a history of hypertension, hyperlipidemia, anxiety, situational depression, arthritis, CAD, prediabetes, microcytic anemia, insomnia, seen today for evaluation of memory loss. MoCA today is 15/30 . Patient is able to participate on his IADLs and continues to drive although  this is now being monitored by her son. Workup is in progress.     Memory Impairment  MRI brain without contrast to assess for underlying structural abnormality and assess vascular load  Neurocognitive testing to further evaluate cognitive concerns and determine other underlying cause of memory changes, including potential contribution from sleep, anxiety, or depression  Recommend good control of cardiovascular risk factors.   Check B12 and B12  Continue to control mood as per PCP Start memantine 5 mg at bedtime, goal to increase to 5 mg bid if tolerated. Side effects discussed  Recommend increasing socialization [place] up in 1 month   Subjective:    The patient is accompanied by her son who supplements the history.    How long did patient have memory difficulties?  She denies any difficulties at this time.  In the past, about 1 year ago he reported having some forgetfulness, but now she feels that she is at her normal.  Her children believe that her memory is "not good". She writes her grocery list not to forget the items.  She forgets her son's schedules "If I come from my second shift she asks why am I at home, I should be at work". She likes going to [employer]. She loves crossword puzzles.  repeats oneself?  Endorsed Disoriented when walking into a room?  Patient denies   Leaving objects in unusual places?  Her sons are concerned that she loses things more frequently especially the keys.  Wandering behavior?  Denies.   Any personality changes ? Denies.   Any history of depression?:  She has a history of depression and anxiety  Hallucinations or paranoia? Denies.   Seizures? denies    Any sleep changes?  Sleeps well. Denies vivid dreams, REM behavior or sleepwalking   Sleep apnea? Denies.   Any hygiene concerns?  Denies.   Independent of bathing and dressing? Endorsed  Does the patient need help with medications?  Patient is in charge   Who is in charge of the finances? Patient is in charge     Any changes in appetite?  Appetite is decreased "I am the only one there to eat"    Patient have trouble swallowing?  denies   Does the patient cook? No.  Any headaches?  denies   Chronic pain? denies   Ambulates with difficulty? Denies.   Recent falls or head injuries? denies     Vision changes? Unilateral weakness, numbness or tingling? denies   Any tremors? Denies.   Any anosmia? Denies.   Any incontinence of urine? Denies.   Any bowel dysfunction? Denies.      Patient lives alone.  History of heavy alcohol intake? Denies.   History of heavy tobacco use? Denies.   Family history of dementia? Patient denies  Does patient drive? Yes, she denies any issues, but son reports that coming here she got lost. She was going to "rehearse" before coming here.    Allergies  Allergen Reactions   Penicillins Hives    Current Outpatient  Medications  Medication Instructions   amLODipine (NORVASC) 10 MG tablet Take 1 tablet by mouth once daily   aspirin 81 mg, Daily   atenolol (TENORMIN) 25 MG tablet Take 1 tablet by mouth once daily   atorvastatin (LIPITOR) 20 mg, Oral, Daily   dicyclomine (BENTYL) 10 MG capsule TAKE 1 CAPSULE BY MOUTH ONCE DAILY WITH BREAKFAST   famotidine (PEPCID) 40 mg, Oral, Daily PRN   memantine (NAMENDA) 5 mg, Oral, Nightly   potassium chloride (KLOR-CON) 10 MEQ tablet TAKE 1 TABLET BY MOUTH THREE TIMES DAILY   sertraline (ZOLOFT) 100 MG tablet Take 1 tablet by mouth once daily      VITALS:   Vitals:   02/11/23 0741 02/11/23 0747  BP: (!) 163/76 (!) 167/81  Pulse: 77   SpO2: 98%   Weight: 156 lb 6.4 oz (70.9 kg)   Height: 5' 1.5" (1.562 m)       PHYSICAL EXAM   HEENT:  Normocephalic, atraumatic.  The superficial temporal arteries are without ropiness or tenderness. Cardiovascular: Regular rate and rhythm. Lungs: Clear to auscultation bilaterally. Neck: There are no carotid bruits noted bilaterally.  NEUROLOGICAL:    02/11/2023   11:00 AM  Montreal Cognitive Assessment   Visuospatial/ Executive (0/5) 3  Naming (0/3) 3  Attention: Read list of digits (0/2) 2  Attention: Read list of letters (0/1) 1  Attention: Serial 7 subtraction starting at 100 (0/3) 1  Language: Repeat phrase (0/2) 1  Language : Fluency (0/1) 0  Abstraction (0/2) 0  Delayed Recall (0/5) 0  Orientation (0/6) 3  Total 14  Adjusted Score (based on education) 15       08/21/2016    3:01 PM  MMSE - Mini Mental State Exam  Not completed: Refused     Orientation:  Alert and oriented to person, place and not to time . No aphasia or dysarthria. Fund of knowledge is appropriate. Recent and remote memory impaired.  Attention and concentration are normal .  Able to name objects and repeat phrases. Delayed recall 0 /5  Cranial nerves: There is good facial symmetry. Extraocular muscles are intact and visual fields are full to confrontational testing. Speech is fluent and clear. No tongue deviation. Hearing is intact to conversational tone.  Tone: Tone is good throughout. Sensation: Sensation is intact to light touch.  Vibration is intact at the bilateral big toe.  Coordination: The patient has no difficulty with RAM's or FNF bilaterally. Normal finger to nose  Motor: Strength is 5/5 in the bilateral upper and lower extremities. There is no pronator drift. There are no fasciculations noted. DTR's: Deep tendon reflexes are 2/4 bilaterally. Gait and Station: The patient is able to ambulate  without difficulty. Gait is cautious and narrow. Stride length is normal        Thank you for allowing [place] the opportunity to participate in the care of this nice patient. Please do not hesitate to contact us for any questions or concerns.   Total time spent on today's visit was 47 minutes dedicated to this patient today, preparing to see patient, examining the patient, ordering tests and/or medications and counseling the patient, documenting clinical information in the EHR or other health record, independently interpreting results and communicating results to the patient/family, discussing treatment and goals, answering patient's questions and coordinating care.  Cc:  Pincus Sanes, MD  Marlowe Kays 02/11/2023 11:22 AM

## 2023-02-11 NOTE — Progress Notes (Signed)
B12 and thyroid levels are normal, thanks

## 2023-02-11 NOTE — Patient Instructions (Addendum)
It was a pleasure to see you today at our office.   Recommendations:  Neurocognitive evaluation at our office   MRI of the brain, the radiology office will call you to arrange you appointment    Follow up Oct 16 at 2 :30 to review result  Check labs today    Start Memantine 5mg  tablets.  Take 1 tablet at bedtime    Consider [facility]  [address], [place] [address] [phone]  Hours of Operation Mondays to Thursdays: 8 am to 8 pm,Fridays: 9 am to 8 pm, Saturdays: 9 am to 1 pm Sundays: Closed  [email or web address]    For psychiatric meds, mood meds: Please have your primary care physician manage these medications.  If you have any severe symptoms of a stroke, or other severe issues such as confusion,severe chills or fever, etc call 911 or go to the ER as you may need to be evaluated further  For guidance regarding [facility] and if placement were needed at the facility, contact Social Worker tel: [phone]  For assessment of decision of mental capacity and competency:  Call Dr. Erick Blinks, geriatric psychiatrist at [phone]  Counseling regarding caregiver distress, including caregiver depression, anxiety and issues regarding community resources, adult day care programs, adult living facilities, or memory care questions:  please contact your  Primary Doctor's Social Worker   Whom to call: Memory  decline, memory medications: Call our office [phone]    [email or web address]   RECOMMENDATIONS FOR ALL PATIENTS WITH MEMORY PROBLEMS: 1. Continue to exercise (Recommend 30 minutes of walking everyday, or 3 hours every week) 2. Increase social interactions - continue going to [place] and enjoy social gatherings with friends and family 3. Eat healthy, avoid fried foods and eat more fruits and  vegetables 4. Maintain adequate blood pressure, blood sugar, and blood cholesterol level. Reducing the risk of stroke and cardiovascular disease also helps promoting better memory. 5. Avoid stressful situations. Live a simple life and avoid aggravations. Organize your time and prepare for the next day in anticipation. 6. Sleep well, avoid any interruptions of sleep and avoid any distractions in the bedroom that may interfere with adequate sleep quality 7. Avoid sugar, avoid sweets as there is a strong link between excessive sugar intake, diabetes, and cognitive impairment We discussed the Mediterranean diet, which has been shown to help patients reduce the risk of progressive memory disorders and reduces cardiovascular risk. This includes eating fish, eat fruits and green leafy vegetables, nuts like almonds and hazelnuts, walnuts, and also use olive oil. Avoid fast foods and fried foods as much as possible. Avoid sweets and sugar as sugar use has been linked to worsening of memory function.  There is always a concern of gradual progression of memory problems. If this is the case, then we may need to adjust level of care according to patient needs. Support, both to the patient and caregiver, should then be put into place.      You have been referred for a neuropsychological evaluation (i.e., evaluation of memory and thinking abilities). Please bring someone with you to this appointment if possible, as it is helpful for the doctor to hear from both you and another adult who knows you well. Please bring eyeglasses and hearing aids if you wear them.    The evaluation will take approximately 3 hours and has two parts:   The first part is a clinical interview with the neuropsychologist (Dr. Milbert Coulter or Dr. Roseanne Reno). During the  interview, the neuropsychologist will speak with you and the individual you brought to the appointment.    The second part of the evaluation is testing with the doctor's technician  Annabelle Harman or Selena Batten). During the testing, the technician will ask you to remember different types of material, solve problems, and answer some questionnaires. Your family member will not be present for this portion of the evaluation.   Please note: We must reserve several hours of the neuropsychologist's time and the psychometrician's time for your evaluation appointment. As such, there is a No-Show fee of $100. If you are unable to attend any of your appointments, please contact our office as soon as possible to reschedule.      DRIVING: Regarding driving, in patients with progressive memory problems, driving will be impaired. We advise to have someone else do the driving if trouble finding directions or if minor accidents are reported. Independent driving assessment is available to determine safety of driving.   If you are interested in the driving assessment, you can contact the following:  [facility] [employer] [facility] in [place] [phone]  [facility] [phone]  [facility] [phone]  [facility] [phone] or [phone]   FALL PRECAUTIONS: Be cautious when walking. Scan the area for obstacles that may increase the risk of trips and falls. When getting up in the mornings, sit up at the edge of the bed for a few minutes before getting out of bed. Consider elevating the bed at the head end to avoid drop of blood pressure when getting up. Walk always in a well-lit room (use night lights in the walls). Avoid area rugs or power cords from appliances in the middle of the walkways. Use a walker or a cane if necessary and consider physical therapy for balance exercise. Get your eyesight checked regularly.  FINANCIAL OVERSIGHT: Supervision, especially oversight when making financial decisions or transactions is also recommended.  HOME SAFETY: Consider the safety of the kitchen when operating appliances like stoves, microwave oven, and blender. Consider  having supervision and share cooking responsibilities until no longer able to participate in those. Accidents with firearms and other hazards in the house should be identified and addressed as well.   ABILITY TO BE LEFT ALONE: If patient is unable to contact 911 operator, consider using LifeLine, or when the need is there, arrange for someone to stay with patients. Smoking is a fire hazard, consider supervision or cessation. Risk of wandering should be assessed by caregiver and if detected at any point, supervision and safe proof recommendations should be instituted.  MEDICATION SUPERVISION: Inability to self-administer medication needs to be constantly addressed. Implement a mechanism to ensure safe administration of the medications.      Mediterranean Diet A Mediterranean diet refers to food and lifestyle choices that are based on the traditions of countries located on the [employer]. This way of eating has been shown to help prevent certain conditions and improve outcomes for people who have chronic diseases, like kidney disease and heart disease. What are tips for following this plan? Lifestyle  Cook and eat meals together with your family, when possible. Drink enough fluid to keep your urine clear or pale yellow. Be physically active every day. This includes: Aerobic exercise like running or swimming. Leisure activities like gardening, walking, or housework. Get 7-8 hours of sleep each night. If recommended by your health care provider, drink red wine in moderation. This means 1 glass a day for nonpregnant women and 2 glasses a day for  men. A glass of wine equals 5 oz (150 mL). Reading food labels  Check the serving size of packaged foods. For foods such as rice and pasta, the serving size refers to the amount of cooked product, not dry. Check the total fat in packaged foods. Avoid foods that have saturated fat or trans fats. Check the ingredients list for added sugars, such as  corn syrup. Shopping  At the grocery store, buy most of your food from the areas near the walls of the store. This includes: Fresh fruits and vegetables (produce). Grains, beans, nuts, and seeds. Some of these may be available in unpackaged forms or large amounts (in bulk). Fresh seafood. Poultry and eggs. Low-fat dairy products. Buy whole ingredients instead of prepackaged foods. Buy fresh fruits and vegetables in-season from local farmers markets. Buy frozen fruits and vegetables in resealable bags. If you do not have access to quality fresh seafood, buy precooked frozen shrimp or canned fish, such as tuna, salmon, or sardines. Buy small amounts of raw or cooked vegetables, salads, or olives from the deli or salad bar at your store. Stock your pantry so you always have certain foods on hand, such as olive oil, canned tuna, canned tomatoes, rice, pasta, and beans. Cooking  Cook foods with extra-virgin olive oil instead of using butter or other vegetable oils. Have meat as a side dish, and have vegetables or grains as your main dish. This means having meat in small portions or adding small amounts of meat to foods like pasta or stew. Use beans or vegetables instead of meat in common dishes like chili or lasagna. Experiment with different cooking methods. Try roasting or broiling vegetables instead of steaming or sauteing them. Add frozen vegetables to soups, stews, pasta, or rice. Add nuts or seeds for added healthy fat at each meal. You can add these to yogurt, salads, or vegetable dishes. Marinate fish or vegetables using olive oil, lemon juice, garlic, and fresh herbs. Meal planning  Plan to eat 1 vegetarian meal one day each week. Try to work up to 2 vegetarian meals, if possible. Eat seafood 2 or more times a week. Have healthy snacks readily available, such as: Vegetable sticks with hummus. Greek yogurt. Fruit and nut trail mix. Eat balanced meals throughout the week. This  includes: Fruit: 2-3 servings a day Vegetables: 4-5 servings a day Low-fat dairy: 2 servings a day Fish, poultry, or lean meat: 1 serving a day Beans and legumes: 2 or more servings a week Nuts and seeds: 1-2 servings a day Whole grains: 6-8 servings a day Extra-virgin olive oil: 3-4 servings a day Limit red meat and sweets to only a few servings a month What are my food choices? Mediterranean diet Recommended Grains: Whole-grain pasta. Brown rice. Bulgar wheat. Polenta. Couscous. Whole-wheat bread. Orpah Cobb. Vegetables: Artichokes. Beets. Broccoli. Cabbage. Carrots. Eggplant. Green beans. Chard. Kale. Spinach. Onions. Leeks. Peas. Squash. Tomatoes. Peppers. Radishes. Fruits: Apples. Apricots. Avocado. Berries. Bananas. Cherries. Dates. Figs. Grapes. Lemons. Melon. Oranges. Peaches. Plums. Pomegranate. Meats and other protein foods: Beans. Almonds. Sunflower seeds. Pine nuts. Peanuts. Cod. Salmon. Scallops. Shrimp. Tuna. Tilapia. Clams. Oysters. Eggs. Dairy: Low-fat milk. Cheese. Greek yogurt. Beverages: Water. Red wine. Herbal tea. Fats and oils: Extra virgin olive oil. Avocado oil. Grape seed oil. Sweets and desserts: [place] yogurt with honey. Baked apples. Poached pears. Trail mix. Seasoning and other foods: Basil. Cilantro. Coriander. Cumin. Mint. Parsley. Sage. Rosemary. Tarragon. Garlic. Oregano. Thyme. Pepper. Balsalmic vinegar. Tahini. Hummus. Tomato sauce. Olives. Mushrooms. Limit  these Grains: Prepackaged pasta or rice dishes. Prepackaged cereal with added sugar. Vegetables: Deep fried potatoes (french fries). Fruits: Fruit canned in syrup. Meats and other protein foods: Beef. Pork. Lamb. Poultry with skin. Hot dogs. Tomasa Blase. Dairy: Ice cream. Sour cream. Whole milk. Beverages: Juice. Sugar-sweetened soft drinks. Beer. Liquor and spirits. Fats and oils: Butter. Canola oil. Vegetable oil. Beef fat (tallow). Lard. Sweets and desserts: Cookies. Cakes. Pies. Candy. Seasoning  and other foods: Mayonnaise. Premade sauces and marinades. The items listed may not be a complete list. Talk with your dietitian about what dietary choices are right for you. Summary The Mediterranean diet includes both food and lifestyle choices. Eat a variety of fresh fruits and vegetables, beans, nuts, seeds, and whole grains. Limit the amount of red meat and sweets that you eat. Talk with your health care provider about whether it is safe for you to drink red wine in moderation. This means 1 glass a day for nonpregnant women and 2 glasses a day for men. A glass of wine equals 5 oz (150 mL). This information is not intended to replace advice given to you by your health care provider. Make sure you discuss any questions you have with your health care provider. Document Released: 01/17/2016 Document Revised: 02/19/2016 Document Reviewed: 01/17/2016 Elsevier Interactive Patient Education  2017 [employer].

## 2023-02-12 ENCOUNTER — Encounter: Payer: Self-pay | Admitting: Internal Medicine

## 2023-02-12 NOTE — Progress Notes (Deleted)
      Subjective:    Patient ID: Brooke [name], female    DOB: [birth year], 72 y.o.   MRN: [id number]     HPI Rahma is here for follow up of her chronic medical problems.  Saw Marlowe Kays recently - MoCA was 15/30.  MRI ordered.  Memantine started    Medications and allergies reviewed with patient and updated if appropriate.  Current Outpatient Medications on File Prior to Visit  Medication Sig Dispense Refill   amLODipine (NORVASC) 10 MG tablet Take 1 tablet by mouth once daily 90 tablet 0   aspirin 81 MG chewable tablet Chew 81 mg by mouth daily.     atenolol (TENORMIN) 25 MG tablet Take 1 tablet by mouth once daily 90 tablet 0   atorvastatin (LIPITOR) 20 MG tablet Take 1 tablet (20 mg total) by mouth daily. 90 tablet 3   dicyclomine (BENTYL) 10 MG capsule TAKE 1 CAPSULE BY MOUTH ONCE DAILY WITH BREAKFAST 30 capsule 0   famotidine (PEPCID) 40 MG tablet Take 1 tablet (40 mg total) by mouth daily as needed for heartburn or indigestion. 30 tablet 5   memantine (NAMENDA) 5 MG tablet Take 1 tablet (5 mg total) by mouth at bedtime. 30 tablet 2   potassium chloride (KLOR-CON) 10 MEQ tablet TAKE 1 TABLET BY MOUTH THREE TIMES DAILY 270 tablet 1   sertraline (ZOLOFT) 100 MG tablet Take 1 tablet by mouth once daily 90 tablet 1   [DISCONTINUED] potassium chloride (KLOR-CON M10) 10 MEQ tablet Take 1 tablet (10 mEq total) by mouth three times daily. 90 tablet 0   No current facility-administered medications on file prior to visit.     Review of Systems     Objective:  There were no vitals filed for this visit. BP Readings from Last 3 Encounters:  02/11/23 (!) 167/81  01/19/23 126/74  11/26/22 136/74   Wt Readings from Last 3 Encounters:  02/11/23 156 lb 6.4 oz (70.9 kg)  01/19/23 157 lb (71.2 kg)  11/26/22 157 lb 6 oz (71.4 kg)   There is no height or weight on file to calculate BMI.    Physical Exam     Lab Results  Component Value Date   WBC 8.7 11/20/2021   HGB  12.4 11/20/2021   HCT 38.7 11/20/2021   PLT 211.0 11/20/2021   GLUCOSE 86 11/12/2022   CHOL 164 11/12/2022   TRIG 79.0 11/12/2022   HDL 68.90 11/12/2022   LDLDIRECT 162.0 08/05/2012   LDLCALC 79 11/12/2022   ALT 21 11/12/2022   AST 23 11/12/2022   NA 140 11/12/2022   K 3.9 11/12/2022   CL 105 11/12/2022   CREATININE 0.92 11/12/2022   BUN 19 11/12/2022   CO2 23 11/12/2022   TSH 1.41 02/11/2023   INR 1.9 (H) 04/15/2008   HGBA1C 5.6 11/12/2022   MICROALBUR 24.2 (H) 03/19/2009     Assessment & Plan:    See Problem List for Assessment and Plan of chronic medical problems.

## 2023-02-13 ENCOUNTER — Ambulatory Visit: Payer: Medicare Other | Admitting: Internal Medicine

## 2023-02-13 ENCOUNTER — Encounter: Payer: Self-pay | Admitting: Pharmacist

## 2023-02-13 DIAGNOSIS — I10 Essential (primary) hypertension: Secondary | ICD-10-CM

## 2023-02-17 NOTE — Progress Notes (Unsigned)
Subjective:    Patient ID: Brooke [name], female    DOB: Oct 07, 1950, 72 y.o.   MRN: [id number]     HPI Brooke [name] is here for follow up of her chronic medical problems.  Saw Marlowe Kays recently - MoCA was 15/30.  MRI ordered.  Memantine started  She is not sleeping.  She has difficulty falling asleep.  Goes to bed 10-12.  She sometimes reads in bed.  It can take 2-3 hours to fall asleep.  Once she is asleep she will sleep.  She wakes up 6:30-7.   When she wakes up she is not tired and denies feeling tired during the day.    She exercises.  She has read at night to help her fall asleep, but it does not always work.  She would prefer not to take any prescription medications.  In the past she has tried trazodone and Remeron and both were not effective..    She does feel anxious.  She is no longer taking the sertraline and does not feel like she needs anything she thinks she is handling the anxiety okay.  Medications and allergies reviewed with patient and updated if appropriate.  Current Outpatient Medications on File Prior to Visit  Medication Sig Dispense Refill   amLODipine (NORVASC) 10 MG tablet Take 1 tablet by mouth once daily 90 tablet 0   aspirin 81 MG chewable tablet Chew 81 mg by mouth daily.     atenolol (TENORMIN) 25 MG tablet Take 1 tablet by mouth once daily 90 tablet 0   atorvastatin (LIPITOR) 20 MG tablet Take 1 tablet (20 mg total) by mouth daily. 90 tablet 3   dicyclomine (BENTYL) 10 MG capsule TAKE 1 CAPSULE BY MOUTH ONCE DAILY WITH BREAKFAST 30 capsule 0   famotidine (PEPCID) 40 MG tablet Take 1 tablet (40 mg total) by mouth daily as needed for heartburn or indigestion. 30 tablet 5   memantine (NAMENDA) 5 MG tablet Take 1 tablet (5 mg total) by mouth at bedtime. 30 tablet 2   potassium chloride (KLOR-CON) 10 MEQ tablet TAKE 1 TABLET BY MOUTH THREE TIMES DAILY 270 tablet 1   [DISCONTINUED] potassium chloride (KLOR-CON M10) 10 MEQ tablet Take 1 tablet (10 mEq  total) by mouth three times daily. 90 tablet 0   No current facility-administered medications on file prior to visit.     Review of Systems     Objective:   Vitals:   02/18/23 1424  BP: 130/68  Pulse: (!) 53  Temp: 98.2 F (36.8 C)  SpO2: 97%   BP Readings from Last 3 Encounters:  02/18/23 130/68  02/11/23 (!) 167/81  01/19/23 126/74   Wt Readings from Last 3 Encounters:  02/18/23 156 lb 9.6 oz (71 kg)  02/11/23 156 lb 6.4 oz (70.9 kg)  01/19/23 157 lb (71.2 kg)   Body mass index is 29.11 kg/m.    Physical Exam Constitutional:      General: She is not in acute distress.    Appearance: Normal appearance.  HENT:     Head: Normocephalic and atraumatic.  Eyes:     Conjunctiva/sclera: Conjunctivae normal.  Cardiovascular:     Rate and Rhythm: Normal rate and regular rhythm.     Heart sounds: Normal heart sounds.  Pulmonary:     Effort: Pulmonary effort is normal. No respiratory distress.     Breath sounds: Normal breath sounds. No wheezing.  Musculoskeletal:     Cervical back: Neck supple.  Right lower leg: No edema.     Left lower leg: No edema.  Lymphadenopathy:     Cervical: No cervical adenopathy.  Skin:    General: Skin is warm and dry.     Findings: No rash.  Neurological:     Mental Status: She is alert. Mental status is at baseline.  Psychiatric:        Mood and Affect: Mood normal.        Behavior: Behavior normal.        Lab Results  Component Value Date   WBC 8.7 11/20/2021   HGB 12.4 11/20/2021   HCT 38.7 11/20/2021   PLT 211.0 11/20/2021   GLUCOSE 86 11/12/2022   CHOL 164 11/12/2022   TRIG 79.0 11/12/2022   HDL 68.90 11/12/2022   LDLDIRECT 162.0 08/05/2012   LDLCALC 79 11/12/2022   ALT 21 11/12/2022   AST 23 11/12/2022   NA 140 11/12/2022   K 3.9 11/12/2022   CL 105 11/12/2022   CREATININE 0.92 11/12/2022   BUN 19 11/12/2022   CO2 23 11/12/2022   TSH 1.41 02/11/2023   INR 1.9 (H) 04/15/2008   HGBA1C 5.6 11/12/2022    MICROALBUR 24.2 (H) 03/19/2009     Assessment & Plan:    See Problem List for Assessment and Plan of chronic medical problems.

## 2023-02-18 ENCOUNTER — Encounter: Payer: Self-pay | Admitting: Internal Medicine

## 2023-02-18 ENCOUNTER — Ambulatory Visit (INDEPENDENT_AMBULATORY_CARE_PROVIDER_SITE_OTHER): Payer: Medicare Other | Admitting: Internal Medicine

## 2023-02-18 VITALS — BP 130/68 | HR 53 | Temp 98.2°F | Ht 61.5 in | Wt 156.6 lb

## 2023-02-18 DIAGNOSIS — G479 Sleep disorder, unspecified: Secondary | ICD-10-CM

## 2023-02-18 DIAGNOSIS — Z23 Encounter for immunization: Secondary | ICD-10-CM | POA: Diagnosis not present

## 2023-02-18 DIAGNOSIS — I10 Essential (primary) hypertension: Secondary | ICD-10-CM

## 2023-02-18 DIAGNOSIS — R413 Other amnesia: Secondary | ICD-10-CM

## 2023-02-18 DIAGNOSIS — F329 Major depressive disorder, single episode, unspecified: Secondary | ICD-10-CM | POA: Diagnosis not present

## 2023-02-18 NOTE — Assessment & Plan Note (Signed)
No longer taking sertraline Denies depression - has a little anxiety but does not feel like she needs medication monitor

## 2023-02-18 NOTE — Assessment & Plan Note (Signed)
Chronic Blood pressure well controlled Continue amlodipine 10 mg daily, atenolol 25 mg daily 

## 2023-02-18 NOTE — Patient Instructions (Addendum)
Flu immunization administered today.     Medications changes include :   melatonin or an herbal sleep aid - they are over the counter and natural      Return in about 6 months (around 08/18/2023) for follow up.    Insomnia Insomnia is a sleep disorder that makes it difficult to fall asleep or stay asleep. Insomnia can cause fatigue, low energy, difficulty concentrating, mood swings, and poor performance at work or school. There are three different ways to classify insomnia: Difficulty falling asleep. Difficulty staying asleep. Waking up too early in the morning. Any type of insomnia can be long-term (chronic) or short-term (acute). Both are common. Short-term insomnia usually lasts for 3 months or less. Chronic insomnia occurs at least three times a week for longer than 3 months. What are the causes? Insomnia may be caused by another condition, situation, or substance, such as: Having certain mental health conditions, such as anxiety and depression. Using caffeine, alcohol, tobacco, or drugs. Having gastrointestinal conditions, such as gastroesophageal reflux disease (GERD). Having certain medical conditions. These include: Asthma. Alzheimer's disease. Stroke. Chronic pain. An overactive thyroid gland (hyperthyroidism). Other sleep disorders, such as restless legs syndrome and sleep apnea. Menopause. Sometimes, the cause of insomnia may not be known. What increases the risk? Risk factors for insomnia include: Gender. Females are affected more often than males. Age. Insomnia is more common as people get older. Stress and certain medical and mental health conditions. Lack of exercise. Having an irregular work schedule. This may include working night shifts and traveling between different time zones. What are the signs or symptoms? If you have insomnia, the main symptom is having trouble falling asleep or having trouble staying asleep. This may lead to other symptoms, such  as: Feeling tired or having low energy. Feeling nervous about going to sleep. Not feeling rested in the morning. Having trouble concentrating. Feeling irritable, anxious, or depressed. How is this diagnosed? This condition may be diagnosed based on: Your symptoms and medical history. Your health care provider may ask about: Your sleep habits. Any medical conditions you have. Your mental health. A physical exam. How is this treated? Treatment for insomnia depends on the cause. Treatment may focus on treating an underlying condition that is causing the insomnia. Treatment may also include: Medicines to help you sleep. Counseling or therapy. Lifestyle adjustments to help you sleep better. Follow these instructions at home: Eating and drinking  Limit or avoid alcohol, caffeinated beverages, and products that contain nicotine and tobacco, especially close to bedtime. These can disrupt your sleep. Do not eat a large meal or eat spicy foods right before bedtime. This can lead to digestive discomfort that can make it hard for you to sleep. Sleep habits  Keep a sleep diary to help you and your health care provider figure out what could be causing your insomnia. Write down: When you sleep. When you wake up during the night. How well you sleep and how rested you feel the next day. Any side effects of medicines you are taking. What you eat and drink. Make your bedroom a dark, comfortable place where it is easy to fall asleep. Put up shades or blackout curtains to block light from outside. Use a white noise machine to block noise. Keep the temperature cool. Limit screen use before bedtime. This includes: Not watching TV. Not using your smartphone, tablet, or computer. Stick to a routine that includes going to bed and waking up at the same times every day  and night. This can help you fall asleep faster. Consider making a quiet activity, such as reading, part of your nighttime routine. Try to  avoid taking naps during the day so that you sleep better at night. Get out of bed if you are still awake after 15 minutes of trying to sleep. Keep the lights down, but try reading or doing a quiet activity. When you feel sleepy, go back to bed. General instructions Take over-the-counter and prescription medicines only as told by your health care provider. Exercise regularly as told by your health care provider. However, avoid exercising in the hours right before bedtime. Use relaxation techniques to manage stress. Ask your health care provider to suggest some techniques that may work well for you. These may include: Breathing exercises. Routines to release muscle tension. Visualizing peaceful scenes. Make sure that you drive carefully. Do not drive if you feel very sleepy. Keep all follow-up visits. This is important. Contact a health care provider if: You are tired throughout the day. You have trouble in your daily routine due to sleepiness. You continue to have sleep problems, or your sleep problems get worse. Get help right away if: You have thoughts about hurting yourself or someone else. Get help right away if you feel like you may hurt yourself or others, or have thoughts about taking your own life. Go to your nearest emergency room or: Call 911. Call the National Suicide Prevention Lifeline at 772 309 5719 or 988. This is open 24 hours a day. Text the Crisis Text Line at 437-784-7582. Summary Insomnia is a sleep disorder that makes it difficult to fall asleep or stay asleep. Insomnia can be long-term (chronic) or short-term (acute). Treatment for insomnia depends on the cause. Treatment may focus on treating an underlying condition that is causing the insomnia. Keep a sleep diary to help you and your health care provider figure out what could be causing your insomnia. This information is not intended to replace advice given to you by your health care provider. Make sure you discuss any  questions you have with your health care provider. Document Revised: 05/06/2021 Document Reviewed: 05/06/2021 Elsevier Patient Education  2024 [employer].

## 2023-02-18 NOTE — Assessment & Plan Note (Signed)
Chronic Sleep issues started when her husband died Trazodone, mirtazapine not effective Not currently taking anything Has difficulty falling asleep, but once asleep usually does pretty well Would like to avoid prescription medication Discussed melatonin or other over-the-counter natural sleep aids that she can try Discussed taking a bath before going to bed, reading, avoiding watching TV, trying essential oils or white noise Discussed the importance of regular exercise but not too close to bedtime Advised her to follow-up if her sleep is not improving

## 2023-02-18 NOTE — Assessment & Plan Note (Signed)
New Has seen neurology  MoCa 15/30 Started on mamantine 5 mg nightly

## 2023-02-28 ENCOUNTER — Other Ambulatory Visit: Payer: Medicare Other

## 2023-03-01 ENCOUNTER — Other Ambulatory Visit: Payer: Self-pay | Admitting: Internal Medicine

## 2023-03-03 ENCOUNTER — Other Ambulatory Visit: Payer: Self-pay | Admitting: Internal Medicine

## 2023-03-06 ENCOUNTER — Other Ambulatory Visit: Payer: Self-pay | Admitting: Nurse Practitioner

## 2023-03-06 ENCOUNTER — Other Ambulatory Visit: Payer: Self-pay | Admitting: Internal Medicine

## 2023-03-06 DIAGNOSIS — T63301A Toxic effect of unspecified spider venom, accidental (unintentional), initial encounter: Secondary | ICD-10-CM

## 2023-03-13 ENCOUNTER — Other Ambulatory Visit: Payer: Medicare Other

## 2023-03-14 ENCOUNTER — Other Ambulatory Visit: Payer: Self-pay | Admitting: Internal Medicine

## 2023-03-16 ENCOUNTER — Telehealth: Payer: Self-pay

## 2023-03-16 ENCOUNTER — Other Ambulatory Visit: Payer: Self-pay | Admitting: Internal Medicine

## 2023-03-16 NOTE — Telephone Encounter (Signed)
Spoke with patient she is going to schedule at [facility].

## 2023-03-16 NOTE — Telephone Encounter (Signed)
MRI of brain wo contrast was cancelled by patient, per [facility] [phone].

## 2023-03-17 ENCOUNTER — Encounter: Payer: Self-pay | Admitting: Internal Medicine

## 2023-03-17 NOTE — Progress Notes (Unsigned)
      Subjective:    Patient ID: Brooke [name], female    DOB: [birth year], 72 y.o.   MRN: [id number]     HPI Brooke [name] is here for follow up of her chronic medical problems.  Feeling fine.  Occasionally walks in the neighborhood.   Sleep is getting better.     Medications and allergies reviewed with patient and updated if appropriate.  Current Outpatient Medications on File Prior to Visit  Medication Sig Dispense Refill   amLODipine (NORVASC) 10 MG tablet Take 1 tablet by mouth once daily 90 tablet 0   aspirin 81 MG chewable tablet Chew 81 mg by mouth daily.     atenolol (TENORMIN) 25 MG tablet Take 1 tablet by mouth once daily 90 tablet 0   atorvastatin (LIPITOR) 20 MG tablet Take 1 tablet (20 mg total) by mouth daily. 90 tablet 3   famotidine (PEPCID) 40 MG tablet Take 1 tablet (40 mg total) by mouth daily as needed for heartburn or indigestion. 30 tablet 5   memantine (NAMENDA) 5 MG tablet Take 1 tablet (5 mg total) by mouth at bedtime. 30 tablet 2   potassium chloride (KLOR-CON) 10 MEQ tablet TAKE 1 TABLET BY MOUTH THREE TIMES DAILY 270 tablet 1   [DISCONTINUED] potassium chloride (KLOR-CON M10) 10 MEQ tablet Take 1 tablet (10 mEq total) by mouth three times daily. 90 tablet 0   No current facility-administered medications on file prior to visit.     Review of Systems  Constitutional:  Negative for fever.  Respiratory:  Negative for cough, shortness of breath and wheezing.   Cardiovascular:  Negative for chest pain, palpitations and leg swelling.  Neurological:  Negative for light-headedness and headaches.       Objective:   Vitals:   03/18/23 1147  BP: (!) 142/78  Pulse: 68  Temp: 98 F (36.7 C)  SpO2: 95%   BP Readings from Last 3 Encounters:  03/18/23 (!) 142/78  02/18/23 130/68  02/11/23 (!) 167/81   Wt Readings from Last 3 Encounters:  03/18/23 150 lb (68 kg)  02/18/23 156 lb 9.6 oz (71 kg)  02/11/23 156 lb 6.4 oz (70.9 kg)   Body mass index is  27.88 kg/m.    Physical Exam     Lab Results  Component Value Date   WBC 8.7 11/20/2021   HGB 12.4 11/20/2021   HCT 38.7 11/20/2021   PLT 211.0 11/20/2021   GLUCOSE 86 11/12/2022   CHOL 164 11/12/2022   TRIG 79.0 11/12/2022   HDL 68.90 11/12/2022   LDLDIRECT 162.0 08/05/2012   LDLCALC 79 11/12/2022   ALT 21 11/12/2022   AST 23 11/12/2022   NA 140 11/12/2022   K 3.9 11/12/2022   CL 105 11/12/2022   CREATININE 0.92 11/12/2022   BUN 19 11/12/2022   CO2 23 11/12/2022   TSH 1.41 02/11/2023   INR 1.9 (H) 04/15/2008   HGBA1C 5.6 11/12/2022   MICROALBUR 24.2 (H) 03/19/2009     Assessment & Plan:    See Problem List for Assessment and Plan of chronic medical problems.

## 2023-03-18 ENCOUNTER — Ambulatory Visit (INDEPENDENT_AMBULATORY_CARE_PROVIDER_SITE_OTHER): Payer: Medicare Other | Admitting: Internal Medicine

## 2023-03-18 VITALS — BP 142/78 | HR 68 | Temp 98.0°F | Ht 61.5 in | Wt 150.0 lb

## 2023-03-18 DIAGNOSIS — K219 Gastro-esophageal reflux disease without esophagitis: Secondary | ICD-10-CM

## 2023-03-18 DIAGNOSIS — I10 Essential (primary) hypertension: Secondary | ICD-10-CM

## 2023-03-18 DIAGNOSIS — R413 Other amnesia: Secondary | ICD-10-CM

## 2023-03-18 DIAGNOSIS — E7849 Other hyperlipidemia: Secondary | ICD-10-CM

## 2023-03-18 NOTE — Patient Instructions (Addendum)
       Medications changes include :   stop dicyclomine - you no longer need that.     Return for follow up as scheduled. Your next appointment with me 08/18/2023.

## 2023-03-18 NOTE — Assessment & Plan Note (Signed)
Chronic Controlled Continue pepcid 40 mg daily prn

## 2023-03-18 NOTE — Assessment & Plan Note (Signed)
Chronic Has seen neurology and MoCa was 15/30 Taking memantine 5 mg nightly Hide last seen her 1 month ago.  I had advised her to follow-up in 6 months and we do have an appointment scheduled in March 2025-she does call up yesterday on schedule an appointment for today  She was late when she got here and she stated that she had on her calendar she was supposed to see me.  No concerns Stressed regular exercise-mentally and physically

## 2023-03-18 NOTE — Assessment & Plan Note (Signed)
Chronic Lab Results  Component Value Date   LDLCALC 79 11/12/2022   Lipids controlled Encouraged healthy diet and regular exercise Continue atorvastatin 20 mg daily

## 2023-03-18 NOTE — Assessment & Plan Note (Signed)
Chronic Blood pressure controlled Continue amlodipine 10 mg daily, atenolol 25 mg daily

## 2023-03-20 ENCOUNTER — Ambulatory Visit: Payer: Medicare Other | Admitting: Internal Medicine

## 2023-03-25 ENCOUNTER — Ambulatory Visit: Payer: Medicare Other | Admitting: Physician Assistant

## 2023-04-05 ENCOUNTER — Ambulatory Visit
Admission: RE | Admit: 2023-04-05 | Discharge: 2023-04-05 | Disposition: A | Discharge status: Home / Self Care | Payer: Medicare Other | Source: Ambulatory Visit | Attending: Physician Assistant | Admitting: Physician Assistant

## 2023-04-05 DIAGNOSIS — G319 Degenerative disease of nervous system, unspecified: Secondary | ICD-10-CM | POA: Diagnosis not present

## 2023-04-05 DIAGNOSIS — R413 Other amnesia: Secondary | ICD-10-CM | POA: Diagnosis not present

## 2023-04-13 ENCOUNTER — Ambulatory Visit: Payer: Medicare Other | Admitting: Psychology

## 2023-04-13 ENCOUNTER — Other Ambulatory Visit: Payer: Self-pay

## 2023-04-13 ENCOUNTER — Encounter: Payer: Self-pay | Admitting: Psychology

## 2023-04-13 DIAGNOSIS — R4189 Other symptoms and signs involving cognitive functions and awareness: Secondary | ICD-10-CM

## 2023-04-13 DIAGNOSIS — G309 Alzheimer's disease, unspecified: Secondary | ICD-10-CM | POA: Diagnosis not present

## 2023-04-13 DIAGNOSIS — G3184 Mild cognitive impairment, so stated: Secondary | ICD-10-CM | POA: Diagnosis not present

## 2023-04-13 HISTORY — DX: Mild cognitive impairment of uncertain or unknown etiology: G31.84

## 2023-04-13 NOTE — Progress Notes (Signed)
   Psychometrician Note   Cognitive testing was administered to [employer] by Wallace Keller, B.S. (psychometrist) under the supervision of Dr. Newman Nickels, Ph.D., licensed psychologist on 04/13/2023. Ms. Vanecek did not appear overtly distressed by the testing session per behavioral observation or responses across self-report questionnaires. Rest breaks were offered.    The battery of tests administered was selected by Dr. Newman Nickels, Ph.D. with consideration to Ms. Bumgarner's current level of functioning, the nature of her symptoms, emotional and behavioral responses during interview, level of literacy, observed level of motivation/effort, and the nature of the referral question. This battery was communicated to the psychometrist. Communication between Dr. Newman Nickels, Ph.D. and the psychometrist was ongoing throughout the evaluation and Dr. Newman Nickels, Ph.D. was immediately accessible at all times. Dr. Newman Nickels, Ph.D. provided supervision to the psychometrist on the date of this service to the extent necessary to assure the quality of all services provided.    DABRIA WADAS will return within approximately 1-2 weeks for an interactive feedback session with Dr. Milbert Coulter at which time her test performances, clinical impressions, and treatment recommendations will be reviewed in detail. Ms. Whittier understands she can contact our office should she require our assistance before this time.  A total of 125 minutes of billable time were spent face-to-face with Ms. Kostka by the psychometrist. This includes both test administration and scoring time. Billing for these services is reflected in the clinical report generated by Dr. Newman Nickels, Ph.D.  This note reflects time spent with the psychometrician and does not include test scores or any clinical interpretations made by Dr. Milbert Coulter. The full report will follow in a separate note.

## 2023-04-13 NOTE — Progress Notes (Unsigned)
NEUROPSYCHOLOGICAL EVALUATION [place] Department of Neurology  Date of Evaluation: April 13, 2023  Reason for Referral:   Brooke [name] is a 72 y.o. right-handed Caucasian female referred by Brooke Kays, PA-C, to characterize her current cognitive functioning and assist with diagnostic clarity and treatment planning in the context of subjective cognitive decline.   Assessment and Plan:   Clinical Impression(s): Brooke [name] pattern of performance is suggestive of severe impairment surrounding essentially all aspects of learning and memory. Additional impairments were exhibited across executive functioning, receptive language, semantic fluency, and confrontation naming. Performance variability was exhibited across processing speed and attention/concentration. Performances were appropriate relative to age-matched peers across phonemic fluency, safety/judgment, and visuospatial abilities (outside of a line orientation task). Brooke [name] denied difficulties completing instrumental activities of daily living (ADLs) independently. Unfortunately, she was unaccompanied to the current evaluation and no informant information was able to obtained to confirm or refute this. As such, given evidence for cognitive dysfunction described above, she best meets criteria for a Mild Neurocognitive Disorder ("mild cognitive impairment"). However, she would appear towards the severe end of this spectrum and at risk to transition to a dementia designation over the next several years.  Importantly, should her son or other family contradict her reporting and detail ongoing difficulties surrounding Brooke [name] performing instrumental ADLs independently (e.g., medication management, functional management, etc.), then a mild dementia presentation would appear the better diagnostic classification.   Regarding the cause for ongoing cognitive impairment, my primary concerns surround  Alzheimer's disease. Across memory testing, Brooke [name] did not benefit from repeated exposure to novel information, was fully amnestic (i.e., 0% retention) across all memory tasks after a brief delay, and generally performed poorly across yes/no recognition trials. Taken together, this suggests evidence for rapid forgetting and an evolving and already fairly significant storage impairment, both of which are the hallmark testing patterns of Alzheimer's disease. Additional impairments surrounding semantic fluency, confrontation naming, and executive functioning would follow typical and expected disease progression. This would appear the most likely explanation for her pattern of deficits at the present time.  She does not exhibit behavioral or testing characteristics worrisome for Lewy body disease, another more rare parkinsonian condition, or frontotemporal lobar degeneration. The results of her recent brain MRI were not available at the time of this writing. As such, I cannot comment on any anatomical abnormalities which could be influencing her current clinical presentation. Continued medical monitoring will be important moving forward.   Recommendations: Should there be a desire/need to document the transition to a dementia diagnostic classification in the future, a repeat neuropsychological evaluation could be considered at that time. This should be no sooner than 12 months from the current date.  Brooke [name] has already been prescribed a medication aimed to address memory loss and concerns surrounding Alzheimer's disease (i.e., memantine/Namenda). She is encouraged to continue taking this medication as prescribed. It is important to highlight that this medication has been shown to slow functional decline in some individuals. There is no current treatment which can stop or reverse cognitive decline when caused by a neurodegenerative illness.   Performance across neurocognitive testing is not a strong  predictor of an individual's safety operating a motor vehicle. Should her family wish to pursue a formalized driving evaluation, they could reach out to the following agencies: [employer] in [place]: [phone] [facility]: [phone] [facility]: [phone] [facility]: [phone] or [phone]  Should there be progression of current  deficits over time, Brooke [name] is unlikely to regain any independent living skills lost. Therefore, it is recommended that she remain as involved as possible in all aspects of household chores, finances, and medication management, with supervision to ensure adequate performance. she will likely benefit from the establishment and maintenance of a routine in order to maximize her functional abilities over time.  It will be important for Brooke [name] to have another person with her when in situations where she may need to process information, weigh the pros and cons of different options, and make decisions, in order to ensure that she fully understands and recalls all information to be considered.  If not already done, Brooke [name] and her family may want to discuss her wishes regarding durable power of attorney and medical decision making, so that she can have input into these choices. If they require legal assistance with this, long-term care resource access, or other aspects of estate planning, they could reach out to [employer] at [phone] for a free consultation. Additionally, they may wish to discuss future plans for caretaking and seek out community options for in home/residential care should they become necessary.  Brooke [name] is encouraged to attend to lifestyle factors for brain health (e.g., regular physical exercise, good nutrition habits and consideration of the MIND-DASH diet, regular participation in cognitively-stimulating activities, and general stress management techniques), which are likely  to have benefits for both emotional adjustment and cognition. Optimal control of vascular risk factors (including safe cardiovascular exercise and adherence to dietary recommendations) is encouraged. Continued participation in activities which provide mental stimulation and social interaction is also recommended.   Important information should be provided to Ms. Mele in written format in all instances. This information should be placed in a highly frequented and easily visible location within her home to promote recall. External strategies such as written notes in a consistently used memory journal, visual and nonverbal auditory cues such as a calendar on the refrigerator or appointments with alarm, such as on a cell phone, can also help maximize recall.  To address problems with processing speed, she may wish to consider:   -Ensuring that she is alerted when essential material or instructions are being presented   -Adjusting the speed at which new information is presented   -Allowing for more time in comprehending, processing, and responding in conversation   -Repeating and paraphrasing instructions or conversations aloud  To address problems with fluctuating attention and/or executive dysfunction, she may wish to consider:   -Avoiding external distractions when needing to concentrate   -Limiting exposure to fast paced environments with multiple sensory demands   -Writing down complicated information and using checklists   -Attempting and completing one task at a time (i.e., no multi-tasking)   -Verbalizing aloud each step of a task to maintain focus   -Taking frequent breaks during the completion of steps/tasks to avoid fatigue   -Reducing the amount of information considered at one time   -Scheduling more difficult activities for a time of day where she is usually most alert  Review of Records:   Brooke [name] was seen by her PCP Cheryll Cockayne, M.D.) on 01/19/2023 for dementia concerns. At that  time, Brooke [name] was reportedly not overly concerned about memory functioning. She noted some memory lapses at times, as well as instances where she may need to back track and change directions when driving. Her son expressed greater concern, noting that Mr. Vignola has become very forgetful with concern for rapid  forgetting. He also noted that she has been misplacing objects far more frequently and has difficulty keeping track of when he is working.  Brooke [name] was seen by [facility] Brooke Kays, PA-C) on 02/11/2023 for memory loss. Similar concerns were raised. Performance on a brief cognitive screening instrument (MOCA) was 15/30. Ultimately, Brooke [name] was referred for a comprehensive neuropsychological evaluation to characterize her cognitive abilities and to assist with diagnostic clarity and treatment planning. She was started on memantine in the interim.  Neuroimaging Head CT on 04/23/2012 in the context of headache symptoms was negative. A brain MRI had been completed on 04/05/2023. These results were not yet available at the time of this writing.   Past Medical History:  Diagnosis Date   Allergic rhinitis 02/22/2007   Eczema 03/25/2021   Essential hypertension 02/18/2007   GERD (gastroesophageal reflux disease) 06/01/2019   Hyperlipidemia 07/22/2007   Insomnia 04/15/2016   Trazodone not effective  Mirtazapine  15 mg not effective     Lipoma of chest wall 07/23/2022   Major depressive disorder 09/04/2020   Microcytic anemia 10/12/2013   Electrophoresis normal     Obesity    Osteoarthritis 03/30/2008   Osteopenia 08/25/2022   08/25/2022 spine -1.5, RFN -1.4, FRAX is low     Prediabetes 05/07/2015    Past Surgical History:  Procedure Laterality Date   CARPAL TUNNEL RELEASE     bilateral   HIP SURGERY      Current Outpatient Medications:    amLODipine (NORVASC) 10 MG tablet, Take 1 tablet by mouth once daily, Disp: 90 tablet, Rfl: 0   aspirin 81 MG chewable tablet, Chew  81 mg by mouth daily., Disp: , Rfl:    atenolol (TENORMIN) 25 MG tablet, Take 1 tablet by mouth once daily, Disp: 90 tablet, Rfl: 0   atorvastatin (LIPITOR) 20 MG tablet, Take 1 tablet (20 mg total) by mouth daily., Disp: 90 tablet, Rfl: 3   famotidine (PEPCID) 40 MG tablet, Take 1 tablet (40 mg total) by mouth daily as needed for heartburn or indigestion., Disp: 30 tablet, Rfl: 5   memantine (NAMENDA) 5 MG tablet, Take 1 tablet (5 mg total) by mouth at bedtime., Disp: 30 tablet, Rfl: 2   potassium chloride (KLOR-CON) 10 MEQ tablet, TAKE 1 TABLET BY MOUTH THREE TIMES DAILY, Disp: 270 tablet, Rfl: 1  Clinical Interview:   The following information was obtained during a clinical interview with Brooke [name] prior to cognitive testing.  Cognitive Symptoms: Decreased short-term memory: Endorsed. She described generalized forgetfulness, as well as some more specific examples surrounding trouble recalling names and details of recent conversations. She noted that prior to her husband's passing about two years ago, he had made comments surrounding concern for her getting lost while out and about. She reported concern that memory has progressively worsened over the years.  Decreased long-term memory: Denied. Decreased attention/concentration: Endorsed. She reported ongoing difficulties surrounding sustained focus and increased distractibility.  Reduced processing speed: Denied. Difficulties with executive functions: Endorsed. She reported ongoing difficulties surrounding organization and multi-tasking. She denied trouble with impulsivity. She also denied any significant personality changes.  Difficulties with emotion regulation: Denied. Difficulties with receptive language: Denied. Difficulties with word finding: Denied. Decreased visuoperceptual ability: Denied.  Difficulties completing ADLs: Denied. She alluded to instances where she might get briefly lost or turned around while driving. However, as a  whole, she denied large scale concerns.   Additional Medical History: History of traumatic brain injury/concussion: Denied. History of stroke: Denied.  History of seizure activity: Denied. History of known exposure to toxins: Denied. Symptoms of chronic pain: Denied. Experience of frequent headaches/migraines: Denied. Frequent instances of dizziness/vertigo: Denied.  Sensory changes: She wears glasses with benefit. Other sensory changes/difficulties (e.g., hearing, taste, smell) were denied.  Balance/coordination difficulties: Denied. She also denied any recent falls.  Other motor difficulties: Denied.  Sleep History: Estimated hours obtained each night: 6 hours.  Difficulties falling asleep: Endorsed. She described trouble falling asleep at times and that she generally watches television into later hours before she eventually falls asleep.  Difficulties staying asleep: Denied. Feels rested and refreshed upon awakening: Endorsed.  History of snoring: Denied. History of waking up gasping for air: Denied. Witnessed breath cessation while asleep: Denied.  History of vivid dreaming: Denied. Excessive movement while asleep: Denied. Instances of acting out her dreams: Denied.  Psychiatric/Behavioral Health History: Depression: She described her current mood as "okay" and denied to her knowledge any prior mental health concerns or formal diagnoses. Current or remote suicidal ideation, intent, or plan was denied.  Anxiety: Denied. Mania: Denied. Trauma History: Denied. Visual/auditory hallucinations: Denied. Delusional thoughts: Denied.  Tobacco: Denied. Alcohol: She denied current alcohol consumption as well as a history of problematic alcohol abuse or dependence.  Recreational drugs: Denied.  Family History: Problem Relation Age of Onset   CAD Sister    CAD Father    Kidney disease Sister    Lung cancer Mother    Brain cancer Mother    Colon cancer Neg Hx    Stomach cancer  Neg Hx    Pancreatic cancer Neg Hx    Esophageal cancer Neg Hx    Colon polyps Neg Hx    Rectal cancer Neg Hx    This information was confirmed by Ms. Colgan.  Academic/Vocational History: Highest level of educational attainment: 12 years. She graduated from high school and described herself as an average (B/C) student in academic settings. No relative weaknesses were identified.  History of developmental delay: Denied. History of grade repetition: Denied. Enrollment in special education courses: Denied. History of LD/ADHD: Denied.  Employment: Retired. 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   Evaluation Results:   Behavioral Observations: Brooke [name] was unaccompanied, arrived to her appointment on time, and was appropriately dressed and groomed. She appeared alert. Observed gait and station were within normal limits. Gross motor functioning appeared intact upon informal observation and no abnormal movements (e.g., tremors) were noted. Her affect was generally relaxed and positive. Spontaneous speech was fluent and word finding difficulties were not observed during the clinical interview. Thought processes were coherent, organized, and normal in content. Insight into her cognitive difficulties appeared fairly adequate. However, she may underestimate the full extent of ongoing cognitive dysfunction.   During testing, sustained attention was appropriate. Task engagement was adequate and she persisted when challenged. She did fatigue as the evaluation progressed. It was mildly abbreviated in response. Overall, Brooke [name] was cooperative with the clinical interview and subsequent testing procedures.   Adequacy of Effort: The validity of neuropsychological testing is limited by the extent to which the individual being tested may be assumed to have  exerted adequate effort during testing. Ms. Cloninger expressed her intention to perform to the best of her abilities and exhibited adequate task engagement and persistence. Scores across stand-alone and embedded performance validity measures were within expectation. As such,  the results of the current evaluation are believed to be a valid representation of Brooke [name]'s current cognitive functioning.  Test Results: Ms. Hunton was mildly disoriented at the time of the current evaluation. She incorrectly stated her age ("34"). She also incorrectly stated the current year ("2023") and was unable to state the current day of the week or name of the clinic.   Intellectual abilities based upon educational and vocational attainment were estimated to be in the average range. Premorbid abilities were estimated to be within the below average range based upon a single-word reading test.   Processing speed was variable, ranging from the well below average to average normative ranges. Basic attention was well below average to below average. More complex attention (e.g., working memory) was below average to average. Executive functioning was exceptionally low to well below average. She performed in the below average range across a task assessing safety and judgment.  Assessed receptive language abilities were well below average. She had trouble across all aspects of this task, including understanding conceptual information (e.g., she pointed to a triangle when asked to point to a blue square), sequencing commands, understanding more complex sentence structure, and following multi-step commands. Ms. Loschiavo was also slow to process and appeared to exhibit mild difficulties comprehending task instructions at times. She answered all questions asked of her appropriately during interview. Assessed expressive language was variable. Phonemic fluency was average, semantic fluency was exceptionally low to well below average, and  confrontation naming was exceptionally low to well below average.   Assessed visuospatial/visuoconstructional abilities were average outside of deficits across a line orientation task.    Learning (i.e., encoding) of novel verbal information was exceptionally low to well below average. Spontaneous delayed recall (i.e., retrieval) of previously learned information was exceptionally low. Retention rates were 0% across a story learning task, 0% across a list learning task, and 0% across a figure drawing task. Performance across recognition tasks was variable but largely below expectation, ranging from the exceptionally low to below average normative ranges, suggesting very limited evidence for information consolidation.   Results of emotional screening instruments suggested that recent symptoms of generalized anxiety were in the minimal range, while symptoms of depression were within normal limits. A screening instrument assessing recent sleep quality suggested the presence of minimal sleep dysfunction.  Tables of Scores:   Note: This summary of test scores accompanies the interpretive report and should not be considered in isolation without reference to the appropriate sections in the text. Descriptors are based on appropriate normative data and may be adjusted based on clinical judgment. Terms such as "Within Normal Limits" and "Outside Normal Limits" are used when a more specific description of the test score cannot be determined.       Percentile - Normative Descriptor > 98 - Exceptionally High 91-97 - Well Above Average 75-90 - Above Average 25-74 - Average 9-24 - Below Average 2-8 - Well Below Average < 2 - Exceptionally Low       Validity:   DESCRIPTOR       DCT: --- --- Within Normal Limits  RBANS EI: --- --- Within Normal Limits       Orientation:      Raw Score Percentile   NAB Orientation, Form 1 24/29 --- ---       Cognitive Screening:      Raw Score Percentile   SLUMS: 12/30  --- ---       RBANS, Form A: Standard Score/ Scaled Score Percentile  Total Score 55 <1 Exceptionally Low  Immediate Memory 61 <1 Exceptionally Low    List Learning 3 1 Exceptionally Low    Story Memory 4 2 Well Below Average  Visuospatial/Constructional 78 7 Well Below Average    Figure Copy 9 37 Average    Line Orientation 9/20 <2 Exceptionally Low  Language 64 1 Exceptionally Low    Picture Naming 8/10 3-9 Well Below Average    Semantic Fluency 3 1 Exceptionally Low  Attention 64 1 Exceptionally Low    Digit Span 5 5 Well Below Average    Coding 4 2 Well Below Average  Delayed Memory 56 <1 Exceptionally Low    List Recall 0/10 <2 Exceptionally Low    List Recognition 17/20 10-16 Below Average    Story Recall 1 <1 Exceptionally Low    Story Recognition 8/12 8-15 Well Below Average  to Below Average    Figure Recall 1 <1 Exceptionally Low    Figure Recognition 0/8 1 Exceptionally Low        Intellectual Functioning:      Standard Score Percentile   Test of Premorbid Functioning: 81 10 Below Average       Attention/Executive Function:     Trail Making Test (TMT): Raw Score (T Score) Percentile     Part A 57 secs.,  1 error (43) 25 Average    Part B Discontinued --- Impaired         Scaled Score Percentile   WAIS-IV Digit Span: 7 16 Below Average    Forward 7 16 Below Average    Backward 6 9 Below Average    Sequencing 9 37 Average       D-KEFS Verbal Fluency Test: Raw Score (Scaled Score) Percentile     Letter Total Correct 31 (9) 37 Average    Category Total Correct 18 (4) 2 Well Below Average    Category Switching Total Correct 8 (5) 5 Well Below Average    Category Switching Accuracy 5 (4) 2 Well Below Average      Total Set Loss Errors 1 (11) 63 Average      Total Repetition Errors 16 (1) <1 Exceptionally Low       NAB Executive Functions Module, Form 1: T Score Percentile     Judgment 41 18 Below Average       Language:     Verbal Fluency Test: Raw  Score (T Score) Percentile     Phonemic Fluency (FAS) 31 (52) 58 Average    Animal Fluency 7 (31) 3 Well Below Average        NAB Language Module, Form 1: T Score Percentile     Auditory Comprehension 31 3 Well Below Average    Naming 20/31 (19) <1 Exceptionally Low       Visuospatial/Visuoconstruction:      Raw Score Percentile   Clock Drawing: 10/10 --- Within Normal Limits        Scaled Score Percentile   WAIS-IV Block Design: 8 25 Average       Mood and Personality:      Raw Score Percentile   Geriatric Depression Scale: 8 --- Within Normal Limits  Geriatric Anxiety Scale: 3 --- Minimal    Somatic 2 --- Minimal    Cognitive 1 --- Minimal    Affective 0 --- Minimal       Additional Questionnaires:      Raw Score Percentile   PROMIS Sleep Disturbance Questionnaire: 19 --- None to Slight  Informed Consent and Coding/Compliance:   The current evaluation represents a clinical evaluation for the purposes previously outlined by the referral source and is in no way reflective of a forensic evaluation.   Ms. Holster was provided with a verbal description of the nature and purpose of the present neuropsychological evaluation. Also reviewed were the foreseeable risks and/or discomforts and benefits of the procedure, limits of confidentiality, and mandatory reporting requirements of this provider. The patient was given the opportunity to ask questions and receive answers about the evaluation. Oral consent to participate was provided by the patient.   This evaluation was conducted by Newman Nickels, Ph.D., ABPP-CN, board certified clinical neuropsychologist. Ms. Caisse completed a clinical interview with Dr. Milbert Coulter, billed as one unit [phone], and 125 minutes of cognitive testing and scoring, billed as one unit [phone] and three additional units [id number]. Psychometrist Wallace Keller, B.S. assisted Dr. Milbert Coulter with test administration and scoring procedures. As a separate and discrete service, one  unit [id number] and two units [phone] were billed for Dr. Tammy Sours time spent in interpretation and report writing.

## 2023-04-14 ENCOUNTER — Encounter: Payer: Self-pay | Admitting: Psychology

## 2023-04-20 ENCOUNTER — Ambulatory Visit (INDEPENDENT_AMBULATORY_CARE_PROVIDER_SITE_OTHER): Payer: Medicare Other | Admitting: Psychology

## 2023-04-20 DIAGNOSIS — G309 Alzheimer's disease, unspecified: Secondary | ICD-10-CM

## 2023-04-20 DIAGNOSIS — G3184 Mild cognitive impairment, so stated: Secondary | ICD-10-CM

## 2023-04-20 NOTE — Progress Notes (Signed)
   Neuropsychology Feedback Session Eligha Bridegroom. [facility]  Department of Neurology  Reason for Referral:   FARIZA WINDECKER is a 72 y.o. right-handed Caucasian female referred by Marlowe Kays, PA-C, to characterize her current cognitive functioning and assist with diagnostic clarity and treatment planning in the context of subjective cognitive decline.   Feedback:   Ms. Dreis completed a comprehensive neuropsychological evaluation on 04/13/2023. Please refer to that encounter for the full report and recommendations. Briefly, results suggested severe impairment surrounding essentially all aspects of learning and memory. Additional impairments were exhibited across executive functioning, receptive language, semantic fluency, and confrontation naming. Performance variability was exhibited across processing speed and attention/concentration. Regarding the cause for ongoing cognitive impairment, my primary concerns surround Alzheimer's disease. Across memory testing, Ms. Oubre did not benefit from repeated exposure to novel information, was fully amnestic (i.e., 0% retention) across all memory tasks after a brief delay, and generally performed poorly across yes/no recognition trials. Taken together, this suggests evidence for rapid forgetting and an evolving and already fairly significant storage impairment, both of which are the hallmark testing patterns of Alzheimer's disease. Additional impairments surrounding semantic fluency, confrontation naming, and executive functioning would follow typical and expected disease progression. This would appear the most likely explanation for her pattern of deficits at the present time.  Ms. Deckelman was unaccompanied during the current feedback session. Content of the current session focused on the results of her neuropsychological evaluation. Ms. Sylva was given the opportunity to ask questions and her questions were answered. She was encouraged to  reach out should additional questions arise. A copy of her report was provided at the conclusion of the visit.      One unit [id number] was billed for Dr. Tammy Sours time spent preparing for, conducting, and documenting the current feedback session with Ms. Patzer.

## 2023-05-05 ENCOUNTER — Ambulatory Visit: Payer: Medicare Other | Admitting: Physician Assistant

## 2023-05-05 ENCOUNTER — Encounter: Payer: Self-pay | Admitting: Physician Assistant

## 2023-05-05 VITALS — BP 136/65 | HR 95 | Resp 18 | Ht 61.5 in

## 2023-05-05 DIAGNOSIS — G3184 Mild cognitive impairment, so stated: Secondary | ICD-10-CM

## 2023-05-05 DIAGNOSIS — G309 Alzheimer's disease, unspecified: Secondary | ICD-10-CM | POA: Diagnosis not present

## 2023-05-05 MED ORDER — MEMANTINE HCL 10 MG PO TABS: 10.0000 mg | ORAL_TABLET | Freq: Every evening | ORAL | 11 refills | Status: DC

## 2023-05-05 NOTE — Patient Instructions (Addendum)
It was a pleasure to see you today at our office.   Recommendations:   Follow up in 6 months   Start Memantine 10 mg nightly . Take 1 tablet at bedtime    Consider [facility]  [address], [place] [address] [phone]  Hours of Operation Mondays to Thursdays: 8 am to 8 pm,Fridays: 9 am to 8 pm, Saturdays: 9 am to 1 pm Sundays: Closed  [email or web address]    For psychiatric meds, mood meds: Please have your primary care physician manage these medications.  If you have any severe symptoms of a stroke, or other severe issues such as confusion,severe chills or fever, etc call 911 or go to the ER as you may need to be evaluated further  For guidance regarding [facility] and if placement were needed at the facility, contact Social Worker tel: [phone]  For assessment of decision of mental capacity and competency:  Call Dr. Erick Blinks, geriatric psychiatrist at [phone]  Counseling regarding caregiver distress, including caregiver depression, anxiety and issues regarding community resources, adult day care programs, adult living facilities, or memory care questions:  please contact your  Primary Doctor's Social Worker   Whom to call: Memory  decline, memory medications: Call our office [phone]    [email or web address]   RECOMMENDATIONS FOR ALL PATIENTS WITH MEMORY PROBLEMS: 1. Continue to exercise (Recommend 30 minutes of walking everyday, or 3 hours every week) 2. Increase social interactions - continue going to [place] and enjoy social gatherings with friends and family 3. Eat healthy, avoid fried foods and eat more fruits and vegetables 4. Maintain adequate blood pressure, blood sugar, and blood cholesterol level. Reducing the risk of stroke and cardiovascular disease also helps promoting  better memory. 5. Avoid stressful situations. Live a simple life and avoid aggravations. Organize your time and prepare for the next day in anticipation. 6. Sleep well, avoid any interruptions of sleep and avoid any distractions in the bedroom that may interfere with adequate sleep quality 7. Avoid sugar, avoid sweets as there is a strong link between excessive sugar intake, diabetes, and cognitive impairment We discussed the Mediterranean diet, which has been shown to help patients reduce the risk of progressive memory disorders and reduces cardiovascular risk. This includes eating fish, eat fruits and green leafy vegetables, nuts like almonds and hazelnuts, walnuts, and also use olive oil. Avoid fast foods and fried foods as much as possible. Avoid sweets and sugar as sugar use has been linked to worsening of memory function.  There is always a concern of gradual progression of memory problems. If this is the case, then we may need to adjust level of care according to patient needs. Support, both to the patient and caregiver, should then be put into place.      You have been referred for a neuropsychological evaluation (i.e., evaluation of memory and thinking abilities). Please bring someone with you to this appointment if possible, as it is helpful for the doctor to hear from both you and another adult who knows you well. Please bring eyeglasses and hearing aids if you wear them.    The evaluation will take approximately 3 hours and has two parts:   The first part is a clinical interview with the neuropsychologist (Dr. Milbert Coulter or Dr. Roseanne Reno). During the interview, the neuropsychologist will speak with you and the individual you brought to the appointment.    The second part of the evaluation is testing with the doctor's technician Annabelle Harman or  Kim). During the testing, the technician will ask you to remember different types of material, solve problems, and answer some questionnaires. Your family member  will not be present for this portion of the evaluation.   Please note: We must reserve several hours of the neuropsychologist's time and the psychometrician's time for your evaluation appointment. As such, there is a No-Show fee of $100. If you are unable to attend any of your appointments, please contact our office as soon as possible to reschedule.      DRIVING: Regarding driving, in patients with progressive memory problems, driving will be impaired. We advise to have someone else do the driving if trouble finding directions or if minor accidents are reported. Independent driving assessment is available to determine safety of driving.   If you are interested in the driving assessment, you can contact the following:  [facility] [employer] [facility] in [place] [phone]  [facility] [phone]  [facility] [phone]  [facility] [phone] or [phone]   FALL PRECAUTIONS: Be cautious when walking. Scan the area for obstacles that may increase the risk of trips and falls. When getting up in the mornings, sit up at the edge of the bed for a few minutes before getting out of bed. Consider elevating the bed at the head end to avoid drop of blood pressure when getting up. Walk always in a well-lit room (use night lights in the walls). Avoid area rugs or power cords from appliances in the middle of the walkways. Use a walker or a cane if necessary and consider physical therapy for balance exercise. Get your eyesight checked regularly.  FINANCIAL OVERSIGHT: Supervision, especially oversight when making financial decisions or transactions is also recommended.  HOME SAFETY: Consider the safety of the kitchen when operating appliances like stoves, microwave oven, and blender. Consider having supervision and share cooking responsibilities until no longer able to participate in those. Accidents with firearms and other hazards in the house should be identified  and addressed as well.   ABILITY TO BE LEFT ALONE: If patient is unable to contact 911 operator, consider using LifeLine, or when the need is there, arrange for someone to stay with patients. Smoking is a fire hazard, consider supervision or cessation. Risk of wandering should be assessed by caregiver and if detected at any point, supervision and safe proof recommendations should be instituted.  MEDICATION SUPERVISION: Inability to self-administer medication needs to be constantly addressed. Implement a mechanism to ensure safe administration of the medications.      Mediterranean Diet A Mediterranean diet refers to food and lifestyle choices that are based on the traditions of countries located on the [employer]. This way of eating has been shown to help prevent certain conditions and improve outcomes for people who have chronic diseases, like kidney disease and heart disease. What are tips for following this plan? Lifestyle  Cook and eat meals together with your family, when possible. Drink enough fluid to keep your urine clear or pale yellow. Be physically active every day. This includes: Aerobic exercise like running or swimming. Leisure activities like gardening, walking, or housework. Get 7-8 hours of sleep each night. If recommended by your health care provider, drink red wine in moderation. This means 1 glass a day for nonpregnant women and 2 glasses a day for men. A glass of wine equals 5 oz (150 mL). Reading food labels  Check the serving size of packaged foods. For foods such as rice and pasta, the serving size refers  to the amount of cooked product, not dry. Check the total fat in packaged foods. Avoid foods that have saturated fat or trans fats. Check the ingredients list for added sugars, such as corn syrup. Shopping  At the grocery store, buy most of your food from the areas near the walls of the store. This includes: Fresh fruits and vegetables (produce). Grains,  beans, nuts, and seeds. Some of these may be available in unpackaged forms or large amounts (in bulk). Fresh seafood. Poultry and eggs. Low-fat dairy products. Buy whole ingredients instead of prepackaged foods. Buy fresh fruits and vegetables in-season from local farmers markets. Buy frozen fruits and vegetables in resealable bags. If you do not have access to quality fresh seafood, buy precooked frozen shrimp or canned fish, such as tuna, salmon, or sardines. Buy small amounts of raw or cooked vegetables, salads, or olives from the deli or salad bar at your store. Stock your pantry so you always have certain foods on hand, such as olive oil, canned tuna, canned tomatoes, rice, pasta, and beans. Cooking  Cook foods with extra-virgin olive oil instead of using butter or other vegetable oils. Have meat as a side dish, and have vegetables or grains as your main dish. This means having meat in small portions or adding small amounts of meat to foods like pasta or stew. Use beans or vegetables instead of meat in common dishes like chili or lasagna. Experiment with different cooking methods. Try roasting or broiling vegetables instead of steaming or sauteing them. Add frozen vegetables to soups, stews, pasta, or rice. Add nuts or seeds for added healthy fat at each meal. You can add these to yogurt, salads, or vegetable dishes. Marinate fish or vegetables using olive oil, lemon juice, garlic, and fresh herbs. Meal planning  Plan to eat 1 vegetarian meal one day each week. Try to work up to 2 vegetarian meals, if possible. Eat seafood 2 or more times a week. Have healthy snacks readily available, such as: Vegetable sticks with hummus. Greek yogurt. Fruit and nut trail mix. Eat balanced meals throughout the week. This includes: Fruit: 2-3 servings a day Vegetables: 4-5 servings a day Low-fat dairy: 2 servings a day Fish, poultry, or lean meat: 1 serving a day Beans and legumes: 2 or more  servings a week Nuts and seeds: 1-2 servings a day Whole grains: 6-8 servings a day Extra-virgin olive oil: 3-4 servings a day Limit red meat and sweets to only a few servings a month What are my food choices? Mediterranean diet Recommended Grains: Whole-grain pasta. Brown rice. Bulgar wheat. Polenta. Couscous. Whole-wheat bread. Orpah Cobb. Vegetables: Artichokes. Beets. Broccoli. Cabbage. Carrots. Eggplant. Green beans. Chard. Kale. Spinach. Onions. Leeks. Peas. Squash. Tomatoes. Peppers. Radishes. Fruits: Apples. Apricots. Avocado. Berries. Bananas. Cherries. Dates. Figs. Grapes. Lemons. Melon. Oranges. Peaches. Plums. Pomegranate. Meats and other protein foods: Beans. Almonds. Sunflower seeds. Pine nuts. Peanuts. Cod. Salmon. Scallops. Shrimp. Tuna. Tilapia. Clams. Oysters. Eggs. Dairy: Low-fat milk. Cheese. Greek yogurt. Beverages: Water. Red wine. Herbal tea. Fats and oils: Extra virgin olive oil. Avocado oil. Grape seed oil. Sweets and desserts: [place] yogurt with honey. Baked apples. Poached pears. Trail mix. Seasoning and other foods: Basil. Cilantro. Coriander. Cumin. Mint. Parsley. Sage. Rosemary. Tarragon. Garlic. Oregano. Thyme. Pepper. Balsalmic vinegar. Tahini. Hummus. Tomato sauce. Olives. Mushrooms. Limit these Grains: Prepackaged pasta or rice dishes. Prepackaged cereal with added sugar. Vegetables: Deep fried potatoes (french fries). Fruits: Fruit canned in syrup. Meats and other protein foods: Beef. Pork. Lamb. Poultry  with skin. Hot dogs. Tomasa Blase. Dairy: Ice cream. Sour cream. Whole milk. Beverages: Juice. Sugar-sweetened soft drinks. Beer. Liquor and spirits. Fats and oils: Butter. Canola oil. Vegetable oil. Beef fat (tallow). Lard. Sweets and desserts: Cookies. Cakes. Pies. Candy. Seasoning and other foods: Mayonnaise. Premade sauces and marinades. The items listed may not be a complete list. Talk with your dietitian about what dietary choices are right for  you. Summary The Mediterranean diet includes both food and lifestyle choices. Eat a variety of fresh fruits and vegetables, beans, nuts, seeds, and whole grains. Limit the amount of red meat and sweets that you eat. Talk with your health care provider about whether it is safe for you to drink red wine in moderation. This means 1 glass a day for nonpregnant women and 2 glasses a day for men. A glass of wine equals 5 oz (150 mL). This information is not intended to replace advice given to you by your health care provider. Make sure you discuss any questions you have with your health care provider. Document Released: 01/17/2016 Document Revised: 02/19/2016 Document Reviewed: 01/17/2016 Elsevier Interactive Patient Education  2017 [employer].

## 2023-05-05 NOTE — Addendum Note (Signed)
Addended by: Marlowe Kays E on: 05/05/2023 05:14 PM   Modules accepted: Level of Service

## 2023-05-05 NOTE — Addendum Note (Signed)
Addended by: Marlowe Kays E on: 05/05/2023 05:13 PM   Modules accepted: Level of Service

## 2023-05-05 NOTE — Progress Notes (Addendum)
Assessment/Plan:   Mild Cognitive Impairment due to Alzheimer's disease   Brooke [name] is a very pleasant 72 y.o. RH female  with a history of hypertension, hyperlipidemia, anxiety, situational depression, arthritis, CAD, prediabetes, microcytic anemia, insomnia  and a diagnosis of MCI  due to Alzheimer's disease per neuropsychological evaluation seen today in follow up to discuss the MRI of the brain results. These were personally reviewed, remarkable for bifrontal superficial siderosis, greater on the right. No specificity to the pattern and mild for age cerebral volume loss.  Patient is currently on memantine 5 mg at night. Discussed increasing the dose, but she may forget the morning dose. Due to cost, Namenda XR is unaffordable (160 dollars a month). Will order memantine 10 mg at night for now and monitor.  This patient is here alone.  Recent MoCA 16/30. Previous records as well as any outside records available were reviewed prior to todays visit.     Follow up in 6 months. Increase memantine to 10 mg qhs for now, if compliant, will increase dose to bid. Side effects discussed  Repeat Neuropsych evaluation in 1 year for clarity of diagnosis  Continue to control mood as per PCP Recommend good control of cardiovascular risk factors  Neuropsych evaluation 04/2023 Briefly, results suggested severe impairment surrounding essentially all aspects of learning and memory. Additional impairments were exhibited across executive functioning, receptive language, semantic fluency, and confrontation naming. Performance variability was exhibited across processing speed and attention/concentration. Regarding the cause for ongoing cognitive impairment, my primary concerns surround Alzheimer's disease. Across memory testing, Brooke [name] did not benefit from repeated exposure to novel information, was fully amnestic (i.e., 0% retention) across all memory tasks after a brief delay, and generally performed  poorly across yes/no recognition trials. Taken together, this suggests evidence for rapid forgetting and an evolving and already fairly significant storage impairment, both of which are the hallmark testing patterns of Alzheimer's disease. Additional impairments surrounding semantic fluency, confrontation naming, and executive functioning would follow typical and expected disease progression. This would appear the most likely explanation for her pattern of deficits at the present time.   CURRENT MEDICATIONS:  Outpatient Encounter Medications as of 05/05/2023  Medication Sig   amLODipine (NORVASC) 10 MG tablet Take 1 tablet by mouth once daily   aspirin 81 MG chewable tablet Chew 81 mg by mouth daily.   atenolol (TENORMIN) 25 MG tablet Take 1 tablet by mouth once daily   atorvastatin (LIPITOR) 20 MG tablet Take 1 tablet (20 mg total) by mouth daily.   famotidine (PEPCID) 40 MG tablet Take 1 tablet (40 mg total) by mouth daily as needed for heartburn or indigestion.   potassium chloride (KLOR-CON) 10 MEQ tablet TAKE 1 TABLET BY MOUTH THREE TIMES DAILY   [DISCONTINUED] memantine (NAMENDA) 5 MG tablet Take 1 tablet (5 mg total) by mouth at bedtime.   memantine (NAMENDA) 10 MG tablet Take 1 tablet (10 mg total) by mouth at bedtime.   [DISCONTINUED] potassium chloride (KLOR-CON M10) 10 MEQ tablet Take 1 tablet (10 mEq total) by mouth three times daily.   No facility-administered encounter medications on file as of 05/05/2023.       08/21/2016    3:01 PM  MMSE - Mini Mental State Exam  Not completed: Refused      02/11/2023   11:00 AM  Montreal Cognitive Assessment   Visuospatial/ Executive (0/5) 3  Naming (0/3) 3  Attention: Read list of digits (0/2) 2  Attention: Read list of  letters (0/1) 1  Attention: Serial 7 subtraction starting at 100 (0/3) 1  Language: Repeat phrase (0/2) 1  Language : Fluency (0/1) 0  Abstraction (0/2) 0  Delayed Recall (0/5) 0  Orientation (0/6) 3  Total 14   Adjusted Score (based on education) 15   Thank you for allowing [place] the opportunity to participate in the care of this nice patient. Please do not hesitate to contact us for any questions or concerns.   Total time spent on today's visit was dedicated to this patient today, preparing to see patient, examining the patient, ordering tests and/or medications and counseling the patient, documenting clinical information in the EHR or other health record, independently interpreting results and communicating results to the patient/family, discussing treatment and goals, answering patient's questions and coordinating care.  Cc:  Brooke Sanes, MD  Brooke [name] 05/05/2023 4:59 PM

## 2023-05-27 ENCOUNTER — Other Ambulatory Visit: Payer: Self-pay | Admitting: Internal Medicine

## 2023-07-02 ENCOUNTER — Other Ambulatory Visit: Payer: Self-pay | Admitting: Internal Medicine

## 2023-07-27 ENCOUNTER — Ambulatory Visit: Payer: Medicare Other | Admitting: Family Medicine

## 2023-07-27 NOTE — Progress Notes (Deleted)
   Acute Office Visit  Subjective:     Patient ID: Brooke [name], female    DOB: 10-10-1950, 73 y.o.   MRN: [id number]  No chief complaint on file.   HPI Patient is in today for evaluation of ***, for the last ***. Has tried Denies known sick contacts, Denies abdominal pain, nausea, vomiting, diarrhea, rash, fever, chills, other symptoms.  Medical hx as outlined below.  ROS Per HPI      Objective:    There were no vitals taken for this visit.   Physical Exam Vitals and nursing note reviewed.  HENT:     Head: Normocephalic and atraumatic.     Nose: No congestion.     Mouth/Throat:     Mouth: Mucous membranes are moist.     Pharynx: Oropharynx is clear. No oropharyngeal exudate or posterior oropharyngeal erythema.  Eyes:     Extraocular Movements: Extraocular movements intact.  Cardiovascular:     Rate and Rhythm: Normal rate and regular rhythm.  Pulmonary:     Effort: Pulmonary effort is normal. No respiratory distress.  Musculoskeletal:     Cervical back: Normal range of motion and neck supple.  Lymphadenopathy:     Cervical: No cervical adenopathy.  Skin:    General: Skin is warm and dry.  Neurological:     Mental Status: She is alert.   No results found for any visits on 07/27/23.      Assessment & Plan:  ***  No orders of the defined types were placed in this encounter.   No follow-ups on file.  Moshe Cipro, FNP

## 2023-07-28 ENCOUNTER — Ambulatory Visit: Payer: Medicare Other | Admitting: Family Medicine

## 2023-07-29 ENCOUNTER — Ambulatory Visit: Payer: Medicare Other | Admitting: Internal Medicine

## 2023-07-30 ENCOUNTER — Ambulatory Visit: Payer: Medicare Other | Admitting: Family Medicine

## 2023-07-30 ENCOUNTER — Encounter: Payer: Self-pay | Admitting: Family Medicine

## 2023-07-30 ENCOUNTER — Ambulatory Visit: Payer: Medicare Other | Admitting: Emergency Medicine

## 2023-07-30 VITALS — BP 102/64 | HR 60 | Temp 97.6°F | Ht 61.5 in | Wt 152.0 lb

## 2023-07-30 DIAGNOSIS — L309 Dermatitis, unspecified: Secondary | ICD-10-CM | POA: Diagnosis not present

## 2023-07-30 DIAGNOSIS — R21 Rash and other nonspecific skin eruption: Secondary | ICD-10-CM

## 2023-07-30 NOTE — Progress Notes (Signed)
 Subjective:     Patient ID: Brooke [name], female    DOB: July 03, 1950, 73 y.o.   MRN: [id number]  Chief Complaint  Patient presents with   Rash    Neck and both arms. First noticed a couple days ago. Denies itching or pain    Rash Pertinent negatives include no congestion, cough, diarrhea, fever, joint pain, shortness of breath or vomiting.    History of Present Illness         C/o rash x 1 wk on her chest and forearm. Denies itching.   Denies any changes in lotions, detergents, soaps, medications.  No new clothing or pets.  History of eczema.  Denies using anything for symptoms   Health Maintenance Due  Topic Date Due   Zoster Vaccines- Shingrix (1 of 2) 04/17/1999   Medicare Annual Wellness (AWV)  08/21/2017   DTaP/Tdap/Td (3 - Tdap) 03/30/2018   COVID-19 Vaccine (4 - 2024-25 season) 02/08/2023    Past Medical History:  Diagnosis Date   Allergic rhinitis 02/22/2007   Eczema 03/25/2021   Essential hypertension 02/18/2007   GERD (gastroesophageal reflux disease) 06/01/2019   Hyperlipidemia 07/22/2007   Insomnia 04/15/2016   Trazodone not effective  Mirtazapine  15 mg not effective     Lipoma of chest wall 07/23/2022   Major depressive disorder 09/04/2020   Microcytic anemia 10/12/2013   Electrophoresis normal     Mild cognitive impairment due to Alzheimer's disease 04/13/2023   Obesity    Osteoarthritis 03/30/2008   Osteopenia 08/25/2022   08/25/2022 spine -1.5, RFN -1.4, FRAX is low     Prediabetes 05/07/2015    Past Surgical History:  Procedure Laterality Date   CARPAL TUNNEL RELEASE     bilateral   HIP SURGERY      Family History  Problem Relation Age of Onset   CAD Sister    CAD Father    Kidney disease Sister    Lung cancer Mother    Brain cancer Mother    Colon cancer Neg Hx    Stomach cancer Neg Hx    Pancreatic cancer Neg Hx    Esophageal cancer Neg Hx    Colon polyps Neg Hx    Rectal cancer Neg Hx     Social History    Socioeconomic History   Marital status: Married    Spouse name: Not on file   Number of children: 2   Years of education: 12   Highest education level: High school graduate  Occupational History   Occupation: Retired    Comment: Glass blower/designer; grocery store  Tobacco Use   Smoking status: Never   Smokeless tobacco: Never  Vaping Use   Vaping status: Never Used  Substance and Sexual Activity   Alcohol use: No   Drug use: No   Sexual activity: Not on file  Other Topics Concern   Not on file  Social History Narrative   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     Right handed    Social Drivers of Health   Financial Resource Strain: Low Risk  (03/14/2020)   Overall Financial Resource Strain (CARDIA)    Difficulty of Paying Living Expenses: Not hard at all  Food Insecurity: Not on file  Transportation Needs: Not on file  Physical Activity: Not on file  Stress: Not on file  Social Connections: Not on file  Intimate Partner Violence: Not on file    Outpatient Medications Prior to Visit  Medication Sig  Dispense Refill   amLODipine (NORVASC) 10 MG tablet Take 1 tablet by mouth once daily 90 tablet 0   aspirin 81 MG chewable tablet Chew 81 mg by mouth daily.     atenolol (TENORMIN) 25 MG tablet Take 1 tablet by mouth once daily 90 tablet 0   atorvastatin (LIPITOR) 20 MG tablet Take 1 tablet by mouth once daily 90 tablet 0   famotidine (PEPCID) 40 MG tablet Take 1 tablet (40 mg total) by mouth daily as needed for heartburn or indigestion. 30 tablet 5   memantine (NAMENDA) 10 MG tablet Take 1 tablet (10 mg total) by mouth at bedtime. 30 tablet 11   potassium chloride (KLOR-CON) 10 MEQ tablet TAKE 1 TABLET BY MOUTH THREE TIMES DAILY 270 tablet 1   No facility-administered medications prior to visit.    Allergies  Allergen Reactions   Penicillins Hives    Review of Systems  Constitutional:  Negative for chills, fever and malaise/fatigue.  HENT:   Negative for congestion.   Respiratory:  Negative for cough, shortness of breath and wheezing.   Cardiovascular:  Negative for chest pain, palpitations and leg swelling.  Gastrointestinal:  Negative for abdominal pain, constipation, diarrhea, nausea and vomiting.  Genitourinary:  Negative for dysuria, frequency and urgency.  Musculoskeletal:  Negative for joint pain and myalgias.  Skin:  Positive for rash. Negative for itching.  Neurological:  Negative for dizziness, focal weakness and headaches.       Objective:    Physical Exam Constitutional:      General: She is not in acute distress.    Appearance: She is not ill-appearing.  HENT:     Nose: Nose normal.     Mouth/Throat:     Mouth: Mucous membranes are moist.     Pharynx: Oropharynx is clear.  Eyes:     Extraocular Movements: Extraocular movements intact.     Conjunctiva/sclera: Conjunctivae normal.  Cardiovascular:     Rate and Rhythm: Normal rate.  Pulmonary:     Effort: Pulmonary effort is normal.     Breath sounds: Normal breath sounds.  Musculoskeletal:     Cervical back: Normal range of motion and neck supple. No tenderness.  Lymphadenopathy:     Cervical: No cervical adenopathy.  Skin:    General: Skin is warm and dry.     Findings: Rash present.     Comments: Dry patch of skin on anterior chest wall and left forearm.  No erythema, pustules, vesicles and no sign of infection.  Neurological:     General: No focal deficit present.     Mental Status: She is alert.  Psychiatric:        Mood and Affect: Mood normal.        Behavior: Behavior normal.        Thought Content: Thought content normal.      BP 102/64 (BP Location: Left Arm, Patient Position: Sitting)   Pulse 60   Temp 97.6 F (36.4 C) (Temporal)   Ht 5' 1.5" (1.562 m)   Wt 152 lb (68.9 kg)   SpO2 97%   BMI 28.26 kg/m  Wt Readings from Last 3 Encounters:  07/30/23 152 lb (68.9 kg)  03/18/23 150 lb (68 kg)  02/18/23 156 lb 9.6 oz (71 kg)        Assessment & Plan:   Problem List Items Addressed This Visit     Eczema   Other Visit Diagnoses       Rash and nonspecific skin  eruption    -  Primary      We discussed using a thick moisturizer such as Aveeno or CeraVe over-the-counter at least twice daily.  If she develops any itching, she will try over-the-counter hydrocortisone.  She does have a history of eczema.  Otherwise at baseline today.  Follow-up as needed.  I am having Joellyn Rued maintain her aspirin, potassium chloride, famotidine, amLODipine, memantine, atenolol, and atorvastatin.  No orders of the defined types were placed in this encounter.

## 2023-07-30 NOTE — Patient Instructions (Signed)
 Try using a thick moisturizer over-the-counter such as CeraVe or Aveeno.  Apply this after bathing or showering and again before bedtime.  If you develop any worsening rash or if you notice itching, you may use over-the-counter hydrocortisone for 1 to 2 weeks at most and then stop.  Follow-up if you have any concerns

## 2023-08-04 ENCOUNTER — Ambulatory Visit: Payer: Medicare Other | Admitting: Emergency Medicine

## 2023-08-13 ENCOUNTER — Other Ambulatory Visit: Payer: Self-pay | Admitting: Nurse Practitioner

## 2023-08-13 DIAGNOSIS — T63301A Toxic effect of unspecified spider venom, accidental (unintentional), initial encounter: Secondary | ICD-10-CM

## 2023-08-17 NOTE — Patient Instructions (Addendum)
      Blood work was ordered.       Medications changes include :   None    A referral was ordered and someone will call you to schedule an appointment.     Return in about 6 months (around 02/18/2024) for Physical Exam.

## 2023-08-17 NOTE — Progress Notes (Unsigned)
 Subjective:    Patient ID: Brooke [name], female    DOB: 1950/07/07, 73 y.o.   MRN: [id number]     HPI Brooke [name] is here for follow up of her chronic medical problems.  Here with her granddaughter.  Eating ok.  Does eat fast food a lot per granddaughter.  She does cook a little.  Not exercising much.  Her granddaughter is trying to get [facility] her house, but she has not wanted to go.  She does stay home a lot.  She does do some puzzles on paper.  Will only drive short distances.  Family does have an air tag in her car.  She sometimes cannot find her car when she comes out of the store.  Medications and allergies reviewed with patient and updated if appropriate.  Current Outpatient Medications on File Prior to Visit  Medication Sig Dispense Refill   amLODipine (NORVASC) 10 MG tablet Take 1 tablet by mouth once daily 90 tablet 0   aspirin 81 MG chewable tablet Chew 81 mg by mouth daily.     atenolol (TENORMIN) 25 MG tablet Take 1 tablet by mouth once daily 90 tablet 0   atorvastatin (LIPITOR) 20 MG tablet Take 1 tablet by mouth once daily 90 tablet 0   famotidine (PEPCID) 40 MG tablet Take 1 tablet (40 mg total) by mouth daily as needed for heartburn or indigestion. 30 tablet 5   memantine (NAMENDA) 10 MG tablet Take 1 tablet (10 mg total) by mouth at bedtime. 30 tablet 11   potassium chloride (KLOR-CON) 10 MEQ tablet TAKE 1 TABLET BY MOUTH THREE TIMES DAILY 270 tablet 1   [DISCONTINUED] potassium chloride (KLOR-CON M10) 10 MEQ tablet Take 1 tablet (10 mEq total) by mouth three times daily. 90 tablet 0   No current facility-administered medications on file prior to visit.     Review of Systems  Constitutional:  Negative for appetite change and fever.  Respiratory:  Negative for cough, shortness of breath and wheezing.   Cardiovascular:  Negative for chest pain, palpitations and leg swelling.  Gastrointestinal:  Negative for abdominal pain.        No gerd  Neurological:  Negative for light-headedness and headaches.  Psychiatric/Behavioral:  Negative for dysphoric mood and sleep disturbance. The patient is not nervous/anxious.        Objective:   Vitals:   08/18/23 0815  BP: 116/68  Pulse: 62  Temp: 98.1 F (36.7 C)  SpO2: 98%   BP Readings from Last 3 Encounters:  08/18/23 116/68  07/30/23 102/64  05/05/23 136/65   Wt Readings from Last 3 Encounters:  08/18/23 155 lb (70.3 kg)  07/30/23 152 lb (68.9 kg)  03/18/23 150 lb (68 kg)   Body mass index is 28.81 kg/m.    Physical Exam Constitutional:      General: She is not in acute distress.    Appearance: Normal appearance.  HENT:     Head: Normocephalic and atraumatic.  Eyes:     Conjunctiva/sclera: Conjunctivae normal.  Cardiovascular:     Rate and Rhythm: Normal rate and regular rhythm.     Heart sounds: Normal heart sounds.  Pulmonary:     Effort: Pulmonary effort is normal. No respiratory distress.     Breath sounds: Normal breath sounds. No wheezing.  Musculoskeletal:     Cervical back: Neck supple.     Right lower leg: No edema.  Left lower leg: No edema.  Lymphadenopathy:     Cervical: No cervical adenopathy.  Skin:    General: Skin is warm and dry.     Findings: No rash.  Neurological:     Mental Status: She is alert. Mental status is at baseline.  Psychiatric:        Mood and Affect: Mood normal.        Behavior: Behavior normal.        Lab Results  Component Value Date   WBC 8.7 11/20/2021   HGB 12.4 11/20/2021   HCT 38.7 11/20/2021   PLT 211.0 11/20/2021   GLUCOSE 86 11/12/2022   CHOL 164 11/12/2022   TRIG 79.0 11/12/2022   HDL 68.90 11/12/2022   LDLDIRECT 162.0 08/05/2012   LDLCALC 79 11/12/2022   ALT 21 11/12/2022   AST 23 11/12/2022   NA 140 11/12/2022   K 3.9 11/12/2022   CL 105 11/12/2022   CREATININE 0.92 11/12/2022   BUN 19 11/12/2022   CO2 23 11/12/2022   TSH 1.41 02/11/2023   INR 1.9 (H) 04/15/2008    HGBA1C 5.6 11/12/2022   MICROALBUR 24.2 (H) 03/19/2009     Assessment & Plan:    See Problem List for Assessment and Plan of chronic medical problems.

## 2023-08-18 ENCOUNTER — Ambulatory Visit: Payer: Medicare Other | Admitting: Internal Medicine

## 2023-08-18 ENCOUNTER — Ambulatory Visit (INDEPENDENT_AMBULATORY_CARE_PROVIDER_SITE_OTHER): Payer: Medicare Other | Admitting: Internal Medicine

## 2023-08-18 ENCOUNTER — Encounter: Payer: Self-pay | Admitting: Internal Medicine

## 2023-08-18 VITALS — BP 116/68 | HR 62 | Temp 98.1°F | Ht 61.5 in | Wt 155.0 lb

## 2023-08-18 DIAGNOSIS — M8589 Other specified disorders of bone density and structure, multiple sites: Secondary | ICD-10-CM | POA: Diagnosis not present

## 2023-08-18 DIAGNOSIS — K219 Gastro-esophageal reflux disease without esophagitis: Secondary | ICD-10-CM

## 2023-08-18 DIAGNOSIS — I10 Essential (primary) hypertension: Secondary | ICD-10-CM

## 2023-08-18 DIAGNOSIS — G309 Alzheimer's disease, unspecified: Secondary | ICD-10-CM

## 2023-08-18 DIAGNOSIS — E7849 Other hyperlipidemia: Secondary | ICD-10-CM | POA: Diagnosis not present

## 2023-08-18 DIAGNOSIS — R7303 Prediabetes: Secondary | ICD-10-CM

## 2023-08-18 DIAGNOSIS — G3184 Mild cognitive impairment, so stated: Secondary | ICD-10-CM

## 2023-08-18 LAB — COMPREHENSIVE METABOLIC PANEL
ALT: 30 U/L (ref 0–35)
AST: 24 U/L (ref 0–37)
Albumin: 3.9 g/dL (ref 3.5–5.2)
Alkaline Phosphatase: 91 U/L (ref 39–117)
BUN: 18 mg/dL (ref 6–23)
CO2: 29 meq/L (ref 19–32)
Calcium: 10 mg/dL (ref 8.4–10.5)
Chloride: 105 meq/L (ref 96–112)
Creatinine, Ser: 1.01 mg/dL (ref 0.40–1.20)
GFR: 54.91 mL/min — ABNORMAL LOW (ref >60.00)
Glucose, Bld: 118 mg/dL — ABNORMAL HIGH (ref 70–99)
Potassium: 4.1 meq/L (ref 3.5–5.1)
Sodium: 141 meq/L (ref 135–145)
Total Bilirubin: 0.7 mg/dL (ref 0.2–1.2)
Total Protein: 6.6 g/dL (ref 6.0–8.3)

## 2023-08-18 LAB — CBC WITH DIFFERENTIAL/PLATELET
Basophils Absolute: 0.1 10*3/uL (ref 0.0–0.1)
Basophils Relative: 1.7 % (ref 0.0–3.0)
Eosinophils Absolute: 0.2 10*3/uL (ref 0.0–0.7)
Eosinophils Relative: 2.8 % (ref 0.0–5.0)
HCT: 38.6 % (ref 36.0–46.0)
Hemoglobin: 12 g/dL (ref 12.0–15.0)
Lymphocytes Relative: 24.6 % (ref 12.0–46.0)
Lymphs Abs: 1.5 10*3/uL (ref 0.7–4.0)
MCHC: 31.1 g/dL (ref 30.0–36.0)
MCV: 75.8 fl — ABNORMAL LOW (ref 78.0–100.0)
Monocytes Absolute: 0.5 10*3/uL (ref 0.1–1.0)
Monocytes Relative: 8.3 % (ref 3.0–12.0)
Neutro Abs: 3.7 10*3/uL (ref 1.4–7.7)
Neutrophils Relative %: 62.6 % (ref 43.0–77.0)
Platelets: 192 10*3/uL (ref 150.0–400.0)
RBC: 5.09 Mil/uL (ref 3.87–5.11)
RDW: 15.9 % — ABNORMAL HIGH (ref 11.5–15.5)
WBC: 6 10*3/uL (ref 4.0–10.5)

## 2023-08-18 LAB — LIPID PANEL
Cholesterol: 149 mg/dL (ref 0–200)
HDL: 68.8 mg/dL (ref >39.00)
LDL Cholesterol: 66 mg/dL (ref 0–99)
NonHDL: 80.52
Total CHOL/HDL Ratio: 2
Triglycerides: 72 mg/dL (ref 0.0–149.0)
VLDL: 14.4 mg/dL (ref 0.0–40.0)

## 2023-08-18 LAB — HEMOGLOBIN A1C: Hgb A1c MFr Bld: 5.8 % (ref 4.6–6.5)

## 2023-08-18 LAB — VITAMIN D 25 HYDROXY (VIT D DEFICIENCY, FRACTURES): VITD: 25.35 ng/mL — ABNORMAL LOW (ref 30.00–100.00)

## 2023-08-18 NOTE — Assessment & Plan Note (Signed)
 Chronic Lab Results  Component Value Date   LDLCALC 79 11/12/2022   Lipids controlled Check lipid panel Encouraged healthy diet and regular exercise Continue atorvastatin 20 mg daily

## 2023-08-18 NOTE — Assessment & Plan Note (Signed)
 Chronic Blood pressure controlled CBC, CMP Continue amlodipine 10 mg daily, atenolol 25 mg daily

## 2023-08-18 NOTE — Assessment & Plan Note (Addendum)
 Chronic Following with neurology-Sara Wertman On Namenda 10 mg nightly Encouraged regular exercise Mood is good-denies any depression or anxiety Does minimal exercise-encouraged her to go to the [facility] or to be more active.  Family is looking into an adult daycare Encouraged continuing puzzles and brain exercises Only driving short distances, but does have an air tag in her car Sometimes cannot find her car when she comes out of the store-to keep her driving I will give her a handicap placard-encouraged her to use the handicap spaces so that they are close and she can easily find her car-family is monitoring her driving

## 2023-08-18 NOTE — Assessment & Plan Note (Signed)
Chronic Controlled Continue pepcid 40 mg daily prn

## 2023-08-18 NOTE — Assessment & Plan Note (Signed)
 Chronic DEXA up-to-date Encouraged regular exercise Advised taking calcium and vitamin D pill daily Check vitamin D level

## 2023-08-18 NOTE — Assessment & Plan Note (Signed)
 Chronic Lab Results  Component Value Date   HGBA1C 5.6 11/12/2022   Check A1c Low sugar / carb diet Stressed regular exercise

## 2023-08-21 ENCOUNTER — Telehealth: Payer: Self-pay | Admitting: Internal Medicine

## 2023-08-21 NOTE — Telephone Encounter (Signed)
 Copied from CRM [id number]. Topic: Clinical - Medical Advice >> Aug 21, 2023 11:46 AM Efraim Kaufmann C wrote: Reason for CRM: patient received call from clinic and missed it. Would like a call back please as I don't see any notes in her chart that reflect a call today. Thank you.  ---  Please call pt back about labs

## 2023-08-21 NOTE — Telephone Encounter (Signed)
Results given today 

## 2023-08-25 ENCOUNTER — Other Ambulatory Visit: Payer: Self-pay | Admitting: Internal Medicine

## 2023-09-02 ENCOUNTER — Ambulatory Visit: Payer: Self-pay

## 2023-09-02 ENCOUNTER — Institutional Professional Consult (permissible substitution): Payer: Medicare Other | Admitting: Psychology

## 2023-09-05 ENCOUNTER — Other Ambulatory Visit: Payer: Self-pay | Admitting: Nurse Practitioner

## 2023-09-05 ENCOUNTER — Other Ambulatory Visit: Payer: Self-pay | Admitting: Internal Medicine

## 2023-09-05 DIAGNOSIS — T63301A Toxic effect of unspecified spider venom, accidental (unintentional), initial encounter: Secondary | ICD-10-CM

## 2023-09-08 NOTE — Telephone Encounter (Signed)
 Called but patient could not talk bc she had someone on the other line.  Asked her to call us back to discuss request we rec'd for refill of omeprazole 20 mg once daily.  Is she still taking?  We haven't seen her since 12-2021 (Dr. Myrtie Neither)  She needs an appointment w/ [facility] or [facility] in [place], [place] or [place])

## 2023-09-09 ENCOUNTER — Encounter: Payer: Medicare Other | Admitting: Psychology

## 2023-09-09 ENCOUNTER — Ambulatory Visit: Payer: Medicare Other | Admitting: Physician Assistant

## 2023-09-11 NOTE — Telephone Encounter (Signed)
 Attempted to contact patient and was unable to leave a voicemail for patient to return call.

## 2023-09-16 NOTE — Telephone Encounter (Signed)
 Contacted patient and patient was scheduled for 10/14/23 with Victorino Dike and medication was refilled.

## 2023-09-17 ENCOUNTER — Other Ambulatory Visit: Payer: Self-pay | Admitting: Internal Medicine

## 2023-09-28 ENCOUNTER — Other Ambulatory Visit: Payer: Self-pay | Admitting: Internal Medicine

## 2023-10-09 ENCOUNTER — Ambulatory Visit: Admitting: Internal Medicine

## 2023-10-14 ENCOUNTER — Encounter: Payer: Self-pay | Admitting: Physician Assistant

## 2023-10-14 ENCOUNTER — Ambulatory Visit (INDEPENDENT_AMBULATORY_CARE_PROVIDER_SITE_OTHER): Admitting: Physician Assistant

## 2023-10-14 VITALS — BP 120/68 | HR 57 | Ht 61.0 in | Wt 154.0 lb

## 2023-10-14 DIAGNOSIS — K219 Gastro-esophageal reflux disease without esophagitis: Secondary | ICD-10-CM | POA: Diagnosis not present

## 2023-10-14 MED ORDER — FAMOTIDINE 40 MG PO TABS: 40.0000 mg | ORAL_TABLET | Freq: Every day | ORAL | 3 refills | Status: AC | PRN

## 2023-10-14 MED ORDER — OMEPRAZOLE 20 MG PO CPDR: 20.0000 mg | DELAYED_RELEASE_CAPSULE | Freq: Every day | ORAL | 3 refills | Status: AC

## 2023-10-14 NOTE — Progress Notes (Signed)
 Chief Complaint: Follow-up GERD  HPI:    Brooke [name] is a 73 year old female, known to Dr. Dominic Friendly, with a past medical history as listed below, who was referred to me by Colene Dauphin, MD for a complaint of GERD.      10/10/2019 colonoscopy with 1 diminutive polyp in the proximal transverse colon otherwise normal.    08/09/2021 EGD with normal esophagus, gastric mucosal atrophy, otherwise normal.    12/11/2021 office visit with Dr. Dominic Friendly for bowel habit irregularity.  At that time Mrs. Thought related to iron.  She had chronic microcytic anemia with normal iron levels and it was recommended she stop taking the iron.  No further evaluation was recommended.    Today, the patient presents to clinic and she is with her nephew.  She tells me that she is not really sure why she is here.  She is on some medicine for reflux which works well for her Omeprazole  20 mg daily and Pepcid  40 mg daily.  She has no other GI complaints or concerns.    Denies fever, chills or weight loss.  Past Medical History:  Diagnosis Date   Allergic rhinitis 02/22/2007   Eczema 03/25/2021   Essential hypertension 02/18/2007   GERD (gastroesophageal reflux disease) 06/01/2019   Hyperlipidemia 07/22/2007   Insomnia 04/15/2016   Trazodone  not effective  Mirtazapine   15 mg not effective     Lipoma of chest wall 07/23/2022   Major depressive disorder 09/04/2020   Microcytic anemia 10/12/2013   Electrophoresis normal     Mild cognitive impairment due to Alzheimer's disease 04/13/2023   Obesity    Osteoarthritis 03/30/2008   Osteopenia 08/25/2022   08/25/2022 spine -1.5, RFN -1.4, FRAX is low     Prediabetes 05/07/2015    Past Surgical History:  Procedure Laterality Date   CARPAL TUNNEL RELEASE     bilateral   HIP SURGERY      Current Outpatient Medications  Medication Sig Dispense Refill   omeprazole  (PRILOSEC) 20 MG capsule Take 1 capsule by mouth once daily 30 capsule 0   amLODipine  (NORVASC ) 10 MG tablet Take 1  tablet by mouth once daily 90 tablet 0   aspirin  81 MG chewable tablet Chew 81 mg by mouth daily.     atenolol  (TENORMIN ) 25 MG tablet Take 1 tablet by mouth once daily 90 tablet 0   atorvastatin  (LIPITOR) 20 MG tablet Take 1 tablet by mouth once daily 90 tablet 0   famotidine  (PEPCID ) 40 MG tablet Take 1 tablet (40 mg total) by mouth daily as needed for heartburn or indigestion. 30 tablet 5   memantine  (NAMENDA ) 10 MG tablet Take 1 tablet (10 mg total) by mouth at bedtime. 30 tablet 11   potassium chloride  (KLOR-CON ) 10 MEQ tablet TAKE 1 TABLET BY MOUTH THREE TIMES DAILY 270 tablet 1   No current facility-administered medications for this visit.    Allergies as of 10/14/2023 - Review Complete 08/18/2023  Allergen Reaction Noted   Penicillins Hives 02/18/2007    Family History  Problem Relation Age of Onset   CAD Sister    CAD Father    Kidney disease Sister    Lung cancer Mother    Brain cancer Mother    Colon cancer Neg Hx    Stomach cancer Neg Hx    Pancreatic cancer Neg Hx    Esophageal cancer Neg Hx    Colon polyps Neg Hx    Rectal cancer Neg Hx  Social History   Socioeconomic History   Marital status: Married    Spouse name: Not on file   Number of children: 2   Years of education: 12   Highest education level: High school graduate  Occupational History   Occupation: Retired    Comment: Glass blower/designer; grocery store  Tobacco Use   Smoking status: Never   Smokeless tobacco: Never  Vaping Use   Vaping status: Never Used  Substance and Sexual Activity   Alcohol use: No   Drug use: No   Sexual activity: Not on file  Other Topics Concern   Not on file  Social History Narrative   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     Right handed    Social Drivers of Health   Financial Resource Strain: Low Risk  (03/14/2020)   Overall Financial Resource Strain (CARDIA)    Difficulty of Paying Living Expenses: Not hard at all  Food Insecurity:  Not on file  Transportation Needs: Not on file  Physical Activity: Not on file  Stress: Not on file  Social Connections: Not on file  Intimate Partner Violence: Not on file    Review of Systems:    Constitutional: No weight loss, fever or chills Cardiovascular: No chest pain  Respiratory: No SOB  Gastrointestinal: See HPI and otherwise negative   Physical Exam:  Vital signs: BP 120/68   Pulse (!) 57   Ht 5\' 1"  (1.549 m)   Wt 154 lb (69.9 kg)   BMI 29.10 kg/m    Constitutional:   Pleasant elderly AA female appears to be in NAD, Well developed, Well nourished, alert and cooperative Respiratory: Respirations even and unlabored. Lungs clear to auscultation bilaterally.   No wheezes, crackles, or rhonchi.  Cardiovascular: Normal S1, S2. No MRG. Regular rate and rhythm. No peripheral edema, cyanosis or pallor.  Gastrointestinal:  Soft, nondistended, nontender. No rebound or guarding. Normal bowel sounds. No appreciable masses or hepatomegaly. Rectal:  Not performed.  Psychiatric: Demonstrates good judgement and reason without abnormal affect or behaviors.  RELEVANT LABS AND IMAGING: CBC    Component Value Date/Time   WBC 6.0 08/18/2023 0859   RBC 5.09 08/18/2023 0859   HGB 12.0 08/18/2023 0859   HCT 38.6 08/18/2023 0859   PLT 192.0 08/18/2023 0859   MCV 75.8 (L) 08/18/2023 0859   MCH 23.7 (L) 05/01/2021 1435   MCHC 31.1 08/18/2023 0859   RDW 15.9 (H) 08/18/2023 0859   LYMPHSABS 1.5 08/18/2023 0859   MONOABS 0.5 08/18/2023 0859   EOSABS 0.2 08/18/2023 0859   BASOSABS 0.1 08/18/2023 0859    CMP     Component Value Date/Time   NA 141 08/18/2023 0859   K 4.1 08/18/2023 0859   CL 105 08/18/2023 0859   CO2 29 08/18/2023 0859   GLUCOSE 118 (H) 08/18/2023 0859   BUN 18 08/18/2023 0859   CREATININE 1.01 08/18/2023 0859   CALCIUM  10.0 08/18/2023 0859   PROT 6.6 08/18/2023 0859   ALBUMIN 3.9 08/18/2023 0859   AST 24 08/18/2023 0859   ALT 30 08/18/2023 0859   ALKPHOS 91  08/18/2023 0859   BILITOT 0.7 08/18/2023 0859   GFRNONAA 46 (L) 05/01/2021 1435   GFRAA >90 04/27/2012 1213    Assessment: 1.  GERD: Well-controlled on Omeprazole  20 mg daily and Famotidine  40 mg nightly  Plan: 1.  Refill Omeprazole  20 mg daily, 30-60 minutes before breakfast #90 with 3 refills 2.  Refill Famotidine  40 mg nightly #  90 with 3 refills 3.  Discussed with patient that if she is doing well and symptoms are controlled then she can continue on this dosing for another year.  She would need to be seen in 2 years if needs further refills after that.  Reginal Capra, PA-C [facility] 10/14/2023, 8:29 AM  Cc: Colene Dauphin, MD

## 2023-10-14 NOTE — Patient Instructions (Signed)
 We have sent the following medications to your pharmacy for you to pick up at your convenience: Omeprazole  and Famotidine .   _______________________________________________________  If your blood pressure at your visit was 140/90 or greater, please contact your primary care physician to follow up on this.  _______________________________________________________  If you are age 75 or older, your body mass index should be between 23-30. Your Body mass index is 29.1 kg/m. If this is out of the aforementioned range listed, please consider follow up with your Primary Care Provider.  If you are age 30 or younger, your body mass index should be between 19-25. Your Body mass index is 29.1 kg/m. If this is out of the aformentioned range listed, please consider follow up with your Primary Care Provider.   ________________________________________________________  The [facility] providers would like to encourage you to use MYCHART to communicate with providers for non-urgent requests or questions.  Due to long hold times on the telephone, sending your provider a message by [facility] may be a faster and more efficient way to get a response.  Please allow 48 business hours for a response.  Please remember that this is for non-urgent requests.  _______________________________________________________

## 2023-10-15 ENCOUNTER — Other Ambulatory Visit: Payer: Self-pay | Admitting: Internal Medicine

## 2023-10-15 DIAGNOSIS — R921 Mammographic calcification found on diagnostic imaging of breast: Secondary | ICD-10-CM

## 2023-10-26 ENCOUNTER — Ambulatory Visit: Payer: Self-pay

## 2023-10-26 NOTE — Telephone Encounter (Signed)
 Copied from CRM [id number]. Topic: Clinical - Red Word Triage >> Oct 26, 2023  2:03 PM Brooke [name] wrote: Red Word that prompted transfer to Nurse Triage: Patient reports noticing black stool this morning. She stated she experienced a stomach ache last night but currently denies abdominal pain or other symptoms. Patient is requesting medication to be prescribed.  Chief Complaint: unusual stool color Symptoms: black stool color x1  Frequency: today Pertinent Negatives: Patient denies fever, n/v Disposition: [] ED /[] Urgent Care (no appt availability in office) / [x] Appointment(In office/virtual)/ []  [facility]/ [] Home Care/ [] Refused Recommended Disposition /[] [facility]/ []  Follow-up with PCP Additional Notes: pt stated bowel movement was black and is concerned: scheduled in office appt to be seen for evaluation and tx.  Answer Assessment - Initial Assessment Questions 1. COLOR: "What color is it?" "Is that color in part or all of the stool?"     Black formed 2. ONSET: "When was the unusual color first noted?"     today 3. CAUSE: "Have you eaten any food or taken any medicine of this color?" Note: See listing in Background Information section.      N/a 4. OTHER SYMPTOMS: "Do you have any other symptoms?" (e.g., abdomen pain, diarrhea, jaundice, fever).     Abd pain  Protocols used: Stools - Unusual Color-A-AH

## 2023-10-26 NOTE — Telephone Encounter (Signed)
 Copied from CRM [id number]. Topic: Clinical - Red Word Triage >> Oct 26, 2023  2:03 PM Brooke [name] wrote: Red Word that prompted transfer to Nurse Triage: Patient reports noticing black stool this morning. She stated she experienced a stomach ache last night but currently denies abdominal pain or other symptoms. Patient is requesting medication to be prescribed.

## 2023-10-26 NOTE — Telephone Encounter (Signed)
 Copied from CRM [id number]. Topic: Clinical - Red Word Triage >> Oct 26, 2023  2:03 PM [name] H wrote: Red Word that prompted transfer to Nurse Triage: Patient reports noticing black stool this morning. She stated she experienced a stomach ache last night but currently denies abdominal pain or other symptoms. Patient is requesting medication to be prescribed.  Patient was not on line when agent transferred call - will attempt to reach back out to patient.

## 2023-10-27 ENCOUNTER — Ambulatory Visit: Payer: Self-pay | Admitting: Internal Medicine

## 2023-10-27 ENCOUNTER — Ambulatory Visit (INDEPENDENT_AMBULATORY_CARE_PROVIDER_SITE_OTHER): Admitting: Internal Medicine

## 2023-10-27 ENCOUNTER — Encounter: Payer: Self-pay | Admitting: Internal Medicine

## 2023-10-27 VITALS — BP 114/80 | HR 58 | Temp 98.0°F | Wt 149.0 lb

## 2023-10-27 DIAGNOSIS — R195 Other fecal abnormalities: Secondary | ICD-10-CM | POA: Diagnosis not present

## 2023-10-27 DIAGNOSIS — K921 Melena: Secondary | ICD-10-CM | POA: Diagnosis not present

## 2023-10-27 DIAGNOSIS — G309 Alzheimer's disease, unspecified: Secondary | ICD-10-CM

## 2023-10-27 DIAGNOSIS — G3184 Mild cognitive impairment, so stated: Secondary | ICD-10-CM | POA: Diagnosis not present

## 2023-10-27 LAB — CBC
HCT: 39.3 % (ref 36.0–46.0)
Hemoglobin: 12.4 g/dL (ref 12.0–15.0)
MCHC: 31.5 g/dL (ref 30.0–36.0)
MCV: 74.7 fl — ABNORMAL LOW (ref 78.0–100.0)
Platelets: 184 10*3/uL (ref 150.0–400.0)
RBC: 5.26 Mil/uL — ABNORMAL HIGH (ref 3.87–5.11)
RDW: 16 % — ABNORMAL HIGH (ref 11.5–15.5)
WBC: 5.8 10*3/uL (ref 4.0–10.5)

## 2023-10-27 LAB — FERRITIN: Ferritin: 111.7 ng/mL (ref 10.0–291.0)

## 2023-10-27 NOTE — Addendum Note (Signed)
 Addended by: Arvell Birchwood on: 10/27/2023 10:12 AM   Modules accepted: Orders

## 2023-10-27 NOTE — Assessment & Plan Note (Signed)
 Last colonoscopy 2021 without significant burden of diverticuli or documented hemorrhoids. She is not a clear historian but describes a few episodes of dark stool in last few days none in last day. No abdominal pain. No blood in stool or with wiping. Checking CBC and ferritin and stool cards times 3.

## 2023-10-27 NOTE — Patient Instructions (Signed)
 We will check the labs today for the iron and give you stool cards to use and bring back.

## 2023-10-27 NOTE — Assessment & Plan Note (Signed)
 Son is unclear about what is going on with this. I will reach out to PCP to communicate with them.

## 2023-10-27 NOTE — Progress Notes (Signed)
   Subjective:   Patient ID: Brooke [name], female    DOB: [birth year], 73 y.o.   MRN: [id number]  HPI The patient is a 73 YO female coming in for dark stool. This happened a few days ago. No iron pills or change in medications. No stomach pain. Denies constipation or diarrhea. Not taking any otc. Also having short term memory change and talked to PCP about this and wants to know what is going on with this.  Review of Systems  Constitutional: Negative.   HENT: Negative.    Eyes: Negative.   Respiratory:  Negative for cough, chest tightness and shortness of breath.   Cardiovascular:  Negative for chest pain, palpitations and leg swelling.  Gastrointestinal:  Positive for blood in stool. Negative for abdominal distention, abdominal pain, constipation, diarrhea, nausea and vomiting.  Musculoskeletal: Negative.   Skin: Negative.   Neurological: Negative.   Psychiatric/Behavioral: Negative.      Objective:  Physical Exam Constitutional:      Appearance: She is well-developed.  HENT:     Head: Normocephalic and atraumatic.  Cardiovascular:     Rate and Rhythm: Normal rate and regular rhythm.  Pulmonary:     Effort: Pulmonary effort is normal. No respiratory distress.     Breath sounds: Normal breath sounds. No wheezing or rales.  Abdominal:     General: Bowel sounds are normal. There is no distension.     Palpations: Abdomen is soft.     Tenderness: There is no abdominal tenderness. There is no rebound.  Musculoskeletal:     Cervical back: Normal range of motion.  Skin:    General: Skin is warm and dry.  Neurological:     Mental Status: She is alert and oriented to person, place, and time.     Coordination: Coordination normal.     Vitals:   10/27/23 0933  BP: 114/80  Pulse: (!) 58  Temp: 98 F (36.7 C)  TempSrc: Oral  SpO2: 98%  Weight: 149 lb (67.6 kg)    Assessment & Plan:

## 2023-10-27 NOTE — Telephone Encounter (Signed)
 Copied from CRM [id number]. Topic: Clinical - Red Word Triage >> Oct 26, 2023  2:03 PM [name] H wrote: Red Word that prompted transfer to Nurse Triage: Patient reports noticing black stool this morning. She stated she experienced a stomach ache last night but currently denies abdominal pain or other symptoms. Patient is requesting medication to be prescribed. This encounter was created in error - please disregard.

## 2023-11-04 ENCOUNTER — Encounter

## 2023-11-05 ENCOUNTER — Encounter: Payer: Self-pay | Admitting: Physician Assistant

## 2023-11-05 ENCOUNTER — Ambulatory Visit: Payer: Medicare Other | Admitting: Physician Assistant

## 2023-11-05 VITALS — BP 131/70 | HR 76 | Resp 20 | Wt 155.0 lb

## 2023-11-05 DIAGNOSIS — G3184 Mild cognitive impairment, so stated: Secondary | ICD-10-CM

## 2023-11-05 DIAGNOSIS — G309 Alzheimer's disease, unspecified: Secondary | ICD-10-CM

## 2023-11-05 MED ORDER — MEMANTINE HCL 10 MG PO TABS: 10.0000 mg | ORAL_TABLET | Freq: Every evening | ORAL | 11 refills | Status: DC

## 2023-11-05 NOTE — Progress Notes (Signed)
 Assessment/Plan:    Mild cognitive impairment due to Alzheimer's disease  Brooke [name] is a very pleasant 73 y.o. RH female with a history of hypertension, hyperlipidemia, anxiety, situational depression, arthritis, CAD, prediabetes, GERD, microcytic anemia, insomnia and a diagnosis of MCI due to Alzheimer's disease per neuropsychological evaluation  presenting today in follow-up for evaluation of memory loss.  Patient has not been compliant with memantine  since at least Nov 2024. Family has been more involved in medicine management. She is participating in more social  and physical activities than before. She continues to drive.        Recommendations:   Follow up in  6 months. Will consider repeating the neuropsych evaluation not earlier than a year from the prior one in November 2024 for diagnostic clarity and disease trajectory. Resume memantine  to 10 mg  daily, side effects discussed.   Recommend good control of cardiovascular risk factors Monitor driving Continue to control mood as per PCP Continue social and physical activities, consider joining a [facility]    Subjective:   This patient is accompanied in the office by her granddaughter who supplements the history. Previous records as well as any outside records available were reviewed prior to todays visit.   Patient was last seen on 02/11/2023 with [facility] 15/30.    Any changes in memory since last visit? "Maybe worse at times". STM is worse than LTM. She has to write the grocery list she does not forget the items.  She likes going to [place], loves doing crossword puzzles. She is [facility] [employer] and socializing  more. Repeats oneself?  Endorsed Disoriented when walking into a room?  Patient denies.    Misplacing objects?  She may lose the keys but not in unusual places.  Wandering behavior?   Denies. Any personality changes since last visit?  There are episodes of "fixation with the bank, she was spending SS money, until  family took over the bank account"." Because of that she is more" irritable.    Any worsening depression?:  He has a history of depression and anxiety. Hallucinations or paranoia?  Denies.   Seizures?   Denies.    Any sleep changes? Sleeps well, goes to sleep very early. Denies vivid dreams, REM behavior or sleepwalking   Sleep apnea?   denies    Any hygiene concerns?   Denies.   Independent of bathing and dressing?  Endorsed  Does the patient needs help with medications? Patient is in charge   Who is in charge of the finances?  Patient is in charge     Any changes in appetite? "She could eat better"-granddaughter says.    Patient have trouble swallowing?  Denies.   Does the patient cook?  Any kitchen accidents such as leaving the stove on?   Denies.   Any headaches?    Denies.   Vision changes? Denies. Chronic pain?  Denies.   Ambulates with difficulty?    Denies.    Recent falls or head injuries?    Denies.      Unilateral weakness, numbness or tingling?  Denies.   Any tremors?  Denies.   Any anosmia?    Denies.   Any incontinence of urine?  Denies.   Any bowel dysfunction?  Denies.      Patient lives alone  Does the patient drive?  Endorsed, "One time she drove and got lost "   Initial visit 02/11/2023 How long did patient have memory difficulties?  She denies any  difficulties at this time.  In the past, about 1 year ago he reported having some forgetfulness, but now she feels that she is at her normal.  Her children believe that her memory is "not good". She writes her grocery list not to forget the items.  She forgets her son's schedules "If I come from my second shift she asks why am I at home, I should be at work". She likes going to [place]. She loves crossword puzzles.  repeats oneself?  Endorsed Disoriented when walking into a room?  Patient denies   Leaving objects in unusual places?  Her sons are concerned that she loses things more frequently especially the keys.  Wandering  behavior? Denies.   Any personality changes ? Denies.   Any history of depression?:  She has a history of depression and anxiety  Hallucinations or paranoia? Denies.   Seizures? denies    Any sleep changes?  Sleeps well. Denies vivid dreams, REM behavior or sleepwalking   Sleep apnea? Denies.   Any hygiene concerns?  Denies.   Independent of bathing and dressing? Endorsed  Does the patient need help with medications?  Patient is in charge   Who is in charge of the finances? Patient is in charge     Any changes in appetite?  Appetite is decreased "I am the only one there to eat"    Patient have trouble swallowing?  denies   Does the patient cook? No.  Any headaches?  denies   Chronic pain? denies   Ambulates with difficulty? Denies.   Recent falls or head injuries? denies     Vision changes? Unilateral weakness, numbness or tingling? denies   Any tremors? Denies.   Any anosmia? Denies.   Any incontinence of urine? Denies.   Any bowel dysfunction? Denies.      Patient lives alone.  History of heavy alcohol intake? Denies.   History of heavy tobacco use? Denies.   Family history of dementia? Patient denies  Does patient drive? Yes, she denies any issues, but son reports that coming here she got lost. She was going to "rehearse" before coming here.      MRI of the brain personally reviewed, remarkable for bifrontal superficial siderosis, greater on the right. No specificity to the pattern and mild for age cerebral volume loss    Neuropsych evaluation 04/13/2023 briefly, results suggested severe impairment surrounding essentially all aspects of learning and memory. Additional impairments were exhibited across executive functioning, receptive language, semantic fluency, and confrontation naming. Performance variability was exhibited across processing speed and attention/concentration. Regarding the cause for ongoing cognitive impairment, my primary concerns surround Alzheimer's disease.  Across memory testing, Ms. Koenigsberg did not benefit from repeated exposure to novel information, was fully amnestic (i.e., 0% retention) across all memory tasks after a brief delay, and generally performed poorly across yes/no recognition trials. Taken together, this suggests evidence for rapid forgetting and an evolving and already fairly significant storage impairment, both of which are the hallmark testing patterns of Alzheimer's disease. Additional impairments surrounding semantic fluency, confrontation naming, and executive functioning would follow typical and expected disease progression. This would appear the most likely explanation for her pattern of deficits at the present time.   Past Medical History:  Diagnosis Date   Allergic rhinitis 02/22/2007   Eczema 03/25/2021   Essential hypertension 02/18/2007   GERD (gastroesophageal reflux disease) 06/01/2019   Hyperlipidemia 07/22/2007   Insomnia 04/15/2016   Trazodone  not effective  Mirtazapine   15 mg not effective  Lipoma of chest wall 07/23/2022   Major depressive disorder 09/04/2020   Microcytic anemia 10/12/2013   Electrophoresis normal     Mild cognitive impairment due to Alzheimer's disease 04/13/2023   Obesity    Osteoarthritis 03/30/2008   Osteopenia 08/25/2022   08/25/2022 spine -1.5, RFN -1.4, FRAX is low     Prediabetes 05/07/2015     Past Surgical History:  Procedure Laterality Date   CARPAL TUNNEL RELEASE     bilateral   HIP SURGERY       PREVIOUS MEDICATIONS:   CURRENT MEDICATIONS:  Outpatient Encounter Medications as of 11/05/2023  Medication Sig   amLODipine  (NORVASC ) 10 MG tablet Take 1 tablet by mouth once daily   aspirin  81 MG chewable tablet Chew 81 mg by mouth daily.   atenolol  (TENORMIN ) 25 MG tablet Take 1 tablet by mouth once daily   atorvastatin  (LIPITOR) 20 MG tablet Take 1 tablet by mouth once daily   famotidine  (PEPCID ) 40 MG tablet Take 1 tablet (40 mg total) by mouth daily as needed for  heartburn or indigestion.   omeprazole  (PRILOSEC) 20 MG capsule Take 1 capsule (20 mg total) by mouth daily.   potassium chloride  (KLOR-CON ) 10 MEQ tablet TAKE 1 TABLET BY MOUTH THREE TIMES DAILY   [DISCONTINUED] memantine  (NAMENDA ) 10 MG tablet Take 1 tablet (10 mg total) by mouth at bedtime.   memantine  (NAMENDA ) 10 MG tablet Take 1 tablet (10 mg total) by mouth at bedtime.   [DISCONTINUED] potassium chloride  (KLOR-CON  M10) 10 MEQ tablet Take 1 tablet (10 mEq total) by mouth three times daily.   No facility-administered encounter medications on file as of 11/05/2023.     Objective:     PHYSICAL EXAMINATION:    VITALS:   Vitals:   11/05/23 1111  BP: 131/70  Pulse: 76  Resp: 20  SpO2: 99%  Weight: 155 lb (70.3 kg)    GEN:  The patient appears stated age and is in NAD. HEENT:  Normocephalic, atraumatic.   Neurological examination:  General: NAD, well-groomed, appears stated age. Orientation: The patient is alert. Oriented to person, place and not to date.  Cranial nerves: There is good facial symmetry.The speech is fluent and clear. No aphasia or dysarthria. Fund of knowledge is appropriate. Recent memory impaired and remote memory is normal.  Attention and concentration are normal.  Able to name objects and repeat phrases.  Hearing is intact to conversational tone   Sensation: Sensation is intact to light touch throughout Motor: Strength is at least antigravity x4. DTR's 2/4 in UE/LE      02/11/2023   11:00 AM  Montreal Cognitive Assessment   Visuospatial/ Executive (0/5) 3  Naming (0/3) 3  Attention: Read list of digits (0/2) 2  Attention: Read list of letters (0/1) 1  Attention: Serial 7 subtraction starting at 100 (0/3) 1  Language: Repeat phrase (0/2) 1  Language : Fluency (0/1) 0  Abstraction (0/2) 0  Delayed Recall (0/5) 0  Orientation (0/6) 3  Total 14  Adjusted Score (based on education) 15       08/21/2016    3:01 PM  MMSE - Mini Mental State Exam  Not  completed: Refused       Movement examination: Tone: There is normal tone in the UE/LE Abnormal movements:  no tremor.  No myoclonus.  No asterixis.   Coordination:  There is no decremation with RAM's. Normal finger to nose  Gait and Station: The patient has no difficulty arising out of  a deep-seated chair without the use of the hands. The patient's stride length is good.  Gait is cautious and narrow.   Thank you for allowing us  the opportunity to participate in the care of this nice patient. Please do not hesitate to contact us  for any questions or concerns.   Total time spent on today's visit was 20 minutes dedicated to this patient today, preparing to see patient, examining the patient, ordering tests and/or medications and counseling the patient, documenting clinical information in the EHR or other health record, independently interpreting results and communicating results to the patient/family, discussing treatment and goals, answering patient's questions and coordinating care.  Cc:  Colene Dauphin, MD  Tex Filbert 11/05/2023 12:36 PM

## 2023-11-05 NOTE — Patient Instructions (Signed)
 It was a pleasure to see you today at our office.   Recommendations:   Follow up in 6 months   Continue Memantine  10 mg nightly   Consider [facility]  [address], [place] [address] [phone]  Hours of Operation Mondays to Thursdays: 8 am to 8 pm,Fridays: 9 am to 8 pm, Saturdays: 9 am to 1 pm Sundays: Closed  [email or web address]    For psychiatric meds, mood meds: Please have your primary care physician manage these medications.  If you have any severe symptoms of a stroke, or other severe issues such as confusion,severe chills or fever, etc call 911 or go to the ER as you may need to be evaluated further  For guidance regarding [facility] and if placement were needed at the facility, contact Social Worker tel: [phone]  For assessment of decision of mental capacity and competency:  Call Dr. Laverne Potter, geriatric psychiatrist at [phone]  Counseling regarding caregiver distress, including caregiver depression, anxiety and issues regarding community resources, adult day care programs, adult living facilities, or memory care questions:  please contact your  Primary Doctor's Social Worker   Whom to call: Memory  decline, memory medications: Call our office [phone]    [email or web address]   RECOMMENDATIONS FOR ALL PATIENTS WITH MEMORY PROBLEMS: 1. Continue to exercise (Recommend 30 minutes of walking everyday, or 3 hours every week) 2. Increase social interactions - continue going to [place] and enjoy social gatherings with friends and family 3. Eat healthy, avoid fried foods and eat more fruits and vegetables 4. Maintain adequate blood pressure, blood sugar, and blood cholesterol level. Reducing the risk of stroke and cardiovascular disease also helps promoting better memory. 5. Avoid  stressful situations. Live a simple life and avoid aggravations. Organize your time and prepare for the next day in anticipation. 6. Sleep well, avoid any interruptions of sleep and avoid any distractions in the bedroom that may interfere with adequate sleep quality 7. Avoid sugar, avoid sweets as there is a strong link between excessive sugar intake, diabetes, and cognitive impairment We discussed the Mediterranean diet, which has been shown to help patients reduce the risk of progressive memory disorders and reduces cardiovascular risk. This includes eating fish, eat fruits and green leafy vegetables, nuts like almonds and hazelnuts, walnuts, and also use olive oil. Avoid fast foods and fried foods as much as possible. Avoid sweets and sugar as sugar use has been linked to worsening of memory function.  There is always a concern of gradual progression of memory problems. If this is the case, then we may need to adjust level of care according to patient needs. Support, both to the patient and caregiver, should then be put into place.      You have been referred for a neuropsychological evaluation (i.e., evaluation of memory and thinking abilities). Please bring someone with you to this appointment if possible, as it is helpful for the doctor to hear from both you and another adult who knows you well. Please bring eyeglasses and hearing aids if you wear them.    The evaluation will take approximately 3 hours and has two parts:   The first part is a clinical interview with the neuropsychologist (Dr. Kitty Perkins or Dr. Annette Barters). During the interview, the neuropsychologist will speak with you and the individual you brought to the appointment.    The second part of the evaluation is testing with the doctor's technician Bernabe Brew or Burdette Carolin). During the testing, the technician will  ask you to remember different types of material, solve problems, and answer some questionnaires. Your family member will not be present for  this portion of the evaluation.   Please note: We must reserve several hours of the neuropsychologist's time and the psychometrician's time for your evaluation appointment. As such, there is a No-Show fee of $100. If you are unable to attend any of your appointments, please contact our office as soon as possible to reschedule.      DRIVING: Regarding driving, in patients with progressive memory problems, driving will be impaired. We advise to have someone else do the driving if trouble finding directions or if minor accidents are reported. Independent driving assessment is available to determine safety of driving.   If you are interested in the driving assessment, you can contact the following:  [facility] [employer] [facility] in [place] [phone]  [facility] [phone]  [facility] [phone]  [facility] [phone] or [phone]   FALL PRECAUTIONS: Be cautious when walking. Scan the area for obstacles that may increase the risk of trips and falls. When getting up in the mornings, sit up at the edge of the bed for a few minutes before getting out of bed. Consider elevating the bed at the head end to avoid drop of blood pressure when getting up. Walk always in a well-lit room (use night lights in the walls). Avoid area rugs or power cords from appliances in the middle of the walkways. Use a walker or a cane if necessary and consider physical therapy for balance exercise. Get your eyesight checked regularly.  FINANCIAL OVERSIGHT: Supervision, especially oversight when making financial decisions or transactions is also recommended.  HOME SAFETY: Consider the safety of the kitchen when operating appliances like stoves, microwave oven, and blender. Consider having supervision and share cooking responsibilities until no longer able to participate in those. Accidents with firearms and other hazards in the house should be identified and addressed as  well.   ABILITY TO BE LEFT ALONE: If patient is unable to contact 911 operator, consider using LifeLine, or when the need is there, arrange for someone to stay with patients. Smoking is a fire hazard, consider supervision or cessation. Risk of wandering should be assessed by caregiver and if detected at any point, supervision and safe proof recommendations should be instituted.  MEDICATION SUPERVISION: Inability to self-administer medication needs to be constantly addressed. Implement a mechanism to ensure safe administration of the medications.      Mediterranean Diet A Mediterranean diet refers to food and lifestyle choices that are based on the traditions of countries located on the [employer]. This way of eating has been shown to help prevent certain conditions and improve outcomes for people who have chronic diseases, like kidney disease and heart disease. What are tips for following this plan? Lifestyle  Cook and eat meals together with your family, when possible. Drink enough fluid to keep your urine clear or pale yellow. Be physically active every day. This includes: Aerobic exercise like running or swimming. Leisure activities like gardening, walking, or housework. Get 7-8 hours of sleep each night. If recommended by your health care provider, drink red wine in moderation. This means 1 glass a day for nonpregnant women and 2 glasses a day for men. A glass of wine equals 5 oz (150 mL). Reading food labels  Check the serving size of packaged foods. For foods such as rice and pasta, the serving size refers to the amount of cooked product, not  dry. Check the total fat in packaged foods. Avoid foods that have saturated fat or trans fats. Check the ingredients list for added sugars, such as corn syrup. Shopping  At the grocery store, buy most of your food from the areas near the walls of the store. This includes: Fresh fruits and vegetables (produce). Grains, beans, nuts, and  seeds. Some of these may be available in unpackaged forms or large amounts (in bulk). Fresh seafood. Poultry and eggs. Low-fat dairy products. Buy whole ingredients instead of prepackaged foods. Buy fresh fruits and vegetables in-season from local farmers markets. Buy frozen fruits and vegetables in resealable bags. If you do not have access to quality fresh seafood, buy precooked frozen shrimp or canned fish, such as tuna, salmon, or sardines. Buy small amounts of raw or cooked vegetables, salads, or olives from the deli or salad bar at your store. Stock your pantry so you always have certain foods on hand, such as olive oil, canned tuna, canned tomatoes, rice, pasta, and beans. Cooking  Cook foods with extra-virgin olive oil instead of using butter or other vegetable oils. Have meat as a side dish, and have vegetables or grains as your main dish. This means having meat in small portions or adding small amounts of meat to foods like pasta or stew. Use beans or vegetables instead of meat in common dishes like chili or lasagna. Experiment with different cooking methods. Try roasting or broiling vegetables instead of steaming or sauteing them. Add frozen vegetables to soups, stews, pasta, or rice. Add nuts or seeds for added healthy fat at each meal. You can add these to yogurt, salads, or vegetable dishes. Marinate fish or vegetables using olive oil, lemon juice, garlic, and fresh herbs. Meal planning  Plan to eat 1 vegetarian meal one day each week. Try to work up to 2 vegetarian meals, if possible. Eat seafood 2 or more times a week. Have healthy snacks readily available, such as: Vegetable sticks with hummus. Greek yogurt. Fruit and nut trail mix. Eat balanced meals throughout the week. This includes: Fruit: 2-3 servings a day Vegetables: 4-5 servings a day Low-fat dairy: 2 servings a day Fish, poultry, or lean meat: 1 serving a day Beans and legumes: 2 or more servings a week Nuts  and seeds: 1-2 servings a day Whole grains: 6-8 servings a day Extra-virgin olive oil: 3-4 servings a day Limit red meat and sweets to only a few servings a month What are my food choices? Mediterranean diet Recommended Grains: Whole-grain pasta. Brown rice. Bulgar wheat. Polenta. Couscous. Whole-wheat bread. Dwyane Glad. Vegetables: Artichokes. Beets. Broccoli. Cabbage. Carrots. Eggplant. Green beans. Chard. Kale. Spinach. Onions. Leeks. Peas. Squash. Tomatoes. Peppers. Radishes. Fruits: Apples. Apricots. Avocado. Berries. Bananas. Cherries. Dates. Figs. Grapes. Lemons. Melon. Oranges. Peaches. Plums. Pomegranate. Meats and other protein foods: Beans. Almonds. Sunflower seeds. Pine nuts. Peanuts. Cod. Salmon. Scallops. Shrimp. Tuna. Tilapia. Clams. Oysters. Eggs. Dairy: Low-fat milk. Cheese. Greek yogurt. Beverages: Water. Red wine. Herbal tea. Fats and oils: Extra virgin olive oil. Avocado oil. Grape seed oil. Sweets and desserts: [place] yogurt with honey. Baked apples. Poached pears. Trail mix. Seasoning and other foods: Basil. Cilantro. Coriander. Cumin. Mint. Parsley. Sage. Rosemary. Tarragon. Garlic. Oregano. Thyme. Pepper. Balsalmic vinegar. Tahini. Hummus. Tomato sauce. Olives. Mushrooms. Limit these Grains: Prepackaged pasta or rice dishes. Prepackaged cereal with added sugar. Vegetables: Deep fried potatoes (french fries). Fruits: Fruit canned in syrup. Meats and other protein foods: Beef. Pork. Lamb. Poultry with skin. Hot dogs. Helene Loader. Dairy: [employer]  cream. Sour cream. Whole milk. Beverages: Juice. Sugar-sweetened soft drinks. Beer. Liquor and spirits. Fats and oils: Butter. Canola oil. Vegetable oil. Beef fat (tallow). Lard. Sweets and desserts: Cookies. Cakes. Pies. Candy. Seasoning and other foods: Mayonnaise. Premade sauces and marinades. The items listed may not be a complete list. Talk with your dietitian about what dietary choices are right for you. Summary The  Mediterranean diet includes both food and lifestyle choices. Eat a variety of fresh fruits and vegetables, beans, nuts, seeds, and whole grains. Limit the amount of red meat and sweets that you eat. Talk with your health care provider about whether it is safe for you to drink red wine in moderation. This means 1 glass a day for nonpregnant women and 2 glasses a day for men. A glass of wine equals 5 oz (150 mL). This information is not intended to replace advice given to you by your health care provider. Make sure you discuss any questions you have with your health care provider. Document Released: 01/17/2016 Document Revised: 02/19/2016 Document Reviewed: 01/17/2016 Elsevier Interactive Patient Education  2017 [employer].

## 2023-11-08 ENCOUNTER — Other Ambulatory Visit: Payer: Self-pay | Admitting: Internal Medicine

## 2023-11-11 ENCOUNTER — Other Ambulatory Visit: Payer: Self-pay | Admitting: Internal Medicine

## 2023-11-11 ENCOUNTER — Ambulatory Visit
Admission: RE | Admit: 2023-11-11 | Discharge: 2023-11-11 | Disposition: A | Discharge status: Home / Self Care | Source: Ambulatory Visit | Attending: Internal Medicine | Admitting: Internal Medicine

## 2023-11-11 DIAGNOSIS — R921 Mammographic calcification found on diagnostic imaging of breast: Secondary | ICD-10-CM

## 2023-11-12 NOTE — Progress Notes (Signed)
 I have mailed results to the patient

## 2023-12-14 ENCOUNTER — Other Ambulatory Visit: Payer: Self-pay | Admitting: Internal Medicine

## 2023-12-15 ENCOUNTER — Telehealth: Payer: Self-pay

## 2023-12-15 NOTE — Telephone Encounter (Signed)
 Spoke with patient today and med list printed and placed in outgoing mail.

## 2023-12-15 NOTE — Telephone Encounter (Signed)
 Copied from CRM [id number]. Topic: Clinical - Medication Question >> Dec 15, 2023 12:34 PM Drema MATSU wrote: Reason for CRM: Patient misplaced the list of medications that she is suppose to start taking and wants to know if she can get a copy mailed to her. Patient is requesting a callback from the nurse.

## 2024-01-07 ENCOUNTER — Other Ambulatory Visit: Payer: Self-pay | Admitting: Internal Medicine

## 2024-02-21 ENCOUNTER — Encounter: Payer: Self-pay | Admitting: Internal Medicine

## 2024-02-21 NOTE — Progress Notes (Signed)
 Subjective:    Patient ID: Brooke [name], female    DOB: Oct 12, 1950, 73 y.o.   MRN: [id number]      HPI Brooke [name] is here for a Physical exam and her chronic medical problems.   Has not been taking her medications regularly.  Her son just relates that about a week ago.  He and her daughter are now going over to make sure that she is taking her medication on a regular basis.  She does feel that her short-term memory has worsened.  He is wondering what we can do about that.   Medications and allergies reviewed with patient and updated if appropriate.  Current Outpatient Medications on File Prior to Visit  Medication Sig Dispense Refill   amLODipine  (NORVASC ) 10 MG tablet Take 1 tablet by mouth once daily 90 tablet 0   aspirin  81 MG chewable tablet Chew 81 mg by mouth daily.     atenolol  (TENORMIN ) 25 MG tablet Take 1 tablet by mouth once daily 90 tablet 0   atorvastatin  (LIPITOR) 20 MG tablet Take 1 tablet by mouth once daily 90 tablet 0   famotidine  (PEPCID ) 40 MG tablet Take 1 tablet (40 mg total) by mouth daily as needed for heartburn or indigestion. 90 tablet 3   memantine  (NAMENDA ) 10 MG tablet Take 1 tablet (10 mg total) by mouth at bedtime. 30 tablet 11   omeprazole  (PRILOSEC) 20 MG capsule Take 1 capsule (20 mg total) by mouth daily. 90 capsule 3   potassium chloride  (KLOR-CON ) 10 MEQ tablet TAKE 1 TABLET BY MOUTH THREE TIMES DAILY 270 tablet 0   [DISCONTINUED] potassium chloride  (KLOR-CON  M10) 10 MEQ tablet Take 1 tablet (10 mEq total) by mouth three times daily. 90 tablet 0   No current facility-administered medications on file prior to visit.    Review of Systems  Constitutional:  Negative for fever.  HENT:  Negative for trouble swallowing.   Eyes:  Negative for visual disturbance.  Respiratory:  Negative for cough, shortness of breath and wheezing.   Cardiovascular:  Negative for chest pain, palpitations and leg swelling.  Gastrointestinal:  Negative for  abdominal pain, blood in stool, constipation and diarrhea.       No gerd  Genitourinary:  Negative for dysuria.  Musculoskeletal:  Negative for arthralgias and back pain.  Skin:  Negative for rash.  Neurological:  Negative for light-headedness and headaches.  Psychiatric/Behavioral:  Negative for dysphoric mood and sleep disturbance. The patient is not nervous/anxious.        Objective:   Vitals:   02/22/24 0812  BP: 126/80  Pulse: 81  Temp: 98.3 F (36.8 C)  SpO2: 99%   Filed Weights   02/22/24 0812  Weight: 149 lb (67.6 kg)   Body mass index is 28.15 kg/m.  BP Readings from Last 3 Encounters:  02/22/24 126/80  11/05/23 131/70  10/27/23 114/80    Wt Readings from Last 3 Encounters:  02/22/24 149 lb (67.6 kg)  11/05/23 155 lb (70.3 kg)  10/27/23 149 lb (67.6 kg)       Physical Exam Constitutional: She appears well-developed and well-nourished. No distress.  HENT:  Head: Normocephalic and atraumatic.  Right Ear: External ear normal. Normal ear canal and TM Left Ear: External ear normal.  Normal ear canal and TM Mouth/Throat: Oropharynx is clear and moist.  Eyes: Conjunctivae normal.  Neck: Neck supple. No tracheal deviation present. No thyromegaly present.  No carotid bruit  Cardiovascular: Normal rate, regular rhythm and  normal heart sounds.   No murmur heard.  No edema. Pulmonary/Chest: Effort normal and breath sounds normal. No respiratory distress. She has no wheezes. She has no rales.  Breast: deferred   Abdominal: Soft. She exhibits no distension. There is no tenderness.  Lymphadenopathy: She has no cervical adenopathy.  Skin: Skin is warm and dry. She is not diaphoretic.  Psychiatric: She has a normal mood and affect. Her behavior is normal.     Lab Results  Component Value Date   WBC 5.8 10/27/2023   HGB 12.4 10/27/2023   HCT 39.3 10/27/2023   PLT 184.0 10/27/2023   GLUCOSE 118 (H) 08/18/2023   CHOL 149 08/18/2023   TRIG 72.0 08/18/2023    HDL 68.80 08/18/2023   LDLDIRECT 162.0 08/05/2012   LDLCALC 66 08/18/2023   ALT 30 08/18/2023   AST 24 08/18/2023   NA 141 08/18/2023   K 4.1 08/18/2023   CL 105 08/18/2023   CREATININE 1.01 08/18/2023   BUN 18 08/18/2023   CO2 29 08/18/2023   TSH 1.41 02/11/2023   INR 1.9 (H) 04/15/2008   HGBA1C 5.8 08/18/2023   MICROALBUR 24.2 (H) 03/19/2009         Assessment & Plan:   Physical exam: Screening blood work  ordered Exercise - going to gym  3/week Weight  is good Substance abuse  none   Reviewed recommended immunizations.  Flu immunization administered today.     Health Maintenance  Topic Date Due   Zoster Vaccines- Shingrix (1 of 2) 04/17/1999   Medicare Annual Wellness (AWV)  08/21/2017   DTaP/Tdap/Td (3 - Tdap) 03/30/2018   Influenza Vaccine  01/08/2024   COVID-19 Vaccine (4 - 2025-26 season) 02/08/2024   DEXA SCAN  08/24/2025   Mammogram  11/10/2025   Colonoscopy  10/09/2029   Pneumococcal Vaccine: 50+ Years  Completed   Hepatitis C Screening  Completed   HPV VACCINES  Aged Out   Meningococcal B Vaccine  Aged Out          See Problem List for Assessment and Plan of chronic medical problems.

## 2024-02-21 NOTE — Patient Instructions (Addendum)
 Flu immunization administered today.      Blood work was ordered.       Medications changes include :   Try stopping the omeprazole  and pepcid .  Change potassium to 20 meg once  a day     Return in about 6 months (around 08/21/2024) for follow up.    Health Maintenance, Female Adopting a healthy lifestyle and getting preventive care are important in promoting health and wellness. Ask your health care provider about: The right schedule for you to have regular tests and exams. Things you can do on your own to prevent diseases and keep yourself healthy. What should I know about diet, weight, and exercise? Eat a healthy diet  Eat a diet that includes plenty of vegetables, fruits, low-fat dairy products, and lean protein. Do not eat a lot of foods that are high in solid fats, added sugars, or sodium. Maintain a healthy weight Body mass index (BMI) is used to identify weight problems. It estimates body fat based on height and weight. Your health care provider can help determine your BMI and help you achieve or maintain a healthy weight. Get regular exercise Get regular exercise. This is one of the most important things you can do for your health. Most adults should: Exercise for at least 150 minutes each week. The exercise should increase your heart rate and make you sweat (moderate-intensity exercise). Do strengthening exercises at least twice a week. This is in addition to the moderate-intensity exercise. Spend less time sitting. Even light physical activity can be beneficial. Watch cholesterol and blood lipids Have your blood tested for lipids and cholesterol at 75 years of age, then have this test every 5 years. Have your cholesterol levels checked more often if: Your lipid or cholesterol levels are high. You are older than 75 years of age. You are at high risk for heart disease. What should I know about cancer screening? Depending on your health history and family history,  you may need to have cancer screening at various ages. This may include screening for: Breast cancer. Cervical cancer. Colorectal cancer. Skin cancer. Lung cancer. What should I know about heart disease, diabetes, and high blood pressure? Blood pressure and heart disease High blood pressure causes heart disease and increases the risk of stroke. This is more likely to develop in people who have high blood pressure readings or are overweight. Have your blood pressure checked: Every 3-5 years if you are 64-85 years of age. Every year if you are 40 years old or older. Diabetes Have regular diabetes screenings. This checks your fasting blood sugar level. Have the screening done: Once every three years after age 84 if you are at a normal weight and have a low risk for diabetes. More often and at a younger age if you are overweight or have a high risk for diabetes. What should I know about preventing infection? Hepatitis B If you have a higher risk for hepatitis B, you should be screened for this virus. Talk with your health care provider to find out if you are at risk for hepatitis B infection. Hepatitis C Testing is recommended for: Everyone born from [birth year] through 1967. Anyone with known risk factors for hepatitis C. Sexually transmitted infections (STIs) Get screened for STIs, including gonorrhea and chlamydia, if: You are sexually active and are younger than 75 years of age. You are older than 75 years of age and your health care provider tells you that you are at risk for this  type of infection. Your sexual activity has changed since you were last screened, and you are at increased risk for chlamydia or gonorrhea. Ask your health care provider if you are at risk. Ask your health care provider about whether you are at high risk for HIV. Your health care provider may recommend a prescription medicine to help prevent HIV infection. If you choose to take medicine to prevent HIV, you should  first get tested for HIV. You should then be tested every 3 months for as long as you are taking the medicine. Pregnancy If you are about to stop having your period (premenopausal) and you may become pregnant, seek counseling before you get pregnant. Take 400 to 800 micrograms (mcg) of folic acid  every day if you become pregnant. Ask for birth control (contraception) if you want to prevent pregnancy. Osteoporosis and menopause Osteoporosis is a disease in which the bones lose minerals and strength with aging. This can result in bone fractures. If you are 68 years old or older, or if you are at risk for osteoporosis and fractures, ask your health care provider if you should: Be screened for bone loss. Take a calcium  or vitamin D  supplement to lower your risk of fractures. Be given hormone replacement therapy (HRT) to treat symptoms of menopause. Follow these instructions at home: Alcohol use Do not drink alcohol if: Your health care provider tells you not to drink. You are pregnant, may be pregnant, or are planning to become pregnant. If you drink alcohol: Limit how much you have to: 0-1 drink a day. Know how much alcohol is in your drink. In the [place] one drink equals one 12 oz bottle of beer (355 mL), one 5 oz glass of wine (148 mL), or one 1 oz glass of hard liquor (44 mL). Lifestyle Do not use any products that contain nicotine or tobacco. These products include cigarettes, chewing tobacco, and vaping devices, such as e-cigarettes. If you need help quitting, ask your health care provider. Do not use street drugs. Do not share needles. Ask your health care provider for help if you need support or information about quitting drugs. General instructions Schedule regular health, dental, and eye exams. Stay current with your vaccines. Tell your health care provider if: You often feel depressed. You have ever been abused or do not feel safe at home. Summary Adopting a healthy lifestyle  and getting preventive care are important in promoting health and wellness. Follow your health care provider's instructions about healthy diet, exercising, and getting tested or screened for diseases. Follow your health care provider's instructions on monitoring your cholesterol and blood pressure. This information is not intended to replace advice given to you by your health care provider. Make sure you discuss any questions you have with your health care provider. Document Revised: 10/15/2020 Document Reviewed: 10/15/2020 Elsevier Patient Education  2024 [employer].

## 2024-02-22 ENCOUNTER — Ambulatory Visit: Payer: Self-pay | Admitting: Internal Medicine

## 2024-02-22 ENCOUNTER — Ambulatory Visit: Admitting: Internal Medicine

## 2024-02-22 VITALS — BP 126/80 | HR 81 | Temp 98.3°F | Ht 61.0 in | Wt 149.0 lb

## 2024-02-22 DIAGNOSIS — E559 Vitamin D deficiency, unspecified: Secondary | ICD-10-CM | POA: Diagnosis not present

## 2024-02-22 DIAGNOSIS — Z23 Encounter for immunization: Secondary | ICD-10-CM

## 2024-02-22 DIAGNOSIS — R7303 Prediabetes: Secondary | ICD-10-CM | POA: Diagnosis not present

## 2024-02-22 DIAGNOSIS — K219 Gastro-esophageal reflux disease without esophagitis: Secondary | ICD-10-CM

## 2024-02-22 DIAGNOSIS — Z0000 Encounter for general adult medical examination without abnormal findings: Secondary | ICD-10-CM

## 2024-02-22 DIAGNOSIS — G309 Alzheimer's disease, unspecified: Secondary | ICD-10-CM | POA: Diagnosis not present

## 2024-02-22 DIAGNOSIS — M8589 Other specified disorders of bone density and structure, multiple sites: Secondary | ICD-10-CM | POA: Diagnosis not present

## 2024-02-22 DIAGNOSIS — E7849 Other hyperlipidemia: Secondary | ICD-10-CM

## 2024-02-22 DIAGNOSIS — G3184 Mild cognitive impairment, so stated: Secondary | ICD-10-CM | POA: Diagnosis not present

## 2024-02-22 DIAGNOSIS — I10 Essential (primary) hypertension: Secondary | ICD-10-CM

## 2024-02-22 LAB — LIPID PANEL
Cholesterol: 190 mg/dL (ref 0–200)
HDL: 77.1 mg/dL (ref >39.00)
LDL Cholesterol: 98 mg/dL (ref 0–99)
NonHDL: 112.83
Total CHOL/HDL Ratio: 2
Triglycerides: 76 mg/dL (ref 0.0–149.0)
VLDL: 15.2 mg/dL (ref 0.0–40.0)

## 2024-02-22 LAB — COMPREHENSIVE METABOLIC PANEL WITH GFR
ALT: 19 U/L (ref 0–35)
AST: 21 U/L (ref 0–37)
Albumin: 3.9 g/dL (ref 3.5–5.2)
Alkaline Phosphatase: 88 U/L (ref 39–117)
BUN: 15 mg/dL (ref 6–23)
CO2: 28 meq/L (ref 19–32)
Calcium: 10.6 mg/dL — ABNORMAL HIGH (ref 8.4–10.5)
Chloride: 106 meq/L (ref 96–112)
Creatinine, Ser: 0.95 mg/dL (ref 0.40–1.20)
GFR: 58.88 mL/min — ABNORMAL LOW (ref >60.00)
Glucose, Bld: 102 mg/dL — ABNORMAL HIGH (ref 70–99)
Potassium: 4 meq/L (ref 3.5–5.1)
Sodium: 142 meq/L (ref 135–145)
Total Bilirubin: 0.9 mg/dL (ref 0.2–1.2)
Total Protein: 7 g/dL (ref 6.0–8.3)

## 2024-02-22 LAB — CBC
HCT: 40.5 % (ref 36.0–46.0)
Hemoglobin: 12.9 g/dL (ref 12.0–15.0)
MCHC: 31.8 g/dL (ref 30.0–36.0)
MCV: 73.7 fl — ABNORMAL LOW (ref 78.0–100.0)
Platelets: 207 K/uL (ref 150.0–400.0)
RBC: 5.49 Mil/uL — ABNORMAL HIGH (ref 3.87–5.11)
RDW: 15.7 % — ABNORMAL HIGH (ref 11.5–15.5)
WBC: 6.3 K/uL (ref 4.0–10.5)

## 2024-02-22 LAB — VITAMIN D 25 HYDROXY (VIT D DEFICIENCY, FRACTURES): VITD: 22.02 ng/mL — ABNORMAL LOW (ref 30.00–100.00)

## 2024-02-22 LAB — TSH: TSH: 1.48 u[IU]/mL (ref 0.35–5.50)

## 2024-02-22 LAB — HEMOGLOBIN A1C: Hgb A1c MFr Bld: 5.6 % (ref 4.6–6.5)

## 2024-02-22 MED ORDER — ATORVASTATIN CALCIUM 20 MG PO TABS: 20.0000 mg | ORAL_TABLET | Freq: Every day | ORAL | 3 refills | Status: AC

## 2024-02-22 MED ORDER — POTASSIUM CHLORIDE CRYS ER 20 MEQ PO TBCR: 20.0000 meq | EXTENDED_RELEASE_TABLET | Freq: Every day | ORAL | 3 refills | Status: AC

## 2024-02-22 NOTE — Assessment & Plan Note (Signed)
 Chronic Blood pressure controlled CBC, CMP Continue amlodipine 10 mg daily, atenolol 25 mg daily

## 2024-02-22 NOTE — Assessment & Plan Note (Addendum)
 Chronic Following with neurology-Sara Wertman On Namenda  10 mg nightly Son is here with her and feels her short-term memory has gotten worse and on wonders what else can be done Has follow-up with neurology in December Continue regular exercise Continue reading, puzzles Mood is good-denies any depression or anxiety Only driving short distances, but does have an air tag in her car We discussed that neurology will reevaluate her and plans on considering another neuropsych evaluation to reevaluate diagnosis and prognosis Can discuss if additional medication would help her with neurology

## 2024-02-22 NOTE — Assessment & Plan Note (Addendum)
Chronic ?DEXA up-to-date ?Continue regular exercise ?Check vitamin D level ?

## 2024-02-22 NOTE — Assessment & Plan Note (Signed)
 Chronic Taking vitamin d daily Check vitamin d level

## 2024-02-22 NOTE — Assessment & Plan Note (Signed)
 Chronic Lab Results  Component Value Date   HGBA1C 5.8 08/18/2023   Check A1c Low sugar / carb diet Stressed regular exercise

## 2024-02-22 NOTE — Assessment & Plan Note (Signed)
 Chronic Lab Results  Component Value Date   LDLCALC 66 08/18/2023   Lipids controlled Check lipid panel Encouraged healthy diet and regular exercise Continue atorvastatin  20 mg daily

## 2024-02-22 NOTE — Assessment & Plan Note (Addendum)
 Chronic Controlled-denies any GERD Not sure if she has been taking her medications on a regular basis Recommended holding medication use medication only if needed If GERD recurs and is frequent may need to consider daily medication again

## 2024-03-13 ENCOUNTER — Other Ambulatory Visit: Payer: Self-pay | Admitting: Internal Medicine

## 2024-04-11 ENCOUNTER — Encounter: Payer: Self-pay | Admitting: Pharmacist

## 2024-04-11 NOTE — Progress Notes (Signed)
 Pharmacy Quality Measure Review  This patient is appearing on a report for being at risk of failing the adherence measure for cholesterol (statin) medications this calendar year.   Medication: Atorvastatin  Last fill date: 04/03/24 for 90 day supply  Insurance report was not up to date. No action needed at this time.   Darrelyn Drum, PharmD, BCPS, CPP Clinical Pharmacist Practitioner [facility] [phone]

## 2024-04-29 ENCOUNTER — Telehealth: Payer: Self-pay

## 2024-04-29 NOTE — Telephone Encounter (Signed)
 Copied from CRM #[id number]. Topic: Clinical - Medication Question >> Apr 29, 2024 10:39 AM Carlyon D wrote: Reason for CRM: Pt is calling in regards to needing a printed out list of all her medications that she is currently taking, the pharmacist at [facility] is requesting this list of medications.  Pt is asking if it can be printed out and mailed to her home address please.

## 2024-05-02 NOTE — Telephone Encounter (Signed)
 Mailed out today

## 2024-05-11 ENCOUNTER — Ambulatory Visit: Admitting: Physician Assistant

## 2024-05-11 ENCOUNTER — Encounter: Payer: Self-pay | Admitting: Physician Assistant

## 2024-05-11 VITALS — BP 152/78 | HR 80 | Resp 20 | Ht 61.5 in | Wt 164.0 lb

## 2024-05-11 DIAGNOSIS — G309 Alzheimer's disease, unspecified: Secondary | ICD-10-CM

## 2024-05-11 DIAGNOSIS — G3184 Mild cognitive impairment, so stated: Secondary | ICD-10-CM

## 2024-05-11 DIAGNOSIS — Z87898 Personal history of other specified conditions: Secondary | ICD-10-CM

## 2024-05-11 MED ORDER — MEMANTINE HCL 10 MG PO TABS: 10.0000 mg | ORAL_TABLET | Freq: Two times a day (BID) | ORAL | 11 refills | Status: AC

## 2024-05-11 NOTE — Patient Instructions (Signed)
 It was a pleasure to see you today at our office.   Recommendations:   Follow up in 6 months   Continue Memantine  10 mg twice a day   Consider [facility]  [address], [place] [address] [phone]  Hours of Operation Mondays to Thursdays: 8 am to 8 pm,Fridays: 9 am to 8 pm, Saturdays: 9 am to 1 pm Sundays: Closed  [email or web address]    For psychiatric meds, mood meds: Please have your primary care physician manage these medications.  If you have any severe symptoms of a stroke, or other severe issues such as confusion,severe chills or fever, etc call 911 or go to the ER as you may need to be evaluated further  For guidance regarding [facility] and if placement were needed at the facility, contact Social Worker tel: [phone]  For assessment of decision of mental capacity and competency:  Call Dr. Rosaline Nine, geriatric psychiatrist at [phone]  Counseling regarding caregiver distress, including caregiver depression, anxiety and issues regarding community resources, adult day care programs, adult living facilities, or memory care questions:  please contact your  Primary Doctor's Social Worker   Whom to call: Memory  decline, memory medications: Call our office [phone]    [email or web address]   RECOMMENDATIONS FOR ALL PATIENTS WITH MEMORY PROBLEMS: 1. Continue to exercise (Recommend 30 minutes of walking everyday, or 3 hours every week) 2. Increase social interactions - continue going to [place] and enjoy social gatherings with friends and family 3. Eat healthy, avoid fried foods and eat more fruits and vegetables 4. Maintain adequate blood pressure, blood sugar, and blood cholesterol level. Reducing the risk of stroke and cardiovascular disease also helps promoting better memory. 5. Avoid  stressful situations. Live a simple life and avoid aggravations. Organize your time and prepare for the next day in anticipation. 6. Sleep well, avoid any interruptions of sleep and avoid any distractions in the bedroom that may interfere with adequate sleep quality 7. Avoid sugar, avoid sweets as there is a strong link between excessive sugar intake, diabetes, and cognitive impairment We discussed the Mediterranean diet, which has been shown to help patients reduce the risk of progressive memory disorders and reduces cardiovascular risk. This includes eating fish, eat fruits and green leafy vegetables, nuts like almonds and hazelnuts, walnuts, and also use olive oil. Avoid fast foods and fried foods as much as possible. Avoid sweets and sugar as sugar use has been linked to worsening of memory function.  There is always a concern of gradual progression of memory problems. If this is the case, then we may need to adjust level of care according to patient needs. Support, both to the patient and caregiver, should then be put into place.      You have been referred for a neuropsychological evaluation (i.e., evaluation of memory and thinking abilities). Please bring someone with you to this appointment if possible, as it is helpful for the doctor to hear from both you and another adult who knows you well. Please bring eyeglasses and hearing aids if you wear them.    The evaluation will take approximately 3 hours and has two parts:   The first part is a clinical interview with the neuropsychologist (Dr. Richie or Dr. Jackquline). During the interview, the neuropsychologist will speak with you and the individual you brought to the appointment.    The second part of the evaluation is testing with the doctor's technician Neal or Luke). During the testing, the  technician will ask you to remember different types of material, solve problems, and answer some questionnaires. Your family member will not be present for  this portion of the evaluation.   Please note: We must reserve several hours of the neuropsychologist's time and the psychometrician's time for your evaluation appointment. As such, there is a No-Show fee of $100. If you are unable to attend any of your appointments, please contact our office as soon as possible to reschedule.      DRIVING: Regarding driving, in patients with progressive memory problems, driving will be impaired. We advise to have someone else do the driving if trouble finding directions or if minor accidents are reported. Independent driving assessment is available to determine safety of driving.   If you are interested in the driving assessment, you can contact the following:  [facility] [employer] in [place] [phone]  [facility] [phone]  [facility] [phone]  [facility] [phone] or [phone]   FALL PRECAUTIONS: Be cautious when walking. Scan the area for obstacles that may increase the risk of trips and falls. When getting up in the mornings, sit up at the edge of the bed for a few minutes before getting out of bed. Consider elevating the bed at the head end to avoid drop of blood pressure when getting up. Walk always in a well-lit room (use night lights in the walls). Avoid area rugs or power cords from appliances in the middle of the walkways. Use a walker or a cane if necessary and consider physical therapy for balance exercise. Get your eyesight checked regularly.  FINANCIAL OVERSIGHT: Supervision, especially oversight when making financial decisions or transactions is also recommended.  HOME SAFETY: Consider the safety of the kitchen when operating appliances like stoves, microwave oven, and blender. Consider having supervision and share cooking responsibilities until no longer able to participate in those. Accidents with firearms and other hazards in the house should be identified and addressed as  well.   ABILITY TO BE LEFT ALONE: If patient is unable to contact 911 operator, consider using LifeLine, or when the need is there, arrange for someone to stay with patients. Smoking is a fire hazard, consider supervision or cessation. Risk of wandering should be assessed by caregiver and if detected at any point, supervision and safe proof recommendations should be instituted.  MEDICATION SUPERVISION: Inability to self-administer medication needs to be constantly addressed. Implement a mechanism to ensure safe administration of the medications.      Mediterranean Diet A Mediterranean diet refers to food and lifestyle choices that are based on the traditions of countries located on the [employer]. This way of eating has been shown to help prevent certain conditions and improve outcomes for people who have chronic diseases, like kidney disease and heart disease. What are tips for following this plan? Lifestyle  Cook and eat meals together with your family, when possible. Drink enough fluid to keep your urine clear or pale yellow. Be physically active every day. This includes: Aerobic exercise like running or swimming. Leisure activities like gardening, walking, or housework. Get 7-8 hours of sleep each night. If recommended by your health care provider, drink red wine in moderation. This means 1 glass a day for nonpregnant women and 2 glasses a day for men. A glass of wine equals 5 oz (150 mL). Reading food labels  Check the serving size of packaged foods. For foods such as rice and pasta, the serving size refers to the amount of cooked  product, not dry. Check the total fat in packaged foods. Avoid foods that have saturated fat or trans fats. Check the ingredients list for added sugars, such as corn syrup. Shopping  At the grocery store, buy most of your food from the areas near the walls of the store. This includes: Fresh fruits and vegetables (produce). Grains, beans, nuts, and  seeds. Some of these may be available in unpackaged forms or large amounts (in bulk). Fresh seafood. Poultry and eggs. Low-fat dairy products. Buy whole ingredients instead of prepackaged foods. Buy fresh fruits and vegetables in-season from local farmers markets. Buy frozen fruits and vegetables in resealable bags. If you do not have access to quality fresh seafood, buy precooked frozen shrimp or canned fish, such as tuna, salmon, or sardines. Buy small amounts of raw or cooked vegetables, salads, or olives from the deli or salad bar at your store. Stock your pantry so you always have certain foods on hand, such as olive oil, canned tuna, canned tomatoes, rice, pasta, and beans. Cooking  Cook foods with extra-virgin olive oil instead of using butter or other vegetable oils. Have meat as a side dish, and have vegetables or grains as your main dish. This means having meat in small portions or adding small amounts of meat to foods like pasta or stew. Use beans or vegetables instead of meat in common dishes like chili or lasagna. Experiment with different cooking methods. Try roasting or broiling vegetables instead of steaming or sauteing them. Add frozen vegetables to soups, stews, pasta, or rice. Add nuts or seeds for added healthy fat at each meal. You can add these to yogurt, salads, or vegetable dishes. Marinate fish or vegetables using olive oil, lemon juice, garlic, and fresh herbs. Meal planning  Plan to eat 1 vegetarian meal one day each week. Try to work up to 2 vegetarian meals, if possible. Eat seafood 2 or more times a week. Have healthy snacks readily available, such as: Vegetable sticks with hummus. Greek yogurt. Fruit and nut trail mix. Eat balanced meals throughout the week. This includes: Fruit: 2-3 servings a day Vegetables: 4-5 servings a day Low-fat dairy: 2 servings a day Fish, poultry, or lean meat: 1 serving a day Beans and legumes: 2 or more servings a week Nuts  and seeds: 1-2 servings a day Whole grains: 6-8 servings a day Extra-virgin olive oil: 3-4 servings a day Limit red meat and sweets to only a few servings a month What are my food choices? Mediterranean diet Recommended Grains: Whole-grain pasta. Brown rice. Bulgar wheat. Polenta. Couscous. Whole-wheat bread. [place] Made[place]. Vegetables: Artichokes. Beets. Broccoli. Cabbage. Carrots. Eggplant. Green beans. Chard. Kale. Spinach. Onions. Leeks. Peas. Squash. Tomatoes. Peppers. Radishes. Fruits: Apples. Apricots. Avocado. Berries. Bananas. Cherries. Dates. Figs. Grapes. Lemons. Melon. Oranges. Peaches. Plums. Pomegranate. Meats and other protein foods: Beans. Almonds. Sunflower seeds. Pine nuts. Peanuts. Cod. Salmon. Scallops. Shrimp. Tuna. Tilapia. Clams. Oysters. Eggs. Dairy: Low-fat milk. Cheese. Greek yogurt. Beverages: Water. Red wine. Herbal tea. Fats and oils: Extra virgin olive oil. Avocado oil. Grape seed oil. Sweets and desserts: Greek yogurt with honey. Baked apples. Poached pears. Trail mix. Seasoning and other foods: Basil. Cilantro. Coriander. Cumin. Mint. Parsley. Sage. Rosemary. Tarragon. Garlic. Oregano. Thyme. Pepper. Balsalmic vinegar. Tahini. Hummus. Tomato sauce. Olives. Mushrooms. Limit these Grains: Prepackaged pasta or rice dishes. Prepackaged cereal with added sugar. Vegetables: Deep fried potatoes (french fries). Fruits: Fruit canned in syrup. Meats and other protein foods: Beef. Pork. Lamb. Poultry with skin. Hot dogs. [place].  Dairy: Ice cream. Sour cream. Whole milk. Beverages: Juice. Sugar-sweetened soft drinks. Beer. Liquor and spirits. Fats and oils: Butter. Canola oil. Vegetable oil. Beef fat (tallow). Lard. Sweets and desserts: Cookies. Cakes. Pies. Candy. Seasoning and other foods: Mayonnaise. Premade sauces and marinades. The items listed may not be a complete list. Talk with your dietitian about what dietary choices are right for you. Summary The  Mediterranean diet includes both food and lifestyle choices. Eat a variety of fresh fruits and vegetables, beans, nuts, seeds, and whole grains. Limit the amount of red meat and sweets that you eat. Talk with your health care provider about whether it is safe for you to drink red wine in moderation. This means 1 glass a day for nonpregnant women and 2 glasses a day for men. A glass of wine equals 5 oz (150 mL). This information is not intended to replace advice given to you by your health care provider. Make sure you discuss any questions you have with your health care provider. Document Released: 01/17/2016 Document Revised: 02/19/2016 Document Reviewed: 01/17/2016 Elsevier Interactive Patient Education  2017 [employer].

## 2024-05-11 NOTE — Progress Notes (Signed)
 Brooke [name] is a very pleasant 73 y.o. RH female with a history of hypertension, hyperlipidemia, anxiety, situational depression, arthritis, CAD, prediabetes, GERD, microcytic anemia, insomnia and a diagnosis of MCI due to Alzheimer's disease per neuropsychological evaluation presenting today in follow-up for evaluation of memory loss. Patient is on memantine  10 mg daily (noncompliant with the medication). This patient is accompanied in the office by her son who supplements the history. Previous records as well as any outside records available were reviewed prior to todays visit.   Patient was last seen on 11/05/2023. Mild decline I noted. MMSE today is  22/30. Patient is able to participate on ADLs and to drive without difficulties. Mood is depressed, discussed increasing socialization and considering psychotherapy for situational depression.      Assessment & Plan Mild cognitive impairment due to Alzheimer's disease Reports worsening memory, particularly with directions and short-term memory. No significant changes in long-term memory. Discussed potential addition of Aricept, pending cardiology clearance due to history of prolonged QT interval. Emphasized importance of medication adherence to slow memory loss. Discussed lifestyle modifications including exercise, socialization, and dietary changes to support cognitive health.  - Continue memantine  10 mg twice daily. - Encouraged continuation of current activities including exercise, socialization, and intellectual stimulation. - Recommended Mediterranean diet for cognitive health. - Referred to cardiology for clearance to start Aricept due to history of prolonged QT interval. - Advised monitoring of driving ability and use of GPS or similar technology if needed. Repeat neuropsych evaluation after her next visit      Discussed the use of AI scribe software for clinical note transcription with the patient, who gave verbal consent to  proceed.  History of Present Illness Brooke [name] is a 73 year old female with mild cognitive impairment due to Alzheimer's disease who presents with worsening memory.   She experiences worsening memory, particularly with recalling directions and making decisions while driving. She drives less frequently and prefers familiar routes to avoid getting lost. Short-term memory is more affected than long-term memory, and she sometimes forgets grocery items without a list. She occasionally repeats herself.  She engages in activities such as lexicographer, attending church, doing crossword puzzles, and [facility]. She lives alone and her son gives her medications every morning and afternoon.  No disorientation at home, wandering tendencies, personality changes, mood changes, hallucinations, paranoia, or seizures. She sleeps well, though sometimes stays up late to watch TV. No vivid dreams or nightmares.  She mentions a decreased appetite, attributing it to living alone and not preparing meals as she did when her husband was alive. She ensures she drinks water, especially when [facility].  No falls, head injuries, stroke symptoms, tremors, headaches, vision changes, urinary incontinence, or bowel issues.    Initial visit 02/11/2023 How long did patient have memory difficulties?  She denies any difficulties at this time.  In the past, about 1 year ago he reported having some forgetfulness, but now she feels that she is at her normal.  Her children believe that her memory is not good. She writes her grocery list not to forget the items.  She forgets her son's schedules If I come from my second shift she asks why am I at home, I should be at work. She likes going to [employer]. She loves crossword puzzles.  repeats oneself?  Endorsed Disoriented when walking into a room?  Patient denies   Leaving objects  in unusual places?  Her sons are  concerned that she loses things more frequently especially the keys.  Wandering behavior? Denies.   Any personality changes ? Denies.   Any history of depression?:  She has a history of depression and anxiety  Hallucinations or paranoia? Denies.   Seizures? denies    Any sleep changes?  Sleeps well. Denies vivid dreams, REM behavior or sleepwalking   Sleep apnea? Denies.   Any hygiene concerns?  Denies.   Independent of bathing and dressing? Endorsed  Does the patient need help with medications?  Patient is in charge   Who is in charge of the finances? Patient is in charge     Any changes in appetite?  Appetite is decreased I am the only one there to eat    Patient have trouble swallowing?  denies   Does the patient cook? No.  Any headaches?  denies   Chronic pain? denies   Ambulates with difficulty? Denies.   Recent falls or head injuries? denies     Vision changes? Unilateral weakness, numbness or tingling? denies   Any tremors? Denies.   Any anosmia? Denies.   Any incontinence of urine? Denies.   Any bowel dysfunction? Denies.      Patient lives alone.  History of heavy alcohol intake? Denies.   History of heavy tobacco use? Denies.   Family history of dementia? Patient denies  Does patient drive? Yes, she denies any issues, but son reports that coming here she got lost. 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      MRI of the brain personally reviewed, remarkable for bifrontal superficial siderosis, greater on the right. No specificity to the pattern and mild for age cerebral volume loss      Neuropsych evaluation 04/13/2023 briefly, results suggested severe impairment surrounding essentially all aspects of learning and memory. Additional impairments were exhibited across executive functioning, receptive language, semantic fluency, and confrontation naming. Performance variability was exhibited across processing speed and attention/concentration. Regarding the cause for  ongoing cognitive impairment, my primary concerns surround Alzheimer's disease. Across memory testing, Brooke [name] did not benefit from repeated exposure to novel information, was fully amnestic (i.e., 0% retention) across all memory tasks after a brief delay, and generally performed poorly across yes/no recognition trials. Taken together, this suggests evidence for rapid forgetting and an evolving and already fairly significant storage impairment, both of which are the hallmark testing patterns of Alzheimer's disease. Additional impairments surrounding semantic fluency, confrontation naming, and executive functioning would follow typical and expected disease progression. This would appear the most likely explanation for her pattern of deficits at the present time.   Past Medical History:  Diagnosis Date   Allergic rhinitis 02/22/2007   Eczema 03/25/2021   Essential hypertension 02/18/2007   GERD (gastroesophageal reflux disease) 06/01/2019   Hyperlipidemia 07/22/2007   Insomnia 04/15/2016   Trazodone  not effective  Mirtazapine   15 mg not effective     Lipoma of chest wall 07/23/2022   Major depressive disorder 09/04/2020   Microcytic anemia 10/12/2013   Electrophoresis normal     Mild cognitive impairment due to Alzheimer's disease 04/13/2023   Obesity    Osteoarthritis 03/30/2008   Osteopenia 08/25/2022   08/25/2022 spine -1.5, RFN -1.4, FRAX is low     Prediabetes 05/07/2015     Past Surgical History:  Procedure Laterality Date   CARPAL TUNNEL RELEASE     bilateral   HIP SURGERY        Results  DIAGNOSTIC EKG: QT prolongation (2022)     Objective:     PHYSICAL EXAMINATION:    VITALS:   Vitals:   05/11/24 1116  BP: (!) 152/78  Pulse: 80  Resp: 20  SpO2: 99%  Weight: 164 lb (74.4 kg)  Height: 5' 1.5 (1.562 m)    GEN:  The patient appears stated age and is in NAD. HEENT:  Normocephalic, atraumatic.   Neurological examination:  General: NAD, well-groomed,  appears stated age. Orientation: The patient is alert. Oriented to person, place and not to date.  Cranial nerves: There is good facial symmetry. Tearful appearing. The speech is fluent and clear. No aphasia or dysarthria. Fund of knowledge is appropriate. Recent  and remote memory impaired.  Attention and concentration are normal.  Able to name objects and repeat phrases.  Hearing is intact to conversational tone . Delayed recall 0/3 Sensation: Sensation is intact to light touch throughout Motor: Strength is at least antigravity x4. DTR's 2/4 in UE/LE      02/11/2023   11:00 AM  Montreal Cognitive Assessment   Visuospatial/ Executive (0/5) 3  Naming (0/3) 3  Attention: Read list of digits (0/2) 2  Attention: Read list of letters (0/1) 1  Attention: Serial 7 subtraction starting at 100 (0/3) 1  Language: Repeat phrase (0/2) 1  Language : Fluency (0/1) 0  Abstraction (0/2) 0  Delayed Recall (0/5) 0  Orientation (0/6) 3  Total 14  Adjusted Score (based on education) 15       05/11/2024    6:00 PM 08/21/2016    3:01 PM  MMSE - Mini Mental State Exam  Not completed:  Refused  Orientation to time 2   Orientation to Place 3   Registration 3   Attention/ Calculation 5   Recall 0   Language- name 2 objects 2   Language- repeat 1   Language- follow 3 step command 3   Language- read & follow direction 1   Write a sentence 1   Copy design 1   Total score 22          Movement examination: Tone: There is normal tone in the UE/LE Abnormal movements:  no tremor.  No myoclonus.  No asterixis.   Coordination:  There is no decremation with RAM's. Normal finger to nose  Gait and Station: The patient has no difficulty arising out of a deep-seated chair without the use of the hands. The patient's stride length is good.  Gait is cautious and narrow.   Thank you for allowing us  the opportunity to participate in the care of this nice patient. Please do not hesitate to contact us  for any  questions or concerns.   Total time spent on today's visit was 36 minutes dedicated to this patient today, preparing to see patient, examining the patient, ordering tests and/or medications and counseling the patient, documenting clinical information in the EHR or other health record, independently interpreting results and communicating results to the patient/family, discussing treatment and goals, answering patient's questions and coordinating care.  Cc:  Geofm Glade PARAS, MD  Camie Sevin 05/11/2024 6:47 PM

## 2024-05-16 ENCOUNTER — Inpatient Hospital Stay: Admission: RE | Admit: 2024-05-16 | Discharge: 2024-05-16 | Attending: Internal Medicine

## 2024-05-16 DIAGNOSIS — R921 Mammographic calcification found on diagnostic imaging of breast: Secondary | ICD-10-CM

## 2024-06-03 ENCOUNTER — Telehealth: Payer: Self-pay | Admitting: Pharmacist

## 2024-06-03 NOTE — Progress Notes (Signed)
 Pharmacy Quality Measure Review  This patient is appearing on a report for being at risk of failing the adherence measure for cholesterol (statin) medications this calendar year.   Medication: Atorvastatin  Last fill date: 01/07/24 for 90 day supply  Contacted pharmacy to facilitate refills.  Darrelyn Drum, PharmD, BCPS, CPP Clinical Pharmacist Practitioner [facility] [phone]

## 2024-07-12 ENCOUNTER — Ambulatory Visit (HOSPITAL_COMMUNITY)
Admission: EM | Admit: 2024-07-12 | Discharge: 2024-07-12 | Disposition: A | Discharge status: Home / Self Care | Payer: Medicare (Managed Care) | Source: Home / Self Care

## 2024-07-12 ENCOUNTER — Encounter (HOSPITAL_COMMUNITY): Payer: Self-pay

## 2024-07-12 DIAGNOSIS — G309 Alzheimer's disease, unspecified: Secondary | ICD-10-CM

## 2024-07-12 DIAGNOSIS — G3184 Mild cognitive impairment, so stated: Secondary | ICD-10-CM

## 2024-07-12 LAB — POCT URINALYSIS DIP (MANUAL ENTRY)
Bilirubin, UA: NEGATIVE
Glucose, UA: NEGATIVE mg/dL
Ketones, POC UA: NEGATIVE mg/dL
Leukocytes, UA: NEGATIVE
Nitrite, UA: NEGATIVE
Protein Ur, POC: >=300 mg/dL — AB
Spec Grav, UA: >=1.03 — AB
Urobilinogen, UA: 0.2 U/dL
pH, UA: 6

## 2024-07-14 ENCOUNTER — Ambulatory Visit (HOSPITAL_COMMUNITY): Payer: Self-pay | Admitting: Nurse Practitioner

## 2024-07-14 LAB — URINE CULTURE: Culture: >=100000 — AB

## 2024-07-14 MED ORDER — FOSFOMYCIN TROMETHAMINE 3 G PO PACK: 3.0000 g | PACK | Freq: Once | ORAL | 0 refills | Status: AC

## 2024-11-09 ENCOUNTER — Ambulatory Visit: Admitting: Physician Assistant
